# Patient Record
Sex: Male | Born: 1960 | Race: White | Hispanic: No | Marital: Married | Smoking: Never smoker
Health system: Southern US, Community
[De-identification: ages and names within clinical notes are randomized; demographics above are authoritative.]

## PROBLEM LIST (undated history)

## (undated) DIAGNOSIS — Z8616 Personal history of COVID-19: Secondary | ICD-10-CM

## (undated) DIAGNOSIS — I4891 Unspecified atrial fibrillation: Secondary | ICD-10-CM

## (undated) DIAGNOSIS — I10 Essential (primary) hypertension: Secondary | ICD-10-CM

## (undated) DIAGNOSIS — E119 Type 2 diabetes mellitus without complications: Secondary | ICD-10-CM

## (undated) DIAGNOSIS — F419 Anxiety disorder, unspecified: Secondary | ICD-10-CM

## (undated) HISTORY — DX: Personal history of COVID-19: Z86.16

## (undated) HISTORY — DX: Unspecified atrial fibrillation: I48.91

## (undated) HISTORY — DX: Essential (primary) hypertension: I10

## (undated) HISTORY — DX: Type 2 diabetes mellitus without complications: E11.9

---

## 2012-11-02 ENCOUNTER — Ambulatory Visit: Payer: Self-pay | Admitting: *Deleted

## 2012-11-30 ENCOUNTER — Ambulatory Visit: Payer: Self-pay | Admitting: *Deleted

## 2013-01-11 ENCOUNTER — Ambulatory Visit: Payer: Self-pay | Admitting: *Deleted

## 2016-04-18 ENCOUNTER — Other Ambulatory Visit: Payer: Self-pay | Admitting: Gastroenterology

## 2016-04-18 DIAGNOSIS — R945 Abnormal results of liver function studies: Principal | ICD-10-CM

## 2016-04-18 DIAGNOSIS — R7989 Other specified abnormal findings of blood chemistry: Secondary | ICD-10-CM

## 2016-04-22 ENCOUNTER — Ambulatory Visit
Admission: RE | Admit: 2016-04-22 | Discharge: 2016-04-22 | Disposition: A | Discharge status: Home / Self Care | Payer: BLUE CROSS/BLUE SHIELD | Source: Ambulatory Visit | Attending: Gastroenterology | Admitting: Gastroenterology

## 2016-04-22 DIAGNOSIS — R7989 Other specified abnormal findings of blood chemistry: Secondary | ICD-10-CM

## 2016-04-22 DIAGNOSIS — R945 Abnormal results of liver function studies: Principal | ICD-10-CM

## 2016-11-14 ENCOUNTER — Emergency Department (HOSPITAL_BASED_OUTPATIENT_CLINIC_OR_DEPARTMENT_OTHER)
Admission: EM | Admit: 2016-11-14 | Discharge: 2016-11-14 | Disposition: A | Discharge status: Home / Self Care | Payer: BLUE CROSS/BLUE SHIELD | Attending: Emergency Medicine | Admitting: Emergency Medicine

## 2016-11-14 ENCOUNTER — Encounter (HOSPITAL_BASED_OUTPATIENT_CLINIC_OR_DEPARTMENT_OTHER): Payer: Self-pay | Admitting: *Deleted

## 2016-11-14 ENCOUNTER — Emergency Department (HOSPITAL_BASED_OUTPATIENT_CLINIC_OR_DEPARTMENT_OTHER): Payer: BLUE CROSS/BLUE SHIELD

## 2016-11-14 DIAGNOSIS — R079 Chest pain, unspecified: Secondary | ICD-10-CM | POA: Diagnosis not present

## 2016-11-14 DIAGNOSIS — Z79899 Other long term (current) drug therapy: Secondary | ICD-10-CM | POA: Diagnosis not present

## 2016-11-14 HISTORY — DX: Anxiety disorder, unspecified: F41.9

## 2016-11-14 LAB — CBC
HCT: 42.3 % (ref 39.0–52.0)
Hemoglobin: 14.1 g/dL (ref 13.0–17.0)
MCH: 30.1 pg (ref 26.0–34.0)
MCHC: 33.3 g/dL (ref 30.0–36.0)
MCV: 90.4 fL (ref 78.0–100.0)
PLATELETS: 208 10*3/uL (ref 150–400)
RBC: 4.68 MIL/uL (ref 4.22–5.81)
RDW: 13.9 % (ref 11.5–15.5)
WBC: 7.5 10*3/uL (ref 4.0–10.5)

## 2016-11-14 LAB — BASIC METABOLIC PANEL
ANION GAP: 7 (ref 5–15)
BUN: 20 mg/dL (ref 6–20)
CALCIUM: 9.4 mg/dL (ref 8.9–10.3)
CO2: 27 mmol/L (ref 22–32)
CREATININE: 0.9 mg/dL (ref 0.61–1.24)
Chloride: 106 mmol/L (ref 101–111)
GFR calc Af Amer: >60 mL/min (ref >60)
GLUCOSE: 105 mg/dL — AB (ref 65–99)
Potassium: 4.4 mmol/L (ref 3.5–5.1)
Sodium: 140 mmol/L (ref 135–145)

## 2016-11-14 LAB — TROPONIN I

## 2016-11-14 NOTE — ED Notes (Signed)
ED Provider at bedside. 

## 2016-11-14 NOTE — Discharge Instructions (Signed)
You were seen in the emergency room today for evaluation of chest pain. Your workup including bloodwork, chest x-ray, and EKG were unremarkable. Please follow up with your primary care provider and cardiologist. Return to the emergency room for new or worsening symptoms.

## 2016-11-14 NOTE — ED Triage Notes (Signed)
Pt c/o central chest pain x 1 day, denies SOB nausea

## 2016-11-14 NOTE — ED Provider Notes (Signed)
[facility] Provider Note   CSN: [id number] Arrival date & time: 11/14/16  1911  By signing my name below, I, [name], attest that this documentation has been prepared under the direction and in the presence of KeyCorpSerena Aleksandra Raben PA-C.  Electronically Signed: [name], ED Scribe. 11/14/16. 8:43 PM.   History   Chief Complaint Chief Complaint  Patient presents with  . Chest Pain    HPI HPI Comments: Alejandro Bowen is a 55 y.o. male with Hx of anxiety, HLD, who presents to the Emergency Department complaining of central chest pain that started approximately four hours ago. Pt states that his chest pain started while he was leaving work. 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 Pt states that the chest pain has subsided. He has had these pain intermittently in the past. Pt has had a negative stress test done with Dr. Andrey CampanileWilson with [facility] earlier this year. Pt had concerns for his blood pressure when taken by EMS was 176/76. BP has now improved. No shortness of breath during chest pain episode. He does not currently take medication for his blood pressure. No nausea or vomiting. Denies diaphoresis. Denies fam hx of CAD. Denies cigarette use. Denies recent travel or sugery. Denies h/o blood clots or malignancy.  The history is provided by the patient. No language interpreter was used.    Past Medical History:  Diagnosis Date  . Anxiety     There are no active problems to display for this patient.   History reviewed. No pertinent surgical history.     Home Medications    Prior to Admission medications   Medication Sig Start Date End Date Taking? Authorizing Provider  citalopram (CELEXA) 20 MG tablet Take 20 mg by mouth daily.   Yes Historical Provider, MD    Family History History reviewed. No pertinent family history.  Social History Social History  Substance Use Topics  . Smoking status: Never Smoker  . Smokeless  tobacco: Never Used  . Alcohol use No     Allergies   Penicillins   Review of Systems Review of Systems 10 Systems reviewed and all are negative for acute change except as noted in the HPI.    Physical Exam Updated Vital Signs BP 154/92   Pulse 69   Temp 97.9 F (36.6 C)   Resp 18   Ht 6\' 6"  (1.981 m)   Wt 270 lb (122.5 kg)   SpO2 100%   BMI 31.20 kg/m   Physical Exam  Constitutional: He is oriented to person, place, and time. He appears well-developed and well-nourished. No distress.  HENT:  Head: Normocephalic and atraumatic.  Neck: Normal range of motion.  Pulmonary/Chest: Effort normal.  Neurological: He is alert and oriented to person, place, and time.  Skin: Skin is warm and dry. He is not diaphoretic.  Psychiatric: He has a normal mood and affect. Judgment normal.  Nursing note and vitals reviewed.    ED Treatments / Results  DIAGNOSTIC STUDIES: Oxygen Saturation is 100% on RA, normal by my interpretation.  COORDINATION OF CARE: 8:42 PM-Will wait for Troponin result. Discussed treatment plan with pt at bedside and pt agreed to plan.   Labs (all labs ordered are listed, but only abnormal results are displayed) Labs Reviewed  BASIC METABOLIC PANEL - Abnormal; Notable for the following:       Result Value   Glucose, Bld 105 (*)    All other components within normal limits  CBC  TROPONIN I  TROPONIN I    EKG  EKG Interpretation  Date/Time:  Monday November 14 2016 19:17:20 EST Ventricular Rate:  70 PR Interval:  180 QRS Duration: 78 QT Interval:  386 QTC Calculation: 416 R Axis:   55 Text Interpretation:  Normal sinus rhythm Nonspecific T wave abnormality Abnormal ECG agree. no old comparison. Confirmed by Donnald GarrePfeiffer, MD, Lebron ConnersMarcy [phone]([id number]) on 11/14/2016 9:59:00 PM       Radiology Dg Chest 2 View  Result Date: 11/14/2016 CLINICAL DATA:  Central chest pain EXAM: CHEST  2 VIEW COMPARISON:  None. FINDINGS: The heart size and mediastinal contours  are within normal limits. Both lungs are clear. The visualized skeletal structures are unremarkable. IMPRESSION: No active cardiopulmonary disease. Electronically Signed   By: Jasmine PangKim  Fujinaga M.D.   On: 11/14/2016 19:40    Procedures Procedures (including critical care time)  Medications Ordered in ED Medications - No data to display   Initial Impression / Assessment and Plan / ED Course  I have reviewed the triage vital signs and the nursing notes.  Pertinent labs & imaging results that were available during my care of the patient were reviewed by me and considered in my medical decision making (see chart for details).  Clinical Course     Workup unrevealing. Pt has remained pain-free. Delta troponin negative. EKG nonacute. CXR, CBC, and BMP unremarkable. HEART score 2. Doubt ACS. Doubt PE. No risk factors for PE. Pt has a history of this same pain and has had negative recent stress test. Discussed there are many etiologies to chest pain. Encouraged close f/u with his PCP.  Encouraged monitoring BP at home. ER return precautions given.  Final Clinical Impressions(s) / ED Diagnoses   Final diagnoses:  Chest pain, unspecified type    New Prescriptions Discharge Medication List as of 11/14/2016 11:23 PM     I personally performed the services described in this documentation, which was scribed in my presence. The recorded information has been reviewed and is accurate.    Carlene CoriaSerena Y Sheniah Supak, PA-C 11/15/16 [id number]    Arby BarretteMarcy Pfeiffer, MD 11/17/16 2351

## 2020-11-27 ENCOUNTER — Encounter (HOSPITAL_COMMUNITY): Payer: Self-pay

## 2020-11-27 ENCOUNTER — Other Ambulatory Visit: Payer: Self-pay

## 2020-11-27 ENCOUNTER — Inpatient Hospital Stay (HOSPITAL_COMMUNITY)
Admission: EM | Admit: 2020-11-27 | Discharge: 2020-12-09 | DRG: 177 | Disposition: A | Discharge status: Home / Self Care | Payer: BC Managed Care – PPO | Attending: Internal Medicine | Admitting: Internal Medicine

## 2020-11-27 ENCOUNTER — Emergency Department (HOSPITAL_COMMUNITY): Payer: BC Managed Care – PPO

## 2020-11-27 DIAGNOSIS — J9601 Acute respiratory failure with hypoxia: Secondary | ICD-10-CM | POA: Diagnosis present

## 2020-11-27 DIAGNOSIS — E119 Type 2 diabetes mellitus without complications: Secondary | ICD-10-CM

## 2020-11-27 DIAGNOSIS — E669 Obesity, unspecified: Secondary | ICD-10-CM | POA: Diagnosis present

## 2020-11-27 DIAGNOSIS — T380X5A Adverse effect of glucocorticoids and synthetic analogues, initial encounter: Secondary | ICD-10-CM | POA: Diagnosis not present

## 2020-11-27 DIAGNOSIS — E1165 Type 2 diabetes mellitus with hyperglycemia: Secondary | ICD-10-CM | POA: Diagnosis not present

## 2020-11-27 DIAGNOSIS — Z880 Allergy status to penicillin: Secondary | ICD-10-CM

## 2020-11-27 DIAGNOSIS — I4891 Unspecified atrial fibrillation: Secondary | ICD-10-CM | POA: Diagnosis not present

## 2020-11-27 DIAGNOSIS — E875 Hyperkalemia: Secondary | ICD-10-CM | POA: Diagnosis not present

## 2020-11-27 DIAGNOSIS — F419 Anxiety disorder, unspecified: Secondary | ICD-10-CM | POA: Diagnosis present

## 2020-11-27 DIAGNOSIS — J1282 Pneumonia due to coronavirus disease 2019: Secondary | ICD-10-CM | POA: Diagnosis present

## 2020-11-27 DIAGNOSIS — U071 COVID-19: Principal | ICD-10-CM | POA: Diagnosis present

## 2020-11-27 DIAGNOSIS — E11649 Type 2 diabetes mellitus with hypoglycemia without coma: Secondary | ICD-10-CM | POA: Diagnosis not present

## 2020-11-27 DIAGNOSIS — Z23 Encounter for immunization: Secondary | ICD-10-CM

## 2020-11-27 DIAGNOSIS — Z79899 Other long term (current) drug therapy: Secondary | ICD-10-CM

## 2020-11-27 DIAGNOSIS — I10 Essential (primary) hypertension: Secondary | ICD-10-CM | POA: Diagnosis present

## 2020-11-27 DIAGNOSIS — Z6831 Body mass index (BMI) 31.0-31.9, adult: Secondary | ICD-10-CM

## 2020-11-27 DIAGNOSIS — Z888 Allergy status to other drugs, medicaments and biological substances status: Secondary | ICD-10-CM

## 2020-11-27 LAB — BASIC METABOLIC PANEL
Anion gap: 13 (ref 5–15)
BUN: 18 mg/dL (ref 6–20)
CO2: 22 mmol/L (ref 22–32)
Calcium: 9.2 mg/dL (ref 8.9–10.3)
Chloride: 101 mmol/L (ref 98–111)
Creatinine, Ser: 0.93 mg/dL (ref 0.61–1.24)
GFR, Estimated: >60 mL/min (ref >60)
Glucose, Bld: 166 mg/dL — ABNORMAL HIGH (ref 70–99)
Potassium: 3.8 mmol/L (ref 3.5–5.1)
Sodium: 136 mmol/L (ref 135–145)

## 2020-11-27 LAB — CBC
HCT: 46.9 % (ref 39.0–52.0)
Hemoglobin: 15.2 g/dL (ref 13.0–17.0)
MCH: 30.3 pg (ref 26.0–34.0)
MCHC: 32.4 g/dL (ref 30.0–36.0)
MCV: 93.6 fL (ref 80.0–100.0)
Platelets: 145 10*3/uL — ABNORMAL LOW (ref 150–400)
RBC: 5.01 MIL/uL (ref 4.22–5.81)
RDW: 13.2 % (ref 11.5–15.5)
WBC: 5 10*3/uL (ref 4.0–10.5)
nRBC: 0 % (ref 0.0–0.2)

## 2020-11-27 LAB — TROPONIN I (HIGH SENSITIVITY)
Troponin I (High Sensitivity): 6 ng/L (ref <18)
Troponin I (High Sensitivity): 7 ng/L (ref <18)

## 2020-11-27 MED ORDER — ACETAMINOPHEN 500 MG PO TABS
1000.0000 mg | ORAL_TABLET | Freq: Once | ORAL | Status: AC
  Administered 2020-11-27: 1000 mg via ORAL
  Filled 2020-11-27: qty 2

## 2020-11-27 NOTE — ED Triage Notes (Signed)
Pt tested positive for COVID on Monday. Reports chest pain, no SOB, pt has not been vaccinated for COVID. Pt a.o, resp e.u

## 2020-11-28 ENCOUNTER — Inpatient Hospital Stay (HOSPITAL_COMMUNITY): Payer: BC Managed Care – PPO

## 2020-11-28 ENCOUNTER — Encounter (HOSPITAL_COMMUNITY): Payer: Self-pay | Admitting: Student

## 2020-11-28 DIAGNOSIS — U071 COVID-19: Secondary | ICD-10-CM | POA: Diagnosis present

## 2020-11-28 DIAGNOSIS — E119 Type 2 diabetes mellitus without complications: Secondary | ICD-10-CM | POA: Diagnosis not present

## 2020-11-28 DIAGNOSIS — Z79899 Other long term (current) drug therapy: Secondary | ICD-10-CM | POA: Diagnosis not present

## 2020-11-28 DIAGNOSIS — E875 Hyperkalemia: Secondary | ICD-10-CM | POA: Diagnosis not present

## 2020-11-28 DIAGNOSIS — I10 Essential (primary) hypertension: Secondary | ICD-10-CM | POA: Diagnosis present

## 2020-11-28 DIAGNOSIS — E1165 Type 2 diabetes mellitus with hyperglycemia: Secondary | ICD-10-CM | POA: Diagnosis not present

## 2020-11-28 DIAGNOSIS — Z6831 Body mass index (BMI) 31.0-31.9, adult: Secondary | ICD-10-CM | POA: Diagnosis not present

## 2020-11-28 DIAGNOSIS — F419 Anxiety disorder, unspecified: Secondary | ICD-10-CM | POA: Diagnosis present

## 2020-11-28 DIAGNOSIS — J1282 Pneumonia due to coronavirus disease 2019: Secondary | ICD-10-CM | POA: Diagnosis present

## 2020-11-28 DIAGNOSIS — Z23 Encounter for immunization: Secondary | ICD-10-CM | POA: Diagnosis not present

## 2020-11-28 DIAGNOSIS — J9601 Acute respiratory failure with hypoxia: Secondary | ICD-10-CM | POA: Diagnosis present

## 2020-11-28 DIAGNOSIS — T380X5A Adverse effect of glucocorticoids and synthetic analogues, initial encounter: Secondary | ICD-10-CM | POA: Diagnosis not present

## 2020-11-28 DIAGNOSIS — I4891 Unspecified atrial fibrillation: Secondary | ICD-10-CM | POA: Diagnosis not present

## 2020-11-28 DIAGNOSIS — E669 Obesity, unspecified: Secondary | ICD-10-CM | POA: Diagnosis present

## 2020-11-28 DIAGNOSIS — E11649 Type 2 diabetes mellitus with hypoglycemia without coma: Secondary | ICD-10-CM | POA: Diagnosis not present

## 2020-11-28 DIAGNOSIS — Z888 Allergy status to other drugs, medicaments and biological substances status: Secondary | ICD-10-CM | POA: Diagnosis not present

## 2020-11-28 DIAGNOSIS — Z880 Allergy status to penicillin: Secondary | ICD-10-CM | POA: Diagnosis not present

## 2020-11-28 LAB — CBG MONITORING, ED
Glucose-Capillary: 227 mg/dL — ABNORMAL HIGH (ref 70–99)
Glucose-Capillary: 248 mg/dL — ABNORMAL HIGH (ref 70–99)
Glucose-Capillary: 262 mg/dL — ABNORMAL HIGH (ref 70–99)
Glucose-Capillary: 254 mg/dL — ABNORMAL HIGH (ref 70–99)
Glucose-Capillary: 275 mg/dL — ABNORMAL HIGH (ref 70–99)

## 2020-11-28 LAB — HEPATIC FUNCTION PANEL
ALT: 80 U/L — ABNORMAL HIGH (ref 0–44)
AST: 78 U/L — ABNORMAL HIGH (ref 15–41)
Albumin: 3.2 g/dL — ABNORMAL LOW (ref 3.5–5.0)
Alkaline Phosphatase: 67 U/L (ref 38–126)
Bilirubin, Direct: 0.3 mg/dL — ABNORMAL HIGH (ref 0.0–0.2)
Indirect Bilirubin: 0.6 mg/dL (ref 0.3–0.9)
Total Bilirubin: 0.9 mg/dL (ref 0.3–1.2)
Total Protein: 6.6 g/dL (ref 6.5–8.1)

## 2020-11-28 LAB — LACTATE DEHYDROGENASE: LDH: 403 U/L — ABNORMAL HIGH (ref 98–192)

## 2020-11-28 LAB — D-DIMER, QUANTITATIVE: D-Dimer, Quant: 0.95 ug/mL-FEU — ABNORMAL HIGH (ref 0.00–0.50)

## 2020-11-28 LAB — FERRITIN: Ferritin: 1757 ng/mL — ABNORMAL HIGH (ref 24–336)

## 2020-11-28 LAB — PROCALCITONIN: Procalcitonin: 0.43 ng/mL

## 2020-11-28 LAB — LACTIC ACID, PLASMA: Lactic Acid, Venous: 1.9 mmol/L (ref 0.5–1.9)

## 2020-11-28 LAB — TRIGLYCERIDES: Triglycerides: 95 mg/dL (ref <150)

## 2020-11-28 LAB — FIBRINOGEN: Fibrinogen: 567 mg/dL — ABNORMAL HIGH (ref 210–475)

## 2020-11-28 LAB — C-REACTIVE PROTEIN: CRP: 6.6 mg/dL — ABNORMAL HIGH (ref <1.0)

## 2020-11-28 MED ORDER — DEXAMETHASONE SODIUM PHOSPHATE 10 MG/ML IJ SOLN
6.0000 mg | Freq: Once | INTRAMUSCULAR | Status: AC
  Administered 2020-11-28: 6 mg via INTRAVENOUS
  Filled 2020-11-28: qty 1

## 2020-11-28 MED ORDER — FENTANYL CITRATE (PF) 100 MCG/2ML IJ SOLN
50.0000 ug | Freq: Once | INTRAMUSCULAR | Status: DC
  Filled 2020-11-28: qty 2

## 2020-11-28 MED ORDER — METHYLPREDNISOLONE SODIUM SUCC 125 MG IJ SOLR
80.0000 mg | Freq: Three times a day (TID) | INTRAMUSCULAR | Status: DC
  Administered 2020-11-28 – 2020-12-03 (×16): 80 mg via INTRAVENOUS
  Filled 2020-11-28 (×15): qty 2

## 2020-11-28 MED ORDER — SODIUM CHLORIDE 0.9 % IV SOLN: 2.0000 g | Freq: Every day | INTRAVENOUS | Status: DC

## 2020-11-28 MED ORDER — CARVEDILOL 6.25 MG PO TABS
6.2500 mg | ORAL_TABLET | Freq: Two times a day (BID) | ORAL | Status: DC
  Administered 2020-11-28 – 2020-12-06 (×18): 6.25 mg via ORAL
  Filled 2020-11-28 (×4): qty 1
  Filled 2020-11-28: qty 2
  Filled 2020-11-28 (×13): qty 1

## 2020-11-28 MED ORDER — SODIUM CHLORIDE 0.9 % IV SOLN
500.0000 mg | Freq: Every day | INTRAVENOUS | Status: AC
  Administered 2020-11-28 – 2020-12-02 (×5): 500 mg via INTRAVENOUS
  Filled 2020-11-28 (×6): qty 500

## 2020-11-28 MED ORDER — ASCORBIC ACID 500 MG PO TABS
500.0000 mg | ORAL_TABLET | Freq: Every day | ORAL | Status: DC
  Administered 2020-11-28 – 2020-12-09 (×11): 500 mg via ORAL
  Filled 2020-11-28 (×11): qty 1

## 2020-11-28 MED ORDER — SODIUM CHLORIDE 0.9 % IV SOLN: INTRAVENOUS | Status: DC

## 2020-11-28 MED ORDER — IPRATROPIUM-ALBUTEROL 20-100 MCG/ACT IN AERS
1.0000 | INHALATION_SPRAY | Freq: Four times a day (QID) | RESPIRATORY_TRACT | Status: DC
  Administered 2020-11-28 – 2020-12-09 (×40): 1 via RESPIRATORY_TRACT
  Filled 2020-11-28: qty 4

## 2020-11-28 MED ORDER — METHYLPREDNISOLONE SODIUM SUCC 125 MG IJ SOLR
125.0000 mg | Freq: Two times a day (BID) | INTRAMUSCULAR | Status: DC
  Administered 2020-11-28: 125 mg via INTRAVENOUS
  Filled 2020-11-28: qty 2

## 2020-11-28 MED ORDER — CITALOPRAM HYDROBROMIDE 20 MG PO TABS
20.0000 mg | ORAL_TABLET | Freq: Every day | ORAL | Status: DC
  Administered 2020-11-28 – 2020-12-08 (×11): 20 mg via ORAL
  Filled 2020-11-28: qty 1
  Filled 2020-11-28: qty 2
  Filled 2020-11-28 (×2): qty 1
  Filled 2020-11-28: qty 2
  Filled 2020-11-28 (×2): qty 1
  Filled 2020-11-28: qty 2
  Filled 2020-11-28 (×3): qty 1

## 2020-11-28 MED ORDER — ZINC SULFATE 220 (50 ZN) MG PO CAPS
220.0000 mg | ORAL_CAPSULE | Freq: Every day | ORAL | Status: DC
  Administered 2020-11-28 – 2020-12-09 (×12): 220 mg via ORAL
  Filled 2020-11-28 (×12): qty 1

## 2020-11-28 MED ORDER — SODIUM CHLORIDE 0.9 % IV SOLN
100.0000 mg | Freq: Every day | INTRAVENOUS | Status: AC
  Administered 2020-11-29 – 2020-12-02 (×4): 100 mg via INTRAVENOUS
  Filled 2020-11-28 (×5): qty 20

## 2020-11-28 MED ORDER — PANTOPRAZOLE SODIUM 40 MG PO TBEC
40.0000 mg | DELAYED_RELEASE_TABLET | Freq: Every day | ORAL | Status: DC
  Administered 2020-11-28 – 2020-12-09 (×12): 40 mg via ORAL
  Filled 2020-11-28 (×12): qty 1

## 2020-11-28 MED ORDER — BARICITINIB 2 MG PO TABS
4.0000 mg | ORAL_TABLET | Freq: Every day | ORAL | Status: DC
  Administered 2020-11-28 – 2020-12-09 (×12): 4 mg via ORAL
  Filled 2020-11-28 (×12): qty 2

## 2020-11-28 MED ORDER — PREDNISONE 20 MG PO TABS: 50.0000 mg | ORAL_TABLET | Freq: Every day | ORAL | Status: DC

## 2020-11-28 MED ORDER — IOHEXOL 350 MG/ML SOLN
75.0000 mL | Freq: Once | INTRAVENOUS | Status: AC | PRN
  Administered 2020-11-28: 75 mL via INTRAVENOUS

## 2020-11-28 MED ORDER — SODIUM CHLORIDE 0.9 % IV SOLN
200.0000 mg | Freq: Once | INTRAVENOUS | Status: AC
  Administered 2020-11-28: 200 mg via INTRAVENOUS
  Filled 2020-11-28: qty 40

## 2020-11-28 MED ORDER — LISINOPRIL 5 MG PO TABS
5.0000 mg | ORAL_TABLET | Freq: Every day | ORAL | Status: DC
  Administered 2020-11-28 – 2020-12-03 (×6): 5 mg via ORAL
  Filled 2020-11-28 (×6): qty 1

## 2020-11-28 MED ORDER — GUAIFENESIN-DM 100-10 MG/5ML PO SYRP
10.0000 mL | ORAL_SOLUTION | ORAL | Status: DC | PRN
  Administered 2020-12-01: 10 mL via ORAL
  Filled 2020-11-28 (×2): qty 10

## 2020-11-28 MED ORDER — INSULIN ASPART 100 UNIT/ML ~~LOC~~ SOLN
0.0000 [IU] | Freq: Three times a day (TID) | SUBCUTANEOUS | Status: DC
  Administered 2020-11-28 (×2): 3 [IU] via SUBCUTANEOUS
  Administered 2020-11-28: 5 [IU] via SUBCUTANEOUS
  Administered 2020-11-29: 7 [IU] via SUBCUTANEOUS
  Administered 2020-11-29 (×2): 5 [IU] via SUBCUTANEOUS
  Administered 2020-11-30: 9 [IU] via SUBCUTANEOUS
  Administered 2020-11-30 (×2): 5 [IU] via SUBCUTANEOUS
  Administered 2020-12-01 (×2): 3 [IU] via SUBCUTANEOUS
  Administered 2020-12-01: 5 [IU] via SUBCUTANEOUS

## 2020-11-28 MED ORDER — ENOXAPARIN SODIUM 60 MG/0.6ML ~~LOC~~ SOLN
60.0000 mg | Freq: Every day | SUBCUTANEOUS | Status: DC
  Administered 2020-11-28 – 2020-11-30 (×3): 60 mg via SUBCUTANEOUS
  Filled 2020-11-28 (×3): qty 0.6

## 2020-11-28 MED ORDER — SODIUM CHLORIDE 0.9 % IV SOLN
2.0000 g | INTRAVENOUS | Status: AC
  Administered 2020-11-28 – 2020-12-02 (×5): 2 g via INTRAVENOUS
  Filled 2020-11-28 (×5): qty 20

## 2020-11-28 MED ORDER — ACETAMINOPHEN 500 MG PO TABS
1000.0000 mg | ORAL_TABLET | Freq: Once | ORAL | Status: AC
  Administered 2020-11-28: 1000 mg via ORAL
  Filled 2020-11-28: qty 2

## 2020-11-28 MED ORDER — VITAMIN D 25 MCG (1000 UNIT) PO TABS
1000.0000 [IU] | ORAL_TABLET | Freq: Every day | ORAL | Status: DC
  Administered 2020-11-28 – 2020-12-09 (×12): 1000 [IU] via ORAL
  Filled 2020-11-28 (×12): qty 1

## 2020-11-28 MED ORDER — ADULT MULTIVITAMIN W/MINERALS CH
1.0000 | ORAL_TABLET | Freq: Every day | ORAL | Status: DC
  Administered 2020-11-28 – 2020-12-09 (×12): 1 via ORAL
  Filled 2020-11-28 (×12): qty 1

## 2020-11-28 NOTE — ED Notes (Signed)
Breakfast Ordered 

## 2020-11-28 NOTE — H&P (Addendum)
History and Physical  Alejandro Bowen EHU:[id number] DOB: 08-25-1961 DOA: 11/27/2020  Referring physician: Micheal Likens, PA, EDP PCP: [name], MD  Outpatient Specialists: None Patient coming from: Home.  Chief Complaint: Shortness of breath and chest pain, positive COVID-19 test 11/23/20.  HPI: Alejandro Bowen is a 59 y.o. male with medical history significant for obesity, chronic anxiety, essential hypertension, COVID-19 positive test on 11/23/2020 who presented to [facility] ED with complaints of shortness of breath and chest pain worse with taking a breath of 3 day duration, gradually worsening.  Associated with mild non productive cough, intermittent subjective fevers, decreased appetite, and fatigue.  He denies, abdominal pain, nausea, vomiting, diarrhea, lower extremity edema or lower extremity pain.  No loss of taste or loss of smell.  Unvaccinated for Covid-19 virus.  Before thanksgivings, he was exposed to someone at work who tested positive.  Hypoxic in the ED with O2 saturation in the mid 80s on room air at rest.  Chest x-ray consistent with COVID-19 viral pneumonia.  Started on COVID-19 directed therapies in the ED, the patient gave consent to use Remdesivir and Baricitinib.  TRH asked to admit.  ED Course: T-max 102.7, respiratory rate 34.  Elevated inflammatory markers, CRP 6.6, D-dimer 0.9. Mildly elevated LFTs.  Review of Systems: Review of systems as noted in the HPI. All other systems reviewed and are negative.   Past Medical History:  Diagnosis Date  . Anxiety    History reviewed. No pertinent surgical history.  Social History:  reports that he has never smoked. He has never used smokeless tobacco. He reports that he does not drink alcohol and does not use drugs.   Allergies  Allergen Reactions  . Alprazolam Other (See Comments)    depression  . Penicillins Hives    Family history: Brother was also diagnosed with covid 19 infection.  Prior to Admission medications    Medication Sig Start Date End Date Taking? Authorizing Provider  carvedilol (COREG) 25 MG tablet Take 25 mg by mouth 2 (two) times daily. 11/07/20  Yes [provider]  citalopram (CELEXA) 20 MG tablet Take 20 mg by mouth daily.   Yes [provider]  ibuprofen (ADVIL) 200 MG tablet Take 600 mg by mouth every 6 (six) hours as needed for fever, headache or mild pain.   Yes [provider]  lisinopril (ZESTRIL) 10 MG tablet Take 10 mg by mouth daily. 11/07/20  Yes [provider]    Physical Exam: BP 121/68   Pulse 74   Temp 99.9 F (37.7 C) (Oral)   Resp (!) 29   Ht 6\' 6"  (1.981 m)   Wt 122.5 kg   SpO2 92%   BMI 31.20 kg/m   . General: 59 y.o. year-old male well developed well nourished in no acute distress.  Alert and oriented x3. . Cardiovascular: Regular rate and rhythm with no rubs or gallops.  No thyromegaly or JVD noted.  No lower extremity edema. 2/4 pulses in all 4 extremities. 59 Respiratory: Mild rales at bases with no wheezes. Good inspiratory effort. . Abdomen: Soft nontender nondistended with normal bowel sounds x4 quadrants. . Muskuloskeletal: No cyanosis, clubbing or edema noted bilaterally . Neuro: CN II-XII intact, strength, sensation, reflexes . Skin: No ulcerative lesions noted or rashes . Psychiatry: Judgement and insight appear normal. Mood is appropriate for condition and setting          Labs on Admission:  Basic Metabolic Panel: Recent Labs  Lab 11/27/20 1732  NA 136  K 3.8  CL 101  CO2 22  GLUCOSE 166*  BUN 18  CREATININE 0.93  CALCIUM 9.2   Liver Function Tests: Recent Labs  Lab 11/28/20 0240  AST 78*  ALT 80*  ALKPHOS 67  BILITOT 0.9  PROT 6.6  ALBUMIN 3.2*   No results for input(s): LIPASE, AMYLASE in the last 168 hours. No results for input(s): AMMONIA in the last 168 hours. CBC: Recent Labs  Lab 11/27/20 1732  WBC 5.0  HGB 15.2  HCT 46.9  MCV 93.6  PLT 145*   Cardiac Enzymes: No  results for input(s): CKTOTAL, CKMB, CKMBINDEX, TROPONINI in the last 168 hours.  BNP (last 3 results) No results for input(s): BNP in the last 8760 hours.  ProBNP (last 3 results) No results for input(s): PROBNP in the last 8760 hours.  CBG: No results for input(s): GLUCAP in the last 168 hours.  Radiological Exams on Admission: DG Chest Portable 1 View  Result Date: 11/27/2020 CLINICAL DATA:  COVID-19 positive, chest pain EXAM: PORTABLE CHEST 1 VIEW COMPARISON:  Radiograph 11/14/2016 FINDINGS: Heterogeneous mixed consolidative and interstitial opacities present the mid to lower lungs in a slight peripheral predominance compatible reported imaging features of COVID 19 pneumonia. No pneumothorax. No effusion. The cardiomediastinal contours are unremarkable for portable technique. No acute osseous or soft tissue abnormality. IMPRESSION: Appearance compatible with a multifocal pneumonia in the setting of COVID 19. Electronically Signed   By: Kreg Shropshire M.D.   On: 11/27/2020 18:26    EKG: I independently viewed the EKG done and my findings are as followed: Normal sinus rhythm rate of 77.  No specific ST-T changes.  Assessment/Plan Present on Admission: . Pneumonia due to COVID-19 virus  Active Problems:   Pneumonia due to COVID-19 virus  COVID-19 viral pneumonia Presented with dyspnea and pleuritic chest pain, worse with taking a breath Febrile with T-max 102.7, respiratory rate 34, positive COVID-19 test on 11/23/2020 Elevated inflammatory markers, trend Unvaccinated against Covid-19 viral infection Independently viewed chest x-ray 11/27/20 which showed multilobular bilateral pulmonary infiltrates consistent with COVID-19 viral pneumonia. Started COVID-19 directed therapies, continue Added p.o. baricitinib daily x14 days Continue IV remdesivir started in the ED x5 days Bronchodilators every 6 hours Vitamin C, D3 and zinc Incentive spirometer and flutter valve Pronate as tolerated  or side sleeping  Pleuritic chest pain in the setting of COVID-19 viral pneumonia Troponin is negative x2 D-dimer 0.95 Follow CTA chest ordered by EDP 12/4 to rule out pulmonary embolism Gentle IV fluid hydration, normal saline at 50 cc/h x 1 day, to avoid contrast-induced nephropathy Use incentive spirometer and flutter valve to avoid pulmonary edema, atelectasis, or pleural effusions  Acute hypoxic respiratory failure secondary to COVID-19 viral pneumonia Not on oxygen supplementation at baseline Currently on 2 L to maintain O2 saturation greater than 90% Presented with hypoxia, O2 saturation 86% on room air.  Essential hypertension BP is currently at goal Resume home regimen at lower doses to avoid hypotension Monitor vital signs  Obesity BMI 31 Recommend weight loss outpatient with regular physical activity and healthy dieting.  Chronic anxiety Resume home Celexa   DVT prophylaxis: Subcu Lovenox daily  Code Status: Full code, per the patient himself.  Family Communication: None at bedside.  Disposition Plan: Admit to telemetry medical  Consults called: None  Admission status: Inpatient status.  Patient will require at least 2 midnights for further evaluation and treatment of present condition.   Status is: Inpatient    Dispo:  Patient From:  Home  Planned Disposition: Home  Expected discharge date: 11/30/20  Medically stable for discharge: No, ongoing management of COVID-19 viral pneumonia and acute hypoxic respiratory failure.        Darlin Drop MD [facility] Pager [phone]  If 7PM-7AM, please contact night-coverage [email or web address] Password [email or web address]  11/28/2020, 4:52 AM

## 2020-11-28 NOTE — ED Provider Notes (Signed)
[facility] EMERGENCY DEPARTMENT Provider Note   CSN: [id number] Arrival date & time: 11/27/20  1709     History Chief Complaint  Patient presents with  . Covid Positive  . Chest Pain    Alejandro Bowen is a 59 y.o. male with a hx of anxiety & hypertension who presents to the ED with complaints of chest pain in the setting of testing positive for COVID 19. Patient states he has felt ill for 1 week now with sxs including intermittent fevers, decreased appetite, dyspnea & chest pain. Patient states chest pain is only with deep breathing, it is worse with activity as he has to breathe more frequently, no specific alleviating factors. Tested positive for COVID 19 11/23/20. He has not received the COVID vaccine. He denies cough, abdominal pain, leg pain/swelling, hemoptysis, recent surgery/trauma, recent long travel, hormone use, cancer requiring chemoradiation, or hx of DVT/PE.    HPI     Past Medical History:  Diagnosis Date  . Anxiety     There are no problems to display for this patient.   History reviewed. No pertinent surgical history.     No family history on file.  Social History   Tobacco Use  . Smoking status: Never Smoker  . Smokeless tobacco: Never Used  Substance Use Topics  . Alcohol use: No  . Drug use: No    Home Medications Prior to Admission medications   Medication Sig Start Date End Date Taking? Authorizing Provider  citalopram (CELEXA) 20 MG tablet Take 20 mg by mouth daily.    [provider]    Allergies    Penicillins  Review of Systems   Review of Systems  Constitutional: Positive for appetite change and fever.  HENT: Negative for congestion, ear pain and sore throat.   Respiratory: Positive for shortness of breath. Negative for cough.   Cardiovascular: Positive for chest pain. Negative for leg swelling.  Gastrointestinal: Negative for abdominal pain.  Neurological: Negative for syncope.  All other systems  reviewed and are negative.   Physical Exam Updated Vital Signs BP (!) 147/77 (BP Location: Right Arm)   Pulse 82   Temp (!) 101.2 F (38.4 C) (Oral)   Resp 18   Ht 6\' 6"  (1.981 m)   Wt 122.5 kg   SpO2 (!) 88%   BMI 31.20 kg/m   Physical Exam Vitals and nursing note reviewed.  Constitutional:      General: He is not in acute distress.    Appearance: He is well-developed. He is not toxic-appearing.  HENT:     Head: Normocephalic and atraumatic.  Eyes:     General:        Right eye: No discharge.        Left eye: No discharge.     Conjunctiva/sclera: Conjunctivae normal.  Cardiovascular:     Rate and Rhythm: Normal rate and regular rhythm.     Pulses:          Radial pulses are 2+ on the right side and 2+ on the left side.  Pulmonary:     Effort: No respiratory distress.     Breath sounds: Normal breath sounds. No wheezing, rhonchi or rales.     Comments: SpO2 86% on RA, improved to low 90s on 2L via  Chest:     Chest wall: No tenderness.  Abdominal:     General: There is no distension.     Palpations: Abdomen is soft.     Tenderness: There  is no abdominal tenderness.  Musculoskeletal:     Cervical back: Neck supple.     Right lower leg: No edema.     Left lower leg: No edema.  Skin:    General: Skin is warm and dry.     Findings: No rash.  Neurological:     Mental Status: He is alert.     Comments: Clear speech.   Psychiatric:        Behavior: Behavior normal.     ED Results / Procedures / Treatments   Labs (all labs ordered are listed, but only abnormal results are displayed) Labs Reviewed  BASIC METABOLIC PANEL - Abnormal; Notable for the following components:      Result Value   Glucose, Bld 166 (*)    All other components within normal limits  CBC - Abnormal; Notable for the following components:   Platelets 145 (*)    All other components within normal limits  CULTURE, BLOOD (ROUTINE X 2)  CULTURE, BLOOD (ROUTINE X 2)  LACTIC ACID, PLASMA   LACTIC ACID, PLASMA  D-DIMER, QUANTITATIVE (NOT AT [facility])  PROCALCITONIN  LACTATE DEHYDROGENASE  FERRITIN  TRIGLYCERIDES  FIBRINOGEN  C-REACTIVE PROTEIN  HEPATIC FUNCTION PANEL  TROPONIN I (HIGH SENSITIVITY)  TROPONIN I (HIGH SENSITIVITY)    EKG None   EKG Interpretation  Date/Time: 11/27/20 @ 17:08   Ventricular Rate:  77bpm   PR Interval: 160 ms   QRS Duration: 74   QT Interval: 354   QTC Calculation: 400       Text Interpretation:NSR, nonspecific t wave changes     Radiology DG Chest Portable 1 View  Result Date: 11/27/2020 CLINICAL DATA:  COVID-19 positive, chest pain EXAM: PORTABLE CHEST 1 VIEW COMPARISON:  Radiograph 11/14/2016 FINDINGS: Heterogeneous mixed consolidative and interstitial opacities present the mid to lower lungs in a slight peripheral predominance compatible reported imaging features of COVID 19 pneumonia. No pneumothorax. No effusion. The cardiomediastinal contours are unremarkable for portable technique. No acute osseous or soft tissue abnormality. IMPRESSION: Appearance compatible with a multifocal pneumonia in the setting of COVID 19. Electronically Signed   By: Kreg Shropshire M.D.   On: 11/27/2020 18:26    Procedures .Critical Care Performed by: Cherly Anderson, PA-C Authorized by: Cherly Anderson, PA-C     CRITICAL CARE Performed by: Harvie Heck   Total critical care time: 30 minutes  Critical care time was exclusive of separately billable procedures and treating other patients.  Critical care was necessary to treat or prevent imminent or life-threatening deterioration.  Critical care was time spent personally by me on the following activities: development of treatment plan with patient and/or surrogate as well as nursing, discussions with consultants, evaluation of patient's response to treatment, examination of patient, obtaining history from patient or surrogate, ordering and performing treatments and interventions,  ordering and review of laboratory studies, ordering and review of radiographic studies, pulse oximetry and re-evaluation of patient's condition. (including critical care time)  Medications Ordered in ED Medications  fentaNYL (SUBLIMAZE) injection 50 mcg (has no administration in time range)  dexamethasone (DECADRON) injection 6 mg (has no administration in time range)  acetaminophen (TYLENOL) tablet 1,000 mg (has no administration in time range)  acetaminophen (TYLENOL) tablet 1,000 mg (1,000 mg Oral Given 11/27/20 1724)    ED Course  I have reviewed the triage vital signs and the nursing notes.  Pertinent labs & imaging results that were available during my care of the patient were reviewed by me and  considered in my medical decision making (see chart for details).    MDM Rules/Calculators/A&P                          Patient presents to the ED with complaints of chest pain in setting of COVID 19- on day 8 of sxs.  Nontoxic, febrile, BP elevated at times- low suspicion for HTN emergency. On my exam patient is hypoxic, persistently saturating 86-89% on RA- applied 2L via [place] with improvement.   Additional history obtained:  Additional history obtained from chart review & nursing note review.   EKG: No STEMI Lab Tests:  I reviewed & interpreted labs ordered by triage, which included:  CBC: Mild thrombocytopenia.  BMP: Hyperglycemia Troponins: flat  Additional inflammatory markers & blood cultures ordered.  Inflammatory markers starting to come back elevated some.   Imaging Studies ordered:  CXR ordered by triage, I independently visualized and interpreted imaging which showed  Appearance compatible with a multifocal pneumonia in the setting of COVID 19.   Given acute hypoxic respiratory failure in the setting of COVID 19 patient will require admission. Remdesevir & decadron ordered. Will also obtain CTA to further evaluate for PE given his pleuritic chest pain. Plan to discuss w/  hospitalist service for admission.   04:40: CONSULT: Discussed with hospitalist Dr. Margo Aye- accepts admission.   Alejandro Bowen was evaluated in Emergency Department on 11/28/2020 for the symptoms described in the history of present illness. He/she was evaluated in the context of the global COVID-19 pandemic, which necessitated consideration that the patient might be at risk for infection with the SARS-CoV-2 virus that causes COVID-19. Institutional protocols and algorithms that pertain to the evaluation of patients at risk for COVID-19 are in a state of rapid change based on information released by regulatory bodies including the CDC and federal and state organizations. These policies and algorithms were followed during the patient's care in the ED.  Portions of this note were generated with Scientist, clinical (histocompatibility and immunogenetics). Dictation errors may occur despite best attempts at proofreading.  Final Clinical Impression(s) / ED Diagnoses Final diagnoses:  Acute hypoxemic respiratory failure [facility])  COVID-19    Rx / DC Orders ED Discharge Orders    None       Cherly Anderson, PA-C 11/28/20 0448    Nira Conn, MD 11/28/20 2112

## 2020-11-28 NOTE — Progress Notes (Signed)
PROGRESS NOTE                                                                             PROGRESS NOTE                                                                                                                                                                                                             Patient Demographics:    Alejandro Bowen, is a 59 y.o. male, DOB - [birth year], VUD:[id number]  Outpatient Primary MD for the patient is [name], MD    LOS - 0  Admit date - 11/27/2020    Chief Complaint  Patient presents with  . Covid Positive  . Chest Pain       Brief Narrative    This is similar charge note as patient was seen and admitted earlier today, chart, imaging and labs were reviewed, patient was seen and examined.  HPI: Alejandro Bowen is a 59 y.o. male with medical history significant for obesity, chronic anxiety, essential hypertension, COVID-19 positive test on 11/23/2020 who presented to [facility] ED with complaints of shortness of breath and chest pain worse with taking a breath of 3 day duration, gradually worsening.  Associated with mild non productive cough, intermittent subjective fevers, decreased appetite, and fatigue.  He denies, abdominal pain, nausea, vomiting, diarrhea, lower extremity edema or lower extremity pain.  No loss of taste or loss of smell.  Unvaccinated for Covid-19 virus.  Before thanksgivings, he was exposed to someone at work who tested positive.  Hypoxic in the ED with O2 saturation in the mid 80s on room air at rest.  Chest x-ray consistent with COVID-19 viral pneumonia.  Started on COVID-19 directed therapies in the ED, the patient gave consent to use Remdesivir and Baricitinib.  TRH asked to admit.  ED Course: T-max 102.7, respiratory rate 34.  Elevated inflammatory markers, CRP 6.6, D-dimer 0.9. Mildly elevated LFTs.    Subjective:    Dameer Speiser today has any nausea or vomiting, reports dyspnea on cough  .  Assessment  & Plan :    Active Problems:   Pneumonia due to COVID-19 virus  Acute Hypoxic Resp. Failure due to Acute Covid 19 Viral Pneumonitis during the ongoing 2020 Covid 19 Pandemic -  -He is unvaccinated Encouraged the patient to sit up in chair in the daytime use I-S and flutter valve for pulmonary toiletry and then prone in bed when at night.  Will advance activity and titrate down oxygen as possible. Presented with dyspnea and pleuritic chest pain, worse with taking a breath Febrile with T-max 102.7, respiratory rate 34, positive COVID-19 test on 11/23/2020 Elevated inflammatory markers, trend Unvaccinated against Covid-19 viral infection Independently viewed chest x-ray 11/27/20 which showed multilobular bilateral pulmonary infiltrates consistent with COVID-19 viral pneumonia. Started COVID-19 directed therapies, continue Added p.o. baricitinib daily x14 days Continue IV remdesivir started in the ED x5 days Bronchodilators every 6 hours Vitamin C, D3 and zinc Incentive spirometer and flutter valve Pronate as tolerated or side sleeping Actemra/Baricitinib  off label use - patient was told that if COVID-19 pneumonitis gets worse we might potentially use Actemra off label, patient denies any known history of active diverticulitis, tuberculosis or hepatitis, understands the risks and benefits and wants to proceed with Actemra treatment if required.     SpO2: 90 % O2 Flow Rate (L/min): 2 L/min  Recent Labs  Lab 11/27/20 1732 11/28/20 0240  WBC 5.0  --   PLT 145*  --   CRP  --  6.6*  DDIMER  --  0.95*  PROCALCITON  --  0.43  AST  --  78*  ALT  --  80*  ALKPHOS  --  67  BILITOT  --  0.9  ALBUMIN  --  3.2*  LATICACIDVEN  --  1.9       ABG  No results found for: PHART, PCO2ART, PO2ART, HCO3, TCO2, ACIDBASEDEF, O2SAT   2.  Electrolytes -   3.  ARDS - As in #1  4.   Pleuritic chest pain in the setting of COVID-19 viral pneumonia Troponin is negative  x2 D-dimer 0.95 Follow CTA chest ordered by EDP 12/4 to rule out pulmonary embolism Gentle IV fluid hydration, normal saline at 50 cc/h x 1 day, to avoid contrast-induced nephropathy Use incentive spirometer and flutter valve to avoid pulmonary edema, atelectasis, or pleural effusions  Acute hypoxic respiratory failure secondary to COVID-19 viral pneumonia Not on oxygen supplementation at baseline Currently on 2 L to maintain O2 saturation greater than 90% Presented with hypoxia, O2 saturation 86% on room air.  Essential hypertension BP is currently at goal Resume home regimen at lower doses to avoid hypotension Monitor vital signs  Obesity BMI 31 Recommend weight loss outpatient with regular physical activity and healthy dieting.  Chronic anxiety Resume home Celexa       Condition - Extremely Guarded  Code Status :  Full  Consults  :  none  Procedures  :  none  Disposition Plan  :    Status is: Inpatient  Remains inpatient appropriate because:Hemodynamically unstable and IV treatments appropriate due to intensity of illness or inability to take PO   Dispo:  Patient From: Home  Planned Disposition: Home  Expected discharge date: 12/02/2020  Medically stable for discharge: No       DVT Prophylaxis  :  Lovenox -  Lab Results  Component Value Date   PLT 145 (L) 11/27/2020    Diet :  Diet Order  Diet Heart Room service appropriate? Yes; Fluid consistency: Thin  Diet effective now                  Inpatient Medications  Scheduled Meds: . vitamin C  500 mg Oral Daily  . baricitinib  4 mg Oral Daily  . carvedilol  6.25 mg Oral BID WC  . cholecalciferol  1,000 Units Oral Daily  . citalopram  20 mg Oral Daily  . enoxaparin (LOVENOX) injection  60 mg Subcutaneous Daily  . fentaNYL (SUBLIMAZE) injection  50 mcg Intravenous Once  . insulin aspart  0-9 Units Subcutaneous TID WC  . Ipratropium-Albuterol  1 puff Inhalation Q6H  .  lisinopril  5 mg Oral Daily  . methylPREDNISolone (SOLU-MEDROL) injection  80 mg Intravenous Q8H  . multivitamin with minerals  1 tablet Oral Daily  . pantoprazole  40 mg Oral Daily  . zinc sulfate  220 mg Oral Daily   Continuous Infusions: . sodium chloride 50 mL/hr at 11/28/20 0844  . azithromycin Stopped (11/28/20 1146)  . cefTRIAXone (ROCEPHIN)  IV Stopped (11/28/20 1225)  . [START ON 11/29/2020] remdesivir 100 mg in NS 100 mL     PRN Meds:.guaiFENesin-dextromethorphan  Antibiotics  :    Anti-infectives (From admission, onward)   Start     Dose/Rate Route Frequency Ordered Stop   11/29/20 1000  remdesivir 100 mg in sodium chloride 0.9 % 100 mL IVPB       "Followed by" Linked Group Details   100 mg 200 mL/hr over 30 Minutes Intravenous Daily 11/28/20 0155 12/03/20 0959   11/28/20 1100  cefTRIAXone (ROCEPHIN) 2 g in sodium chloride 0.9 % 100 mL IVPB        2 g 200 mL/hr over 30 Minutes Intravenous Every 24 hours 11/28/20 1059     11/28/20 0830  azithromycin (ZITHROMAX) 500 mg in sodium chloride 0.9 % 250 mL IVPB        500 mg 250 mL/hr over 60 Minutes Intravenous Daily 11/28/20 0808     11/28/20 0830  cefTRIAXone (ROCEPHIN) 2 g in sodium chloride 0.9 % 100 mL IVPB  Status:  Discontinued        2 g 200 mL/hr over 30 Minutes Intravenous Daily 11/28/20 0808 11/28/20 0811   11/28/20 0300  remdesivir 200 mg in sodium chloride 0.9% 250 mL IVPB       "Followed by" Linked Group Details   200 mg 580 mL/hr over 30 Minutes Intravenous Once 11/28/20 0155 11/28/20 0415        Mliss Fritz Damien Batty M.D on 11/28/2020 at 2:30 PM  To page go to [email or web address]  [facility] -  Office  [phone]    Objective:   Vitals:   11/28/20 1245 11/28/20 1300 11/28/20 1315 11/28/20 1330  BP: 123/77 130/87 128/77 129/74  Pulse: 72 72 75 73  Resp: (!) 23 (!) 21 (!) 21 (!) 22  Temp:      TempSrc:      SpO2: 93% 90% (!) 88% 90%  Weight:      Height:        Wt Readings from Last 3  Encounters:  11/27/20 122.5 kg  11/14/16 122.5 kg     Intake/Output Summary (Last 24 hours) at 11/28/2020 1430 Last data filed at 11/28/2020 0415 Gross per 24 hour  Intake 250 ml  Output --  Net 250 ml     Physical Exam  Awake Alert, No new F.N deficits, Normal affect Symmetrical Chest wall movement, Good air movement  bilaterally, CTAB RRR,No Gallops,Rubs or new Murmurs, No Parasternal Heave +ve B.Sounds, Abd Soft, No tenderness,No rebound - guarding or rigidity. No Cyanosis, Clubbing or edema, No new Rash or bruise      Data Review:    CBC Recent Labs  Lab 11/27/20 1732  WBC 5.0  HGB 15.2  HCT 46.9  PLT 145*  MCV 93.6  MCH 30.3  MCHC 32.4  RDW 13.2    Recent Labs  Lab 11/27/20 1732 11/28/20 0240  NA 136  --   K 3.8  --   CL 101  --   CO2 22  --   GLUCOSE 166*  --   BUN 18  --   CREATININE 0.93  --   CALCIUM 9.2  --   AST  --  78*  ALT  --  80*  ALKPHOS  --  67  BILITOT  --  0.9  ALBUMIN  --  3.2*  CRP  --  6.6*  DDIMER  --  0.95*  PROCALCITON  --  0.43  LATICACIDVEN  --  1.9    ------------------------------------------------------------------------------------------------------------------ Recent Labs    11/28/20 0240  TRIG 95    No results found for: HGBA1C ------------------------------------------------------------------------------------------------------------------ No results for input(s): TSH, T4TOTAL, T3FREE, THYROIDAB in the last 72 hours.  Invalid input(s): FREET3  Cardiac Enzymes No results for input(s): CKMB, TROPONINI, MYOGLOBIN in the last 168 hours.  Invalid input(s): CK ------------------------------------------------------------------------------------------------------------------ No results found for: BNP  Micro Results No results found for this or any previous visit (from the past 240 hour(s)).  Radiology Reports CT Angio Chest PE W/Cm &/Or Wo Cm  Result Date: 11/28/2020 CLINICAL DATA:  Chest pain. EXAM: CT  ANGIOGRAPHY CHEST WITH CONTRAST TECHNIQUE: Multidetector CT imaging of the chest was performed using the standard protocol during bolus administration of intravenous contrast. Multiplanar CT image reconstructions and MIPs were obtained to evaluate the vascular anatomy. CONTRAST:  64mL OMNIPAQUE IOHEXOL 350 MG/ML SOLN COMPARISON:  None. FINDINGS: Cardiovascular: Some of the most peripheral segmental and subsegmental pulmonary artery branches cannot be definitively characterize due to mild patient breathing motion artifact, however, there is no pulmonary embolism identified within the main, lobar or central segmental pulmonary arteries bilaterally. No pericardial effusion. No thoracic aortic aneurysm or evidence of aortic dissection. Mediastinum/Nodes: No mass or enlarged lymph nodes are seen within the mediastinum. Esophagus is unremarkable. Trachea is unremarkable. Lungs/Pleura: Patchy ground-glass airspace opacities are seen throughout both lungs, RIGHT slightly greater than LEFT, consistent with multifocal pneumonia. No pleural effusion or pneumothorax. Upper Abdomen: Liver is low in density suggesting fatty infiltration, incompletely imaged. Limited images of the upper abdomen are otherwise unremarkable. Musculoskeletal: No acute or suspicious osseous finding. Mild degenerative spurring within the thoracic spine. Review of the MIP images confirms the above findings. IMPRESSION: 1. Bilateral multifocal pneumonia. 2. No pulmonary embolism seen. 3. Fatty infiltration of the liver. Electronically Signed   By: Bary Richard M.D.   On: 11/28/2020 07:56   DG Chest Portable 1 View  Result Date: 11/27/2020 CLINICAL DATA:  COVID-19 positive, chest pain EXAM: PORTABLE CHEST 1 VIEW COMPARISON:  Radiograph 11/14/2016 FINDINGS: Heterogeneous mixed consolidative and interstitial opacities present the mid to lower lungs in a slight peripheral predominance compatible reported imaging features of COVID 19 pneumonia. No  pneumothorax. No effusion. The cardiomediastinal contours are unremarkable for portable technique. No acute osseous or soft tissue abnormality. IMPRESSION: Appearance compatible with a multifocal pneumonia in the setting of COVID 19. Electronically Signed   By: Kreg Shropshire  M.D.   On: 11/27/2020 18:26

## 2020-11-28 NOTE — ED Notes (Signed)
Pt resting comfortably in bed, O2 sats drop to 87 for prolonged period of time O2 increased to 4L [place] with improvement in sats.   Pt continues to deny pain at this time Denies any further needs Call light within reach   Pt does not appear in distress, respirations are even and non-labored  Skin is warm, dry and intact.

## 2020-11-28 NOTE — ED Notes (Signed)
Checked patient cbg it was [id number] notified RN of blood sugar patient id resting with call bell in reach

## 2020-11-29 DIAGNOSIS — I10 Essential (primary) hypertension: Secondary | ICD-10-CM

## 2020-11-29 LAB — COMPREHENSIVE METABOLIC PANEL
ALT: 70 U/L — ABNORMAL HIGH (ref 0–44)
AST: 58 U/L — ABNORMAL HIGH (ref 15–41)
Albumin: 3 g/dL — ABNORMAL LOW (ref 3.5–5.0)
Alkaline Phosphatase: 60 U/L (ref 38–126)
Anion gap: 11 (ref 5–15)
BUN: 20 mg/dL (ref 6–20)
CO2: 23 mmol/L (ref 22–32)
Calcium: 8.8 mg/dL — ABNORMAL LOW (ref 8.9–10.3)
Chloride: 104 mmol/L (ref 98–111)
Creatinine, Ser: 1 mg/dL (ref 0.61–1.24)
GFR, Estimated: >60 mL/min (ref >60)
Glucose, Bld: 268 mg/dL — ABNORMAL HIGH (ref 70–99)
Potassium: 3.9 mmol/L (ref 3.5–5.1)
Sodium: 138 mmol/L (ref 135–145)
Total Bilirubin: 0.6 mg/dL (ref 0.3–1.2)
Total Protein: 6.3 g/dL — ABNORMAL LOW (ref 6.5–8.1)

## 2020-11-29 LAB — CBC WITH DIFFERENTIAL/PLATELET
Abs Immature Granulocytes: 0.05 10*3/uL (ref 0.00–0.07)
Basophils Absolute: 0 10*3/uL (ref 0.0–0.1)
Basophils Relative: 0 %
Eosinophils Absolute: 0 10*3/uL (ref 0.0–0.5)
Eosinophils Relative: 0 %
HCT: 42.2 % (ref 39.0–52.0)
Hemoglobin: 13.9 g/dL (ref 13.0–17.0)
Immature Granulocytes: 1 %
Lymphocytes Relative: 9 %
Lymphs Abs: 0.7 10*3/uL (ref 0.7–4.0)
MCH: 30.4 pg (ref 26.0–34.0)
MCHC: 32.9 g/dL (ref 30.0–36.0)
MCV: 92.3 fL (ref 80.0–100.0)
Monocytes Absolute: 0.5 10*3/uL (ref 0.1–1.0)
Monocytes Relative: 7 %
Neutro Abs: 6.3 10*3/uL (ref 1.7–7.7)
Neutrophils Relative %: 83 %
Platelets: 173 10*3/uL (ref 150–400)
RBC: 4.57 MIL/uL (ref 4.22–5.81)
RDW: 13.2 % (ref 11.5–15.5)
WBC: 7.6 10*3/uL (ref 4.0–10.5)
nRBC: 0 % (ref 0.0–0.2)

## 2020-11-29 LAB — FERRITIN: Ferritin: 1838 ng/mL — ABNORMAL HIGH (ref 24–336)

## 2020-11-29 LAB — CBG MONITORING, ED
Glucose-Capillary: 288 mg/dL — ABNORMAL HIGH (ref 70–99)
Glucose-Capillary: 285 mg/dL — ABNORMAL HIGH (ref 70–99)
Glucose-Capillary: 317 mg/dL — ABNORMAL HIGH (ref 70–99)

## 2020-11-29 LAB — PHOSPHORUS: Phosphorus: 2.6 mg/dL (ref 2.5–4.6)

## 2020-11-29 LAB — PROCALCITONIN: Procalcitonin: 0.34 ng/mL

## 2020-11-29 LAB — MAGNESIUM: Magnesium: 2.1 mg/dL (ref 1.7–2.4)

## 2020-11-29 LAB — D-DIMER, QUANTITATIVE: D-Dimer, Quant: 0.74 ug/mL-FEU — ABNORMAL HIGH (ref 0.00–0.50)

## 2020-11-29 LAB — C-REACTIVE PROTEIN: CRP: 7.7 mg/dL — ABNORMAL HIGH (ref <1.0)

## 2020-11-29 MED ORDER — INSULIN DETEMIR 100 UNIT/ML ~~LOC~~ SOLN
12.0000 [IU] | Freq: Every day | SUBCUTANEOUS | Status: DC
  Administered 2020-11-29: 12 [IU] via SUBCUTANEOUS
  Filled 2020-11-29 (×2): qty 0.12

## 2020-11-29 NOTE — Progress Notes (Signed)
PROGRESS NOTE                                                                             PROGRESS NOTE                                                                                                                                                                                                             Patient Demographics:    Alejandro Bowen, is a 59 y.o. male, DOB - 1961/12/01, ZES:[id number]  Outpatient Primary MD for the patient is [name], MD    LOS - 1  Admit date - 11/27/2020    Chief Complaint  Patient presents with  . Covid Positive  . Chest Pain       Brief Narrative     HPI: Alejandro Bowen is a 58 y.o. male with medical history significant for obesity, chronic anxiety, essential hypertension, COVID-19 positive test on 11/23/2020 who presented to [facility] ED with complaints of shortness of breath and chest pain worse with taking a breath of 3 day duration, gradually worsening.  Associated with mild non productive cough, intermittent subjective fevers, decreased appetite, and fatigue.  He denies, abdominal pain, nausea, vomiting, diarrhea, lower extremity edema or lower extremity pain.  No loss of taste or loss of smell.  Unvaccinated for Covid-19 virus.  Before thanksgivings, he was exposed to someone at work who tested positive.  Hypoxic in the ED with O2 saturation in the mid 80s on room air at rest.  Chest x-ray consistent with COVID-19 viral pneumonia.  Started on COVID-19 directed therapies in the ED, the patient gave consent to use Remdesivir and Baricitinib.  TRH asked to admit.  ED Course: T-max 102.7, respiratory rate 34.  Elevated inflammatory markers, CRP 6.6, D-dimer 0.9. Mildly elevated LFTs.    Subjective:    Alejandro Bowen today denies any chest pain, fever or chills, report dyspnea and cough.      Assessment  & Plan :    Active Problems:   Pneumonia due to COVID-19 virus  Essential hypertension  Acute Hypoxic  Resp. Failure due to Acute Covid 19 Viral Pneumonitis during the ongoing 2020 Covid 19 Pandemic -  -He is unvaccinated -Patient with significant oxygen requirement, this morning he is up 5 to 6 L on high flow nasal cannula. -Was encouraged to use incentive spirometry and flutter valve, off the bed to chair. -Continue with IV Solu-Medrol. -Continue with IV remdesivir. -Discussed with patient about baricitinib, patient denies any known history of active diverticulitis, tuberculosis or hepatitis, understands the risks and benefits and wants to proceed with baricitinib, he will be started on baricitinib today. -Continue to trend inflammatory markers. -Procalcitonin 0.43 on admission, continue with IV Rocephin and azithromycin,    SpO2: 99 % O2 Flow Rate (L/min): 5 L/min  Recent Labs  Lab 11/27/20 1732 11/28/20 0240 11/29/20 0349  WBC 5.0  --  7.6  PLT 145*  --  173  CRP  --  6.6* 7.7*  DDIMER  --  0.95* 0.74*  PROCALCITON  --  0.43 0.34  AST  --  78* 58*  ALT  --  80* 70*  ALKPHOS  --  67 60  BILITOT  --  0.9 0.6  ALBUMIN  --  3.2* 3.0*  LATICACIDVEN  --  1.9  --        ABG  No results found for: PHART, PCO2ART, PO2ART, HCO3, TCO2, ACIDBASEDEF, O2SAT  Transaminitis -Due to Covid, trending down, continue to monitor  Hyperglycemia -Remains uncontrolled on sliding scale, have started on 12 units of Levemir daily.    Pleuritic chest pain in the setting of COVID-19 viral pneumonia -Related to cough, troponins negative x2, CTA chest negative for PE.  Essential hypertension -Acceptable, continue with Coreg and lisinopril  Obesity BMI 31 Recommend weight loss outpatient with regular physical activity and healthy dieting.  Chronic anxiety Resume home Celexa       Condition -Guarded  Family communication:   Code Status :  Full  Consults  :  none  Procedures  :  none  Disposition Plan  :    Status is: Inpatient  Remains inpatient appropriate  because:Hemodynamically unstable and IV treatments appropriate due to intensity of illness or inability to take PO   Dispo:  Patient From: Home  Planned Disposition: Home  Expected discharge date: 12/02/2020  Medically stable for discharge: No       DVT Prophylaxis  :  Lovenox -  Lab Results  Component Value Date   PLT 173 11/29/2020    Diet :  Diet Order            Diet Heart Room service appropriate? Yes; Fluid consistency: Thin  Diet effective now                  Inpatient Medications  Scheduled Meds: . vitamin C  500 mg Oral Daily  . baricitinib  4 mg Oral Daily  . carvedilol  6.25 mg Oral BID WC  . cholecalciferol  1,000 Units Oral Daily  . citalopram  20 mg Oral Daily  . enoxaparin (LOVENOX) injection  60 mg Subcutaneous Daily  . fentaNYL (SUBLIMAZE) injection  50 mcg Intravenous Once  . insulin aspart  0-9 Units Subcutaneous TID WC  . insulin detemir  12 Units Subcutaneous Daily  . Ipratropium-Albuterol  1 puff Inhalation Q6H  . lisinopril  5 mg Oral Daily  . methylPREDNISolone (SOLU-MEDROL) injection  80 mg Intravenous Q8H  . multivitamin with minerals  1 tablet Oral Daily  . pantoprazole  40 mg Oral Daily  . zinc sulfate  220 mg Oral Daily   Continuous Infusions: . azithromycin Stopped (11/29/20 0922)  . cefTRIAXone (ROCEPHIN)  IV Stopped (11/29/20 1143)  . remdesivir 100 mg in NS 100 mL Stopped (11/29/20 1001)   PRN Meds:.guaiFENesin-dextromethorphan  Antibiotics  :    Anti-infectives (From admission, onward)   Start     Dose/Rate Route Frequency Ordered Stop   11/29/20 1000  remdesivir 100 mg in sodium chloride 0.9 % 100 mL IVPB       "Followed by" Linked Group Details   100 mg 200 mL/hr over 30 Minutes Intravenous Daily 11/28/20 0155 12/03/20 0959   11/28/20 1100  cefTRIAXone (ROCEPHIN) 2 g in sodium chloride 0.9 % 100 mL IVPB        2 g 200 mL/hr over 30 Minutes Intravenous Every 24 hours 11/28/20 1059     11/28/20 0830  azithromycin  (ZITHROMAX) 500 mg in sodium chloride 0.9 % 250 mL IVPB        500 mg 250 mL/hr over 60 Minutes Intravenous Daily 11/28/20 0808     11/28/20 0830  cefTRIAXone (ROCEPHIN) 2 g in sodium chloride 0.9 % 100 mL IVPB  Status:  Discontinued        2 g 200 mL/hr over 30 Minutes Intravenous Daily 11/28/20 0808 11/28/20 0811   11/28/20 0300  remdesivir 200 mg in sodium chloride 0.9% 250 mL IVPB       "Followed by" Linked Group Details   200 mg 580 mL/hr over 30 Minutes Intravenous Once 11/28/20 0155 11/28/20 0415        Mliss Fritz Aaro Meyers M.D on 11/29/2020 at 1:33 PM  To page go to [email or web address]  [facility] -  Office  [phone]    Objective:   Vitals:   11/29/20 0600 11/29/20 0824 11/29/20 0830 11/29/20 1100  BP: 118/78 (!) 141/77 134/77 135/78  Pulse: 66  72 60  Resp: (!) 24  (!) 23 12  Temp:      TempSrc:      SpO2: 91%  (!) 85% 99%  Weight:      Height:        Wt Readings from Last 3 Encounters:  11/27/20 122.5 kg  11/14/16 122.5 kg     Intake/Output Summary (Last 24 hours) at 11/29/2020 1333 Last data filed at 11/29/2020 8295 Gross per 24 hour  Intake --  Output 900 ml  Net -900 ml     Physical Exam  Awake Alert, Oriented X 3, No new F.N deficits, Normal affect Symmetrical Chest wall movement, Good air movement bilaterally, CTAB RRR,No Gallops,Rubs or new Murmurs, No Parasternal Heave +ve B.Sounds, Abd Soft, No tenderness, No rebound - guarding or rigidity. No Cyanosis, Clubbing or edema, No new Rash or bruise       Data Review:    CBC Recent Labs  Lab 11/27/20 1732 11/29/20 0349  WBC 5.0 7.6  HGB 15.2 13.9  HCT 46.9 42.2  PLT 145* 173  MCV 93.6 92.3  MCH 30.3 30.4  MCHC 32.4 32.9  RDW 13.2 13.2  LYMPHSABS  --  0.7  MONOABS  --  0.5  EOSABS  --  0.0  BASOSABS  --  0.0    Recent Labs  Lab 11/27/20 1732 11/28/20 0240 11/29/20 0349  NA 136  --  138  K 3.8  --  3.9  CL 101  --  104  CO2 22  --  23  GLUCOSE 166*  --  268*  BUN 18   --  20  CREATININE 0.93  --  1.00  CALCIUM 9.2  --  8.8*  AST  --  78* 58*  ALT  --  80* 70*  ALKPHOS  --  67 60  BILITOT  --  0.9 0.6  ALBUMIN  --  3.2* 3.0*  MG  --   --  2.1  CRP  --  6.6* 7.7*  DDIMER  --  0.95* 0.74*  PROCALCITON  --  0.43 0.34  LATICACIDVEN  --  1.9  --     ------------------------------------------------------------------------------------------------------------------ Recent Labs    11/28/20 0240  TRIG 95    No results found for: HGBA1C ------------------------------------------------------------------------------------------------------------------ No results for input(s): TSH, T4TOTAL, T3FREE, THYROIDAB in the last 72 hours.  Invalid input(s): FREET3  Cardiac Enzymes No results for input(s): CKMB, TROPONINI, MYOGLOBIN in the last 168 hours.  Invalid input(s): CK ------------------------------------------------------------------------------------------------------------------ No results found for: BNP  Micro Results Recent Results (from the past 240 hour(s))  Blood Culture (routine x 2)     Status: None (Preliminary result)   Collection Time: 11/28/20  2:40 AM   Specimen: BLOOD  Result Value Ref Range Status   Specimen Description BLOOD LEFT ANTECUBITAL  Final   Special Requests   Final    BOTTLES DRAWN AEROBIC AND ANAEROBIC Blood Culture adequate volume   Culture   Final    NO GROWTH 1 DAY Performed at [facility], [address] [place], [place] [address]    Report Status PENDING  Incomplete  Blood Culture (routine x 2)     Status: None (Preliminary result)   Collection Time: 11/28/20  2:40 AM   Specimen: BLOOD LEFT HAND  Result Value Ref Range Status   Specimen Description BLOOD LEFT HAND  Final   Special Requests   Final    BOTTLES DRAWN AEROBIC AND ANAEROBIC Blood Culture results may not be optimal due to an inadequate volume of blood received in culture bottles   Culture   Final    NO GROWTH 1 DAY Performed at [facility], [address], [place], [place] [address]    Report Status PENDING  Incomplete    Radiology Reports CT Angio Chest PE W/Cm &/Or Wo Cm  Result Date: 11/28/2020 CLINICAL DATA:  Chest pain. EXAM: CT ANGIOGRAPHY CHEST WITH CONTRAST TECHNIQUE: Multidetector CT imaging of the chest was performed using the standard protocol during bolus administration of intravenous contrast. Multiplanar CT image reconstructions and MIPs were obtained to evaluate the vascular anatomy. CONTRAST:  48mL OMNIPAQUE IOHEXOL 350 MG/ML SOLN COMPARISON:  None. FINDINGS: Cardiovascular: Some of the most peripheral segmental and subsegmental pulmonary artery branches cannot be definitively characterize due to mild patient breathing motion artifact, however, there is no pulmonary embolism identified within the main, lobar or central segmental pulmonary arteries bilaterally. No pericardial effusion. No thoracic aortic aneurysm or evidence of aortic dissection. Mediastinum/Nodes: No mass or enlarged lymph nodes are seen within the mediastinum. Esophagus is unremarkable. Trachea is unremarkable. Lungs/Pleura: Patchy ground-glass airspace opacities are seen throughout both lungs, RIGHT slightly greater than LEFT, consistent with multifocal pneumonia. No pleural effusion or pneumothorax. Upper Abdomen: Liver is low in density suggesting fatty infiltration, incompletely imaged. Limited images of the upper abdomen are otherwise unremarkable. Musculoskeletal: No acute or suspicious osseous finding. Mild degenerative spurring within the thoracic spine. Review of the MIP images confirms the above findings. IMPRESSION: 1. Bilateral multifocal pneumonia. 2. No pulmonary embolism seen. 3. Fatty infiltration of the  liver. Electronically Signed   By: Bary Richard M.D.   On: 11/28/2020 07:56   DG Chest Portable 1 View  Result Date: 11/27/2020 CLINICAL DATA:  COVID-19 positive, chest pain EXAM: PORTABLE CHEST 1 VIEW COMPARISON:  Radiograph  11/14/2016 FINDINGS: Heterogeneous mixed consolidative and interstitial opacities present the mid to lower lungs in a slight peripheral predominance compatible reported imaging features of COVID 19 pneumonia. No pneumothorax. No effusion. The cardiomediastinal contours are unremarkable for portable technique. No acute osseous or soft tissue abnormality. IMPRESSION: Appearance compatible with a multifocal pneumonia in the setting of COVID 19. Electronically Signed   By: Kreg Shropshire M.D.   On: 11/27/2020 18:26

## 2020-11-29 NOTE — ED Notes (Signed)
Pt's O2 staying around 83%. Turned Pt's Nasal O2 up to 6L

## 2020-11-29 NOTE — ED Notes (Signed)
Charge Nurse, Jessica, RN, says she will watch patient while this nurse transports another patient to the floor. 

## 2020-11-29 NOTE — ED Notes (Signed)
Pt assisted to bedside recliner. Pt resting quietly in recliner at this time

## 2020-11-29 NOTE — ED Notes (Signed)
Tele  Breakfast Ordered 

## 2020-11-29 NOTE — ED Notes (Signed)
Pt given incentive spirometer and educated on use, pt voiced understanding and demonstrated correct use and technique.

## 2020-11-30 LAB — COMPREHENSIVE METABOLIC PANEL
ALT: 68 U/L — ABNORMAL HIGH (ref 0–44)
AST: 53 U/L — ABNORMAL HIGH (ref 15–41)
Albumin: 2.8 g/dL — ABNORMAL LOW (ref 3.5–5.0)
Alkaline Phosphatase: 56 U/L (ref 38–126)
Anion gap: 11 (ref 5–15)
BUN: 30 mg/dL — ABNORMAL HIGH (ref 6–20)
CO2: 24 mmol/L (ref 22–32)
Calcium: 9.1 mg/dL (ref 8.9–10.3)
Chloride: 105 mmol/L (ref 98–111)
Creatinine, Ser: 1.07 mg/dL (ref 0.61–1.24)
GFR, Estimated: >60 mL/min (ref >60)
Glucose, Bld: 274 mg/dL — ABNORMAL HIGH (ref 70–99)
Potassium: 4.3 mmol/L (ref 3.5–5.1)
Sodium: 140 mmol/L (ref 135–145)
Total Bilirubin: 0.3 mg/dL (ref 0.3–1.2)
Total Protein: 6.1 g/dL — ABNORMAL LOW (ref 6.5–8.1)

## 2020-11-30 LAB — CBC WITH DIFFERENTIAL/PLATELET
Abs Immature Granulocytes: 0.1 10*3/uL — ABNORMAL HIGH (ref 0.00–0.07)
Basophils Absolute: 0 10*3/uL (ref 0.0–0.1)
Basophils Relative: 0 %
Eosinophils Absolute: 0 10*3/uL (ref 0.0–0.5)
Eosinophils Relative: 0 %
HCT: 40.3 % (ref 39.0–52.0)
Hemoglobin: 13.8 g/dL (ref 13.0–17.0)
Immature Granulocytes: 1 %
Lymphocytes Relative: 6 %
Lymphs Abs: 0.6 10*3/uL — ABNORMAL LOW (ref 0.7–4.0)
MCH: 31.2 pg (ref 26.0–34.0)
MCHC: 34.2 g/dL (ref 30.0–36.0)
MCV: 91 fL (ref 80.0–100.0)
Monocytes Absolute: 0.8 10*3/uL (ref 0.1–1.0)
Monocytes Relative: 7 %
Neutro Abs: 10.2 10*3/uL — ABNORMAL HIGH (ref 1.7–7.7)
Neutrophils Relative %: 86 %
Platelets: 232 10*3/uL (ref 150–400)
RBC: 4.43 MIL/uL (ref 4.22–5.81)
RDW: 13.1 % (ref 11.5–15.5)
WBC: 11.7 10*3/uL — ABNORMAL HIGH (ref 4.0–10.5)
nRBC: 0 % (ref 0.0–0.2)

## 2020-11-30 LAB — MAGNESIUM: Magnesium: 2.5 mg/dL — ABNORMAL HIGH (ref 1.7–2.4)

## 2020-11-30 LAB — CBG MONITORING, ED
Glucose-Capillary: 267 mg/dL — ABNORMAL HIGH (ref 70–99)
Glucose-Capillary: 358 mg/dL — ABNORMAL HIGH (ref 70–99)
Glucose-Capillary: 284 mg/dL — ABNORMAL HIGH (ref 70–99)

## 2020-11-30 LAB — HEMOGLOBIN A1C
Hgb A1c MFr Bld: 8.3 % — ABNORMAL HIGH (ref 4.8–5.6)
Mean Plasma Glucose: 191.51 mg/dL

## 2020-11-30 LAB — HIV ANTIBODY (ROUTINE TESTING W REFLEX): HIV Screen 4th Generation wRfx: NONREACTIVE

## 2020-11-30 LAB — PHOSPHORUS: Phosphorus: 3.1 mg/dL (ref 2.5–4.6)

## 2020-11-30 LAB — D-DIMER, QUANTITATIVE: D-Dimer, Quant: 0.53 ug/mL-FEU — ABNORMAL HIGH (ref 0.00–0.50)

## 2020-11-30 LAB — GLUCOSE, CAPILLARY: Glucose-Capillary: 230 mg/dL — ABNORMAL HIGH (ref 70–99)

## 2020-11-30 MED ORDER — ASPIRIN EC 81 MG PO TBEC
81.0000 mg | DELAYED_RELEASE_TABLET | Freq: Every day | ORAL | Status: DC
  Administered 2020-11-30: 81 mg via ORAL
  Filled 2020-11-30: qty 1

## 2020-11-30 MED ORDER — INSULIN DETEMIR 100 UNIT/ML ~~LOC~~ SOLN
25.0000 [IU] | Freq: Every day | SUBCUTANEOUS | Status: DC
  Administered 2020-11-30: 25 [IU] via SUBCUTANEOUS
  Filled 2020-11-30 (×2): qty 0.25

## 2020-11-30 MED ORDER — INSULIN ASPART 100 UNIT/ML ~~LOC~~ SOLN
4.0000 [IU] | Freq: Three times a day (TID) | SUBCUTANEOUS | Status: DC
  Administered 2020-11-30 – 2020-12-01 (×6): 4 [IU] via SUBCUTANEOUS

## 2020-11-30 NOTE — ED Notes (Signed)
Attempted report x1. 

## 2020-11-30 NOTE — Evaluation (Signed)
Physical Therapy Evaluation Patient Details Name: Alejandro Bowen MRN: [id number] DOB: 1961/11/10 Today's Date: 11/30/2020   History of Present Illness  59yo male c/o SOB, chest pain, and cough all progressive in nature, hypoxic in the ED. PE negative, found to be Covid positive. PMH anxiety  Clinical Impression   Patient received in recliner, very pleasant and willing to participate in therapy. Session limited by line length/having to stay in room in ED, but able to perform transfers and side steps in room well with min guard/no device. Mildly unsteady and weak but suspect this will likely improve as he continues to feel better medically. SpO2 drop to as low as 84% on 5LPM/HR up to 121BPM with activity, patient asymptomatic but did have RR as high as 30. Left up in recliner with all needs met, RN aware of patient status. At this point do not anticipate need for skilled PT f/u at DC.     Follow Up Recommendations No PT follow up    Equipment Recommendations  None recommended by PT    Recommendations for Other Services       Precautions / Restrictions Precautions Precautions: Other (comment) Precaution Comments: watch sats/HR, Covid + Restrictions Weight Bearing Restrictions: No      Mobility  Bed Mobility               General bed mobility comments: OOB in recliner upon entry    Transfers Overall transfer level: Needs assistance   Transfers: Sit to/from Stand Sit to Stand: Min guard         General transfer comment: no physical assist given, did help to manage lines  Ambulation/Gait             General Gait Details: limited by line length/limited to in room in the ED- able to take  side steps back and forth with SpO2 drop to 84%/HR elevation to 121BPM with min guard for safety  Stairs            Wheelchair Mobility    Modified Rankin (Stroke Patients Only)       Balance Overall balance assessment: Mild deficits observed, not formally tested                                            Pertinent Vitals/Pain Pain Assessment: No/denies pain    Home Living Family/patient expects to be discharged to:: Private residence Living Arrangements: Spouse/significant other Available Help at Discharge: Family;Available 24 hours/day Type of Home: House Home Access: Stairs to enter Entrance Stairs-Rails: Right Entrance Stairs-Number of Steps: 2 Home Layout: Laundry or work area in basement;Able to live on main level with bedroom/bathroom Home Equipment: None Additional Comments: works in [employer] data [employer], very active at baseline    Prior Function Level of Independence: Independent               Journalist, newspaper        Extremity/Trunk Assessment   Upper Extremity Assessment Upper Extremity Assessment: Overall WFL for tasks assessed    Lower Extremity Assessment Lower Extremity Assessment: Overall WFL for tasks assessed    Cervical / Trunk Assessment Cervical / Trunk Assessment: Normal  Communication   Communication: No difficulties  Cognition Arousal/Alertness: Awake/alert Behavior During Therapy: WFL for tasks assessed/performed Overall Cognitive Status: Within Functional Limits for tasks assessed  General Comments: extremely pleasant and cooperative      General Comments      Exercises     Assessment/Plan    PT Assessment Patient needs continued PT services  PT Problem List Decreased knowledge of use of DME;Obesity;Decreased activity tolerance;Cardiopulmonary status limiting activity;Decreased mobility       PT Treatment Interventions DME instruction;Balance training;Gait training;Stair training;Functional mobility training;Patient/family education;Therapeutic activities;Therapeutic exercise    PT Goals (Current goals can be found in the Care Plan section)  Acute Rehab PT Goals Patient Stated Goal: feel better, get up to formal covid unit PT  Goal Formulation: With patient Time For Goal Achievement: 12/14/20 Potential to Achieve Goals: Good    Frequency Min 3X/week   Barriers to discharge        Co-evaluation               AM-PAC PT "6 Clicks" Mobility  Outcome Measure Help needed turning from your back to your side while in a flat bed without using bedrails?: None Help needed moving from lying on your back to sitting on the side of a flat bed without using bedrails?: A Little Help needed moving to and from a bed to a chair (including a wheelchair)?: A Little Help needed standing up from a chair using your arms (e.g., wheelchair or bedside chair)?: A Little Help needed to walk in hospital room?: A Little Help needed climbing 3-5 steps with a railing? : A Little 6 Click Score: 19    End of Session Equipment Utilized During Treatment: Oxygen Activity Tolerance: Patient tolerated treatment well Patient left: in chair;with call bell/phone within reach Nurse Communication: Mobility status PT Visit Diagnosis: Difficulty in walking, not elsewhere classified (R26.2)    Time: 3704-8889 PT Time Calculation (min) (ACUTE ONLY): 18 min   Charges:   PT Evaluation $PT Eval Moderate Complexity: 1 Mod          Windell Norfolk, DPT, PN1   Supplemental Physical Therapist [place]    Pager [phone] Acute Rehab Office [phone]

## 2020-11-30 NOTE — ED Notes (Signed)
Dinner Trays Ordered @ 1705. 

## 2020-11-30 NOTE — Progress Notes (Addendum)
Called for report, No answer. Will call back again  2224: called back for report. No answer.

## 2020-11-30 NOTE — ED Notes (Signed)
Having trouble with 5 lead, have gone through two sets. Looking for third now to change out. Monitor showed afib but there is obvious trouble with leads. Will five to confirm deny

## 2020-11-30 NOTE — Progress Notes (Signed)
PROGRESS NOTE                                                                             PROGRESS NOTE                                                                                                                                                                                                             Patient Demographics:    Alejandro Bowen, is a 59 y.o. male, DOB - 1961-12-05, WUJ:[id number]  Outpatient Primary MD for the patient is [name], MD    LOS - 2  Admit date - 11/27/2020    Chief Complaint  Patient presents with  . Covid Positive  . Chest Pain       Brief Narrative     HPI: Alejandro Bowen is a 59 y.o. male with medical history significant for obesity, chronic anxiety, essential hypertension, COVID-19 positive test on 11/23/2020 who presented to [facility] ED with complaints of shortness of breath and chest pain worse with taking a breath of 3 day duration, gradually worsening.  Associated with mild non productive cough, intermittent subjective fevers, decreased appetite, and fatigue.  He denies, abdominal pain, nausea, vomiting, diarrhea, lower extremity edema or lower extremity pain.  No loss of taste or loss of smell.  Unvaccinated for Covid-19 virus.  Before thanksgivings, he was exposed to someone at work who tested positive.  Hypoxic in the ED with O2 saturation in the mid 80s on room air at rest.  Chest x-ray consistent with COVID-19 viral pneumonia.  Started on COVID-19 directed therapies in the ED, the patient gave consent to use Remdesivir and Baricitinib.  [facility] asked to admit.  ED Course: T-max 102.7, respiratory rate 34.  Elevated inflammatory markers, CRP 6.6, D-dimer 0.9. Mildly elevated LFTs.    Subjective:    Alejandro Bowen today [place] any chest pain, reports dyspnea and cough has been stable      Assessment  & Plan :    Active Problems:   Pneumonia due to COVID-19 virus  Essential hypertension  Acute Hypoxic Resp.  Failure due to Acute Covid 19 Viral Pneumonitis during the ongoing 2020 Covid 19 Pandemic -  -He is unvaccinated -Patient with significant oxygen requirement, this morning he is up 5 to 6 L on high flow nasal cannula. -Was encouraged to use incentive spirometry and flutter valve, off the bed to chair. -Continue with IV Solu-Medrol. -Continue with IV remdesivir. -Continue with baricitinib . -Continue to trend inflammatory markers. -Procalcitonin 0.43 on admission, continue with IV Rocephin and azithromycin,    SpO2: 91 % O2 Flow Rate (L/min): 5 L/min  Recent Labs  Lab 11/27/20 1732 11/28/20 0240 11/29/20 0349 11/30/20 0353  WBC 5.0  --  7.6 11.7*  PLT 145*  --  173 232  CRP  --  6.6* 7.7*  --   DDIMER  --  0.95* 0.74* 0.53*  PROCALCITON  --  0.43 0.34  --   AST  --  78* 58* 53*  ALT  --  80* 70* 68*  ALKPHOS  --  67 60 56  BILITOT  --  0.9 0.6 0.3  ALBUMIN  --  3.2* 3.0* 2.8*  LATICACIDVEN  --  1.9  --   --        ABG  No results found for: PHART, PCO2ART, PO2ART, HCO3, TCO2, ACIDBASEDEF, O2SAT  Transaminitis -Due to Covid, trending down, continue to monitor  Hyperglycemia -remains uncontrolled, will increase Levemir to 25 units, and add NovoLog before meals, will change diet carb modified .  Pleuritic chest pain in the setting of COVID-19 viral pneumonia -Related to cough, troponins negative x2, CTA chest negative for PE.  Essential hypertension -Acceptable, continue with Coreg and lisinopril  Obesity BMI 31 Recommend weight loss outpatient with regular physical activity and healthy dieting.  Chronic anxiety Resume home Celexa       Condition -Guarded  Family communication: Wife has been updated 12/4, 12/5.  Code Status :  Full  Consults  :  none  Procedures  :  none  Disposition Plan  :    Status is: Inpatient  Remains inpatient appropriate because:Hemodynamically unstable and IV treatments appropriate due to intensity of illness or  inability to take PO   Dispo:  Patient From: Home  Planned Disposition: Home  Expected discharge date: 12/02/2020  Medically stable for discharge: No       DVT Prophylaxis  :  Lovenox -  Lab Results  Component Value Date   PLT 232 11/30/2020    Diet :  Diet Order            Diet heart healthy/carb modified Room service appropriate? Yes; Fluid consistency: Thin  Diet effective now                  Inpatient Medications  Scheduled Meds: . vitamin C  500 mg Oral Daily  . baricitinib  4 mg Oral Daily  . carvedilol  6.25 mg Oral BID WC  . cholecalciferol  1,000 Units Oral Daily  . citalopram  20 mg Oral Daily  . enoxaparin (LOVENOX) injection  60 mg Subcutaneous Daily  . fentaNYL (SUBLIMAZE) injection  50 mcg Intravenous Once  . insulin aspart  0-9 Units Subcutaneous TID WC  . insulin aspart  4 Units Subcutaneous TID WC  . insulin detemir  25 Units Subcutaneous Daily  . Ipratropium-Albuterol  1 puff Inhalation Q6H  . lisinopril  5 mg Oral Daily  . methylPREDNISolone (SOLU-MEDROL) injection  80 mg Intravenous Q8H  . multivitamin with  minerals  1 tablet Oral Daily  . pantoprazole  40 mg Oral Daily  . zinc sulfate  220 mg Oral Daily   Continuous Infusions: . azithromycin 500 mg (11/30/20 1233)  . cefTRIAXone (ROCEPHIN)  IV Stopped (11/29/20 1143)  . remdesivir 100 mg in NS 100 mL Stopped (11/30/20 1218)   PRN Meds:.guaiFENesin-dextromethorphan  Antibiotics  :    Anti-infectives (From admission, onward)   Start     Dose/Rate Route Frequency Ordered Stop   11/29/20 1000  remdesivir 100 mg in sodium chloride 0.9 % 100 mL IVPB       "Followed by" Linked Group Details   100 mg 200 mL/hr over 30 Minutes Intravenous Daily 11/28/20 0155 12/03/20 0959   11/28/20 1100  cefTRIAXone (ROCEPHIN) 2 g in sodium chloride 0.9 % 100 mL IVPB        2 g 200 mL/hr over 30 Minutes Intravenous Every 24 hours 11/28/20 1059     11/28/20 0830  azithromycin (ZITHROMAX) 500 mg in sodium  chloride 0.9 % 250 mL IVPB        500 mg 250 mL/hr over 60 Minutes Intravenous Daily 11/28/20 0808     11/28/20 0830  cefTRIAXone (ROCEPHIN) 2 g in sodium chloride 0.9 % 100 mL IVPB  Status:  Discontinued        2 g 200 mL/hr over 30 Minutes Intravenous Daily 11/28/20 0808 11/28/20 0811   11/28/20 0300  remdesivir 200 mg in sodium chloride 0.9% 250 mL IVPB       "Followed by" Linked Group Details   200 mg 580 mL/hr over 30 Minutes Intravenous Once 11/28/20 0155 11/28/20 0415        Mliss Fritzawood Lamarius Dirr M.D on 11/30/2020 at 1:41 PM  To page go to [email or web address]  [facility] -  Office  [phone]    Objective:   Vitals:   11/30/20 1130 11/30/20 1152 11/30/20 1200 11/30/20 1300  BP: (!) 144/95 (!) 147/90 (!) 147/75 140/89  Pulse: 86  87 80  Resp: (!) 25  (!) 22 19  Temp:      TempSrc:      SpO2: 92%  91% 91%  Weight:      Height:        Wt Readings from Last 3 Encounters:  11/27/20 122.5 kg  11/14/16 122.5 kg     Intake/Output Summary (Last 24 hours) at 11/30/2020 1341 Last data filed at 11/29/2020 1448 Gross per 24 hour  Intake --  Output 800 ml  Net -800 ml     Physical Exam  Awake Alert, Oriented X 3, No new F.N deficits, Normal affect Symmetrical Chest wall movement, Good air movement bilaterally, CTAB RRR,No Gallops,Rubs or new Murmurs, No Parasternal Heave +ve B.Sounds, Abd Soft, No tenderness, No rebound - guarding or rigidity. No Cyanosis, Clubbing or edema, No new Rash or bruise        Data Review:    CBC Recent Labs  Lab 11/27/20 1732 11/29/20 0349 11/30/20 0353  WBC 5.0 7.6 11.7*  HGB 15.2 13.9 13.8  HCT 46.9 42.2 40.3  PLT 145* 173 232  MCV 93.6 92.3 91.0  MCH 30.3 30.4 31.2  MCHC 32.4 32.9 34.2  RDW 13.2 13.2 13.1  LYMPHSABS  --  0.7 0.6*  MONOABS  --  0.5 0.8  EOSABS  --  0.0 0.0  BASOSABS  --  0.0 0.0    Recent Labs  Lab 11/27/20 1732 11/28/20 0240 11/29/20 0349 11/30/20 0353  NA 136  --  138 140  K 3.8  --  3.9  4.3  CL 101  --  104 105  CO2 22  --  23 24  GLUCOSE 166*  --  268* 274*  BUN 18  --  20 30*  CREATININE 0.93  --  1.00 1.07  CALCIUM 9.2  --  8.8* 9.1  AST  --  78* 58* 53*  ALT  --  80* 70* 68*  ALKPHOS  --  67 60 56  BILITOT  --  0.9 0.6 0.3  ALBUMIN  --  3.2* 3.0* 2.8*  MG  --   --  2.1 2.5*  CRP  --  6.6* 7.7*  --   DDIMER  --  0.95* 0.74* 0.53*  PROCALCITON  --  0.43 0.34  --   LATICACIDVEN  --  1.9  --   --     ------------------------------------------------------------------------------------------------------------------ Recent Labs    11/28/20 0240  TRIG 95    No results found for: HGBA1C ------------------------------------------------------------------------------------------------------------------ No results for input(s): TSH, T4TOTAL, T3FREE, THYROIDAB in the last 72 hours.  Invalid input(s): FREET3  Cardiac Enzymes No results for input(s): CKMB, TROPONINI, MYOGLOBIN in the last 168 hours.  Invalid input(s): CK ------------------------------------------------------------------------------------------------------------------ No results found for: BNP  Micro Results Recent Results (from the past 240 hour(s))  Blood Culture (routine x 2)     Status: None (Preliminary result)   Collection Time: 11/28/20  2:40 AM   Specimen: BLOOD  Result Value Ref Range Status   Specimen Description BLOOD LEFT ANTECUBITAL  Final   Special Requests   Final    BOTTLES DRAWN AEROBIC AND ANAEROBIC Blood Culture adequate volume   Culture   Final    NO GROWTH 2 DAYS Performed at [facility], [address] [place], [place] [address]    Report Status PENDING  Incomplete  Blood Culture (routine x 2)     Status: None (Preliminary result)   Collection Time: 11/28/20  2:40 AM   Specimen: BLOOD LEFT HAND  Result Value Ref Range Status   Specimen Description BLOOD LEFT HAND  Final   Special Requests   Final    BOTTLES DRAWN AEROBIC AND ANAEROBIC Blood Culture results  may not be optimal due to an inadequate volume of blood received in culture bottles   Culture   Final    NO GROWTH 2 DAYS Performed at [facility], [address], [place], [place] [address]    Report Status PENDING  Incomplete    Radiology Reports CT Angio Chest PE W/Cm &/Or Wo Cm  Result Date: 11/28/2020 CLINICAL DATA:  Chest pain. EXAM: CT ANGIOGRAPHY CHEST WITH CONTRAST TECHNIQUE: Multidetector CT imaging of the chest was performed using the standard protocol during bolus administration of intravenous contrast. Multiplanar CT image reconstructions and MIPs were obtained to evaluate the vascular anatomy. CONTRAST:  28mL OMNIPAQUE IOHEXOL 350 MG/ML SOLN COMPARISON:  None. FINDINGS: Cardiovascular: Some of the most peripheral segmental and subsegmental pulmonary artery branches cannot be definitively characterize due to mild patient breathing motion artifact, however, there is no pulmonary embolism identified within the main, lobar or central segmental pulmonary arteries bilaterally. No pericardial effusion. No thoracic aortic aneurysm or evidence of aortic dissection. Mediastinum/Nodes: No mass or enlarged lymph nodes are seen within the mediastinum. Esophagus is unremarkable. Trachea is unremarkable. Lungs/Pleura: Patchy ground-glass airspace opacities are seen throughout both lungs, RIGHT slightly greater than LEFT, consistent with multifocal pneumonia. No pleural effusion or pneumothorax. Upper Abdomen: Liver is low  in density suggesting fatty infiltration, incompletely imaged. Limited images of the upper abdomen are otherwise unremarkable. Musculoskeletal: No acute or suspicious osseous finding. Mild degenerative spurring within the thoracic spine. Review of the MIP images confirms the above findings. IMPRESSION: 1. Bilateral multifocal pneumonia. 2. No pulmonary embolism seen. 3. Fatty infiltration of the liver. Electronically Signed   By: Bary Richard M.D.   On: 11/28/2020 07:56   DG  Chest Portable 1 View  Result Date: 11/27/2020 CLINICAL DATA:  COVID-19 positive, chest pain EXAM: PORTABLE CHEST 1 VIEW COMPARISON:  Radiograph 11/14/2016 FINDINGS: Heterogeneous mixed consolidative and interstitial opacities present the mid to lower lungs in a slight peripheral predominance compatible reported imaging features of COVID 19 pneumonia. No pneumothorax. No effusion. The cardiomediastinal contours are unremarkable for portable technique. No acute osseous or soft tissue abnormality. IMPRESSION: Appearance compatible with a multifocal pneumonia in the setting of COVID 19. Electronically Signed   By: Kreg Shropshire M.D.   On: 11/27/2020 18:26

## 2020-11-30 NOTE — ED Notes (Signed)
Lunch Tray Ordered @ 1039. 

## 2020-11-30 NOTE — ED Notes (Signed)
Electronic Mssg sent to attending ref New EKG and afib, will now page same

## 2020-12-01 ENCOUNTER — Inpatient Hospital Stay (HOSPITAL_COMMUNITY): Payer: BC Managed Care – PPO

## 2020-12-01 DIAGNOSIS — I4891 Unspecified atrial fibrillation: Secondary | ICD-10-CM

## 2020-12-01 LAB — COMPREHENSIVE METABOLIC PANEL
ALT: 98 U/L — ABNORMAL HIGH (ref 0–44)
AST: 76 U/L — ABNORMAL HIGH (ref 15–41)
Albumin: 2.8 g/dL — ABNORMAL LOW (ref 3.5–5.0)
Alkaline Phosphatase: 61 U/L (ref 38–126)
Anion gap: 13 (ref 5–15)
BUN: 32 mg/dL — ABNORMAL HIGH (ref 6–20)
CO2: 22 mmol/L (ref 22–32)
Calcium: 9 mg/dL (ref 8.9–10.3)
Chloride: 107 mmol/L (ref 98–111)
Creatinine, Ser: 0.92 mg/dL (ref 0.61–1.24)
GFR, Estimated: >60 mL/min (ref >60)
Glucose, Bld: 227 mg/dL — ABNORMAL HIGH (ref 70–99)
Potassium: 4.1 mmol/L (ref 3.5–5.1)
Sodium: 142 mmol/L (ref 135–145)
Total Bilirubin: 0.7 mg/dL (ref 0.3–1.2)
Total Protein: 6.1 g/dL — ABNORMAL LOW (ref 6.5–8.1)

## 2020-12-01 LAB — CBC WITH DIFFERENTIAL/PLATELET
Abs Immature Granulocytes: 0.39 10*3/uL — ABNORMAL HIGH (ref 0.00–0.07)
Basophils Absolute: 0.1 10*3/uL (ref 0.0–0.1)
Basophils Relative: 1 %
Eosinophils Absolute: 0 10*3/uL (ref 0.0–0.5)
Eosinophils Relative: 0 %
HCT: 40.4 % (ref 39.0–52.0)
Hemoglobin: 14.2 g/dL (ref 13.0–17.0)
Immature Granulocytes: 3 %
Lymphocytes Relative: 5 %
Lymphs Abs: 0.5 10*3/uL — ABNORMAL LOW (ref 0.7–4.0)
MCH: 31.1 pg (ref 26.0–34.0)
MCHC: 35.1 g/dL (ref 30.0–36.0)
MCV: 88.6 fL (ref 80.0–100.0)
Monocytes Absolute: 1 10*3/uL (ref 0.1–1.0)
Monocytes Relative: 9 %
Neutro Abs: 9.8 10*3/uL — ABNORMAL HIGH (ref 1.7–7.7)
Neutrophils Relative %: 82 %
Platelets: 279 10*3/uL (ref 150–400)
RBC: 4.56 MIL/uL (ref 4.22–5.81)
RDW: 13.1 % (ref 11.5–15.5)
WBC: 11.9 10*3/uL — ABNORMAL HIGH (ref 4.0–10.5)
nRBC: 0 % (ref 0.0–0.2)

## 2020-12-01 LAB — TSH: TSH: 0.965 u[IU]/mL (ref 0.350–4.500)

## 2020-12-01 LAB — C-REACTIVE PROTEIN: CRP: 1.1 mg/dL — ABNORMAL HIGH (ref <1.0)

## 2020-12-01 LAB — ECHOCARDIOGRAM COMPLETE
Height: 78 in
S' Lateral: 3.4 cm
Weight: 4091.74 oz

## 2020-12-01 LAB — GLUCOSE, CAPILLARY
Glucose-Capillary: 211 mg/dL — ABNORMAL HIGH (ref 70–99)
Glucose-Capillary: 263 mg/dL — ABNORMAL HIGH (ref 70–99)
Glucose-Capillary: 239 mg/dL — ABNORMAL HIGH (ref 70–99)
Glucose-Capillary: 267 mg/dL — ABNORMAL HIGH (ref 70–99)

## 2020-12-01 LAB — TROPONIN I (HIGH SENSITIVITY): Troponin I (High Sensitivity): 7 ng/L (ref <18)

## 2020-12-01 LAB — BRAIN NATRIURETIC PEPTIDE: B Natriuretic Peptide: 195.9 pg/mL — ABNORMAL HIGH (ref 0.0–100.0)

## 2020-12-01 LAB — D-DIMER, QUANTITATIVE: D-Dimer, Quant: 0.63 ug/mL-FEU — ABNORMAL HIGH (ref 0.00–0.50)

## 2020-12-01 MED ORDER — INSULIN ASPART 100 UNIT/ML ~~LOC~~ SOLN
5.0000 [IU] | Freq: Once | SUBCUTANEOUS | Status: AC
  Administered 2020-12-01: 5 [IU] via SUBCUTANEOUS

## 2020-12-01 MED ORDER — INSULIN DETEMIR 100 UNIT/ML ~~LOC~~ SOLN
30.0000 [IU] | Freq: Every day | SUBCUTANEOUS | Status: DC
  Administered 2020-12-01: 30 [IU] via SUBCUTANEOUS
  Filled 2020-12-01 (×2): qty 0.3

## 2020-12-01 MED ORDER — INSULIN ASPART 100 UNIT/ML ~~LOC~~ SOLN: 0.0000 [IU] | Freq: Three times a day (TID) | SUBCUTANEOUS | Status: DC

## 2020-12-01 MED ORDER — APIXABAN 5 MG PO TABS
5.0000 mg | ORAL_TABLET | Freq: Two times a day (BID) | ORAL | Status: DC
  Administered 2020-12-01 – 2020-12-09 (×17): 5 mg via ORAL
  Filled 2020-12-01 (×18): qty 1

## 2020-12-01 MED ORDER — LINAGLIPTIN 5 MG PO TABS
5.0000 mg | ORAL_TABLET | Freq: Every day | ORAL | Status: DC
  Administered 2020-12-01 – 2020-12-09 (×9): 5 mg via ORAL
  Filled 2020-12-01 (×9): qty 1

## 2020-12-01 MED ORDER — BISACODYL 5 MG PO TBEC
10.0000 mg | DELAYED_RELEASE_TABLET | Freq: Once | ORAL | Status: AC
  Administered 2020-12-01: 10 mg via ORAL
  Filled 2020-12-01: qty 2

## 2020-12-01 MED ORDER — POLYETHYLENE GLYCOL 3350 17 G PO PACK
34.0000 g | PACK | Freq: Once | ORAL | Status: AC
  Administered 2020-12-01: 34 g via ORAL
  Filled 2020-12-01: qty 2

## 2020-12-01 NOTE — Progress Notes (Addendum)
   12/01/20 0447  Provider Notification  Provider Name/Title Dr Leafy Half  Date Provider Notified 12/01/20  Time Provider Notified [phone]  Notification Type Page (message on computer)  Notification Reason Other (Comment) (pt on afib, )  Response Other (Comment) (waiting)   New Onset of AFIB-started while in ED.  Please READ DR. Shalhoub's notes and new orders/Plan.

## 2020-12-01 NOTE — Progress Notes (Signed)
Inpatient Diabetes Program Recommendations  AACE/ADA: New Consensus Statement on Inpatient Glycemic Control (2015)  Target Ranges:  Prepandial:   less than 140 mg/dL      Peak postprandial:   less than 180 mg/dL (1-2 hours)      Critically ill patients:  140 - 180 mg/dL   Lab Results  Component Value Date   GLUCAP 263 (H) 12/01/2020   HGBA1C 8.3 (H) 11/30/2020    Review of Glycemic Control Results for JABER, DUNLOW (MRN [id number]) as of 12/01/2020 12:43  Ref. Range 11/30/2020 07:51 11/30/2020 11:16 11/30/2020 17:31 11/30/2020 23:37 12/01/2020 07:53 12/01/2020 12:05  Glucose-Capillary Latest Ref Range: 70 - 99 mg/dL 017 (H) 494 (H) 496 (H) 230 (H) 211 (H) 263 (H)    Inpatient Diabetes Program Recommendations: Insulin - Meal Coverage: Novolog 6 units TID with meals if consumes at least 50%  Will continue to follow while inpatient.  Thank you, Dulce Sellar, RN, BSN Diabetes Coordinator Inpatient Diabetes Program [phone] (team pager from 8a-5p)

## 2020-12-01 NOTE — Progress Notes (Signed)
  Echocardiogram 2D Echocardiogram has been performed.  Alejandro Bowen 12/01/2020, 2:54 PM

## 2020-12-01 NOTE — Progress Notes (Signed)
Requested benefit check for Eliquis BID.  Alejandro Dvorsky LCSW

## 2020-12-01 NOTE — Progress Notes (Signed)
ANTICOAGULATION CONSULT NOTE - Initial Consult  Pharmacy Consult for Eliquis Indication: atrial fibrillation  Allergies  Allergen Reactions  . Alprazolam Other (See Comments)    depression  . Penicillins Hives    Patient Measurements: Height: 6\' 6"  (198.1 cm) Weight: 116 kg (255 lb 12.8 oz) IBW/kg (Calculated) : 91.4  Vital Signs: Temp: 98.4 F (36.9 C) (12/07 0400) Temp Source: Oral (12/07 0400) BP: 123/88 (12/07 0400) Pulse Rate: 66 (12/07 0400)  Labs: Recent Labs    11/29/20 0349 11/29/20 0349 11/30/20 0353 12/01/20 0057  HGB 13.9   < > 13.8 14.2  HCT 42.2  --  40.3 40.4  PLT 173  --  232 279  CREATININE 1.00  --  1.07 0.92   < > = values in this interval not displayed.    Estimated Creatinine Clearance: 128.3 mL/min (by C-G formula based on SCr of 0.92 mg/dL).   Medical History: Past Medical History:  Diagnosis Date  . Anxiety     Medications:  Scheduled:  . vitamin C  500 mg Oral Daily  . aspirin EC  81 mg Oral Daily  . baricitinib  4 mg Oral Daily  . carvedilol  6.25 mg Oral BID WC  . cholecalciferol  1,000 Units Oral Daily  . citalopram  20 mg Oral Daily  . enoxaparin (LOVENOX) injection  60 mg Subcutaneous Daily  . fentaNYL (SUBLIMAZE) injection  50 mcg Intravenous Once  . insulin aspart  0-9 Units Subcutaneous TID WC  . insulin aspart  4 Units Subcutaneous TID WC  . insulin detemir  25 Units Subcutaneous Daily  . Ipratropium-Albuterol  1 puff Inhalation Q6H  . lisinopril  5 mg Oral Daily  . methylPREDNISolone (SOLU-MEDROL) injection  80 mg Intravenous Q8H  . multivitamin with minerals  1 tablet Oral Daily  . pantoprazole  40 mg Oral Daily  . zinc sulfate  220 mg Oral Daily    Assessment: 59 y.o. male with new onset Afib for Eliquis  Plan:  D/C Lovenox Eliquis 5 mg BID  Alejandro Bowen, 59 12/01/2020,5:19 AM

## 2020-12-01 NOTE — Progress Notes (Signed)
PROGRESS NOTE                                                                             PROGRESS NOTE                                                                                                                                                                                                             Patient Demographics:    Alejandro Bowen, is a 59 y.o. male, DOB - [birth year], JGO:[id number]  Outpatient Primary MD for the patient is [name], MD    LOS - 3  Admit date - 11/27/2020    Chief Complaint  Patient presents with  . Covid Positive  . Chest Pain       Brief Narrative     59 y.o. male with medical history significant for obesity, chronic anxiety, essential hypertension, COVID-19 positive test on 11/23/2020 who is admitted due to COVID-19 of pneumonia, with hypoxia, as well patient developed new onset A. fib 11/30/2020.    Subjective:    Alejandro Bowen today cough, some dyspnea with exertion, he denies any palpitation or chest pain.   Assessment  & Plan :    Active Problems:   Pneumonia due to COVID-19 virus   Essential hypertension  Acute Hypoxic Resp. Failure due to Acute Covid 19 Viral Pneumonitis during the ongoing 2020 Covid 19 Pandemic -  -He is unvaccinated -Patient with significant hypoxia, he is on 6 L high flow nasal cannula at rest . -Was encouraged to use incentive spirometry and flutter valve, off the bed to chair. -Continue with IV Solu-Medrol. -Continue with IV remdesivir. -Continue with baricitinib . -Continue to trend inflammatory markers. -Procalcitonin 0.43 on admission, started on IV Rocephin and azithromycin,    SpO2: 90 % O2 Flow Rate (L/min): 6 L/min  Recent Labs  Lab 11/27/20 1732 11/28/20 0240 11/29/20 0349 11/30/20 0353 12/01/20 0057  WBC 5.0  --  7.6 11.7* 11.9*  PLT 145*  --  173 232 279  CRP  --  6.6*  7.7*  --  1.1*  BNP  --   --   --   --  195.9*  DDIMER  --  0.95* 0.74* 0.53*  0.63*  PROCALCITON  --  0.43 0.34  --   --   AST  --  78* 58* 53* 76*  ALT  --  80* 70* 68* 98*  ALKPHOS  --  67 60 56 61  BILITOT  --  0.9 0.6 0.3 0.7  ALBUMIN  --  3.2* 3.0* 2.8* 2.8*  LATICACIDVEN  --  1.9  --   --   --        ABG  No results found for: PHART, PCO2ART, PO2ART, HCO3, TCO2, ACIDBASEDEF, O2SAT  Transaminitis -Due to Covid, trending down, continue to monitor  Hyperglycemia/diabetes mellitus -A1c is elevated at 8.3, which is diagnostic of diabetes mellitus, remains uncontrolled, so we will increase Levemir to 30 units daily, continue with sliding scale and before meals NovoLog. -We will start on Tradjenta.  New onset A. Fib -Patient started have A. fib 12/6, which CHA2DS2-VASc score is 2, started on Eliquis for anticoagulation, follow on TSH and 2D echo  Pleuritic chest pain in the setting of COVID-19 viral pneumonia -Related to cough, troponins negative x2, CTA chest negative for PE.  Essential hypertension -Acceptable, continue with Coreg and lisinopril  Obesity BMI 31 Recommend weight loss outpatient with regular physical activity and healthy dieting.  Chronic anxiety Resume home Celexa     Condition -Guarded  Family communication: Wife has been updated 12/4, 12/5,12/7  Code Status :  Full  Consults  :  none  Procedures  :  none  Disposition Plan  :    Status is: Inpatient  Remains inpatient appropriate because:Hemodynamically unstable and IV treatments appropriate due to intensity of illness or inability to take PO   Dispo:  Patient From: Home  Planned Disposition: Home  Expected discharge date: 12/02/2020  Medically stable for discharge: No       DVT Prophylaxis  :  Lovenox -  Lab Results  Component Value Date   PLT 279 12/01/2020    Diet :  Diet Order            Diet heart healthy/carb modified Room service appropriate? Yes; Fluid consistency: Thin  Diet effective now                  Inpatient  Medications  Scheduled Meds: . apixaban  5 mg Oral BID  . vitamin C  500 mg Oral Daily  . baricitinib  4 mg Oral Daily  . carvedilol  6.25 mg Oral BID WC  . cholecalciferol  1,000 Units Oral Daily  . citalopram  20 mg Oral Daily  . fentaNYL (SUBLIMAZE) injection  50 mcg Intravenous Once  . insulin aspart  0-9 Units Subcutaneous TID WC  . insulin aspart  4 Units Subcutaneous TID WC  . insulin detemir  30 Units Subcutaneous Daily  . Ipratropium-Albuterol  1 puff Inhalation Q6H  . lisinopril  5 mg Oral Daily  . methylPREDNISolone (SOLU-MEDROL) injection  80 mg Intravenous Q8H  . multivitamin with minerals  1 tablet Oral Daily  . pantoprazole  40 mg Oral Daily  . zinc sulfate  220 mg Oral Daily   Continuous Infusions: . azithromycin 500 mg (12/01/20 0959)  . cefTRIAXone (ROCEPHIN)  IV 2 g (12/01/20 1132)  . remdesivir 100 mg in NS 100 mL 100 mg (12/01/20 0908)   PRN Meds:.guaiFENesin-dextromethorphan  Antibiotics  :  Anti-infectives (From admission, onward)   Start     Dose/Rate Route Frequency Ordered Stop   11/29/20 1000  remdesivir 100 mg in sodium chloride 0.9 % 100 mL IVPB       "Followed by" Linked Group Details   100 mg 200 mL/hr over 30 Minutes Intravenous Daily 11/28/20 0155 12/03/20 0959   11/28/20 1100  cefTRIAXone (ROCEPHIN) 2 g in sodium chloride 0.9 % 100 mL IVPB        2 g 200 mL/hr over 30 Minutes Intravenous Every 24 hours 11/28/20 1059 12/02/20 2359   11/28/20 0830  azithromycin (ZITHROMAX) 500 mg in sodium chloride 0.9 % 250 mL IVPB        500 mg 250 mL/hr over 60 Minutes Intravenous Daily 11/28/20 0808 12/02/20 2359   11/28/20 0830  cefTRIAXone (ROCEPHIN) 2 g in sodium chloride 0.9 % 100 mL IVPB  Status:  Discontinued        2 g 200 mL/hr over 30 Minutes Intravenous Daily 11/28/20 0808 11/28/20 0811   11/28/20 0300  remdesivir 200 mg in sodium chloride 0.9% 250 mL IVPB       "Followed by" Linked Group Details   200 mg 580 mL/hr over 30 Minutes  Intravenous Once 11/28/20 0155 11/28/20 0415        Mliss Fritzawood Ivadell Gaul M.D on 12/01/2020 at 11:44 AM  To page go to [email or web address]  [facility] -  Office  [phone]    Objective:   Vitals:   12/01/20 0015 12/01/20 0400 12/01/20 0500 12/01/20 0700  BP: 139/88 123/88  136/73  Pulse: 88 66  85  Resp: (!) 22 20  20   Temp: 97.8 F (36.6 C) 98.4 F (36.9 C)  98.2 F (36.8 C)  TempSrc: Oral Oral  Oral  SpO2: 91% 97%  90%  Weight: 116 kg  116 kg   Height: 6\' 6"  (1.981 m)       Wt Readings from Last 3 Encounters:  12/01/20 116 kg  11/14/16 122.5 kg     Intake/Output Summary (Last 24 hours) at 12/01/2020 1144 Last data filed at 12/01/2020 0700 Gross per 24 hour  Intake 972.34 ml  Output 1100 ml  Net -127.66 ml     Physical Exam  Awake Alert, Oriented X 3, No new F.N deficits, Normal affect Symmetrical Chest wall movement, Good air movement bilaterally, scattered rales Irregular irregular,No Gallops,Rubs or new Murmurs, No Parasternal Heave +ve B.Sounds, Abd Soft, No tenderness, No rebound - guarding or rigidity. No Cyanosis, Clubbing or edema, No new Rash or bruise         Data Review:    CBC Recent Labs  Lab 11/27/20 1732 11/29/20 0349 11/30/20 0353 12/01/20 0057  WBC 5.0 7.6 11.7* 11.9*  HGB 15.2 13.9 13.8 14.2  HCT 46.9 42.2 40.3 40.4  PLT 145* 173 232 279  MCV 93.6 92.3 91.0 88.6  MCH 30.3 30.4 31.2 31.1  MCHC 32.4 32.9 34.2 35.1  RDW 13.2 13.2 13.1 13.1  LYMPHSABS  --  0.7 0.6* 0.5*  MONOABS  --  0.5 0.8 1.0  EOSABS  --  0.0 0.0 0.0  BASOSABS  --  0.0 0.0 0.1    Recent Labs  Lab 11/27/20 1732 11/28/20 0240 11/29/20 0349 11/30/20 0353 12/01/20 0057  NA 136  --  138 140 142  K 3.8  --  3.9 4.3 4.1  CL 101  --  104 105 107  CO2 22  --  23 24 22   GLUCOSE 166*  --  268* 274* 227*  BUN 18  --  20 30* 32*  CREATININE 0.93  --  1.00 1.07 0.92  CALCIUM 9.2  --  8.8* 9.1 9.0  AST  --  78* 58* 53* 76*  ALT  --  80* 70* 68* 98*   ALKPHOS  --  67 60 56 61  BILITOT  --  0.9 0.6 0.3 0.7  ALBUMIN  --  3.2* 3.0* 2.8* 2.8*  MG  --   --  2.1 2.5*  --   CRP  --  6.6* 7.7*  --  1.1*  DDIMER  --  0.95* 0.74* 0.53* 0.63*  PROCALCITON  --  0.43 0.34  --   --   LATICACIDVEN  --  1.9  --   --   --   TSH  --   --   --   --  0.965  HGBA1C  --   --   --  8.3*  --   BNP  --   --   --   --  195.9*    ------------------------------------------------------------------------------------------------------------------ No results for input(s): CHOL, HDL, LDLCALC, TRIG, CHOLHDL, LDLDIRECT in the last 72 hours.  Lab Results  Component Value Date   HGBA1C 8.3 (H) 11/30/2020   ------------------------------------------------------------------------------------------------------------------ Recent Labs    12/01/20 0057  TSH 0.965    Cardiac Enzymes No results for input(s): CKMB, TROPONINI, MYOGLOBIN in the last 168 hours.  Invalid input(s): CK ------------------------------------------------------------------------------------------------------------------    Component Value Date/Time   BNP 195.9 (H) 12/01/2020 0057    Micro Results Recent Results (from the past 240 hour(s))  Blood Culture (routine x 2)     Status: None (Preliminary result)   Collection Time: 11/28/20  2:40 AM   Specimen: BLOOD  Result Value Ref Range Status   Specimen Description BLOOD LEFT ANTECUBITAL  Final   Special Requests   Final    BOTTLES DRAWN AEROBIC AND ANAEROBIC Blood Culture adequate volume   Culture   Final    NO GROWTH 3 DAYS Performed at [facility], [address] [place], [place] [address]    Report Status PENDING  Incomplete  Blood Culture (routine x 2)     Status: None (Preliminary result)   Collection Time: 11/28/20  2:40 AM   Specimen: BLOOD LEFT HAND  Result Value Ref Range Status   Specimen Description BLOOD LEFT HAND  Final   Special Requests   Final    BOTTLES DRAWN AEROBIC AND ANAEROBIC Blood Culture results  may not be optimal due to an inadequate volume of blood received in culture bottles   Culture   Final    NO GROWTH 3 DAYS Performed at [facility], [address] [place], [place] [address]    Report Status PENDING  Incomplete    Radiology Reports CT Angio Chest PE W/Cm &/Or Wo Cm  Result Date: 11/28/2020 CLINICAL DATA:  Chest pain. EXAM: CT ANGIOGRAPHY CHEST WITH CONTRAST TECHNIQUE: Multidetector CT imaging of the chest was performed using the standard protocol during bolus administration of intravenous contrast. Multiplanar CT image reconstructions and MIPs were obtained to evaluate the vascular anatomy. CONTRAST:  67mL OMNIPAQUE IOHEXOL 350 MG/ML SOLN COMPARISON:  None. FINDINGS: Cardiovascular: Some of the most peripheral segmental and subsegmental pulmonary artery branches cannot be definitively characterize due to mild patient breathing motion artifact, however, there is no pulmonary embolism identified within the main, lobar or central segmental pulmonary arteries bilaterally. No pericardial effusion. No thoracic aortic aneurysm or evidence  of aortic dissection. Mediastinum/Nodes: No mass or enlarged lymph nodes are seen within the mediastinum. Esophagus is unremarkable. Trachea is unremarkable. Lungs/Pleura: Patchy ground-glass airspace opacities are seen throughout both lungs, RIGHT slightly greater than LEFT, consistent with multifocal pneumonia. No pleural effusion or pneumothorax. Upper Abdomen: Liver is low in density suggesting fatty infiltration, incompletely imaged. Limited images of the upper abdomen are otherwise unremarkable. Musculoskeletal: No acute or suspicious osseous finding. Mild degenerative spurring within the thoracic spine. Review of the MIP images confirms the above findings. IMPRESSION: 1. Bilateral multifocal pneumonia. 2. No pulmonary embolism seen. 3. Fatty infiltration of the liver. Electronically Signed   By: Bary Richard M.D.   On: 11/28/2020 07:56   DG  Chest Portable 1 View  Result Date: 11/27/2020 CLINICAL DATA:  COVID-19 positive, chest pain EXAM: PORTABLE CHEST 1 VIEW COMPARISON:  Radiograph 11/14/2016 FINDINGS: Heterogeneous mixed consolidative and interstitial opacities present the mid to lower lungs in a slight peripheral predominance compatible reported imaging features of COVID 19 pneumonia. No pneumothorax. No effusion. The cardiomediastinal contours are unremarkable for portable technique. No acute osseous or soft tissue abnormality. IMPRESSION: Appearance compatible with a multifocal pneumonia in the setting of COVID 19. Electronically Signed   By: Kreg Shropshire M.D.   On: 11/27/2020 18:26

## 2020-12-01 NOTE — Progress Notes (Signed)
HOSPITAL MEDICINE OVERNIGHT EVENT NOTE    Nursing has notified me that patient has been in atrial fibrillation since at least yesterday afternoon remains in atrial fibrillation currently.  Additionally, EKG performed on 12/6 at 10 AM seems to identify atrial fibrillation as well.  Patient happens to be rate controlled on telemetry currently.  Patient is denying any chest pain or palpitations.  Patient is hemodynamically stable.  Obtaining TSH, obtaining troponin.  Last magnesium obtained during this hospitalization within normal limits.  Ordering echocardiogram for this morning.  We will abstain from initiating AV nodal blocking agents considering patient's heart rate is currently in the 60s, currently in atrial fibrillation on the monitor per nursing.  CHA2DS2-VASc score is 2 (hypertension and diabetes with hgb a1c 8.3%) indicating that patient should be on anticoagulation.  Patient has no history of gastrointestinal or intracranial hemorrhage.  Will initiate Eliquis 5 mg twice daily.  Marinda Elk  MD [facility]

## 2020-12-02 DIAGNOSIS — J9601 Acute respiratory failure with hypoxia: Secondary | ICD-10-CM

## 2020-12-02 DIAGNOSIS — I10 Essential (primary) hypertension: Secondary | ICD-10-CM

## 2020-12-02 LAB — D-DIMER, QUANTITATIVE: D-Dimer, Quant: 0.66 ug/mL-FEU — ABNORMAL HIGH (ref 0.00–0.50)

## 2020-12-02 LAB — COMPREHENSIVE METABOLIC PANEL
ALT: 118 U/L — ABNORMAL HIGH (ref 0–44)
AST: 66 U/L — ABNORMAL HIGH (ref 15–41)
Albumin: 2.9 g/dL — ABNORMAL LOW (ref 3.5–5.0)
Alkaline Phosphatase: 69 U/L (ref 38–126)
Anion gap: 10 (ref 5–15)
BUN: 38 mg/dL — ABNORMAL HIGH (ref 6–20)
CO2: 23 mmol/L (ref 22–32)
Calcium: 9.1 mg/dL (ref 8.9–10.3)
Chloride: 107 mmol/L (ref 98–111)
Creatinine, Ser: 1.05 mg/dL (ref 0.61–1.24)
GFR, Estimated: >60 mL/min (ref >60)
Glucose, Bld: 218 mg/dL — ABNORMAL HIGH (ref 70–99)
Potassium: 4.3 mmol/L (ref 3.5–5.1)
Sodium: 140 mmol/L (ref 135–145)
Total Bilirubin: 0.7 mg/dL (ref 0.3–1.2)
Total Protein: 6.1 g/dL — ABNORMAL LOW (ref 6.5–8.1)

## 2020-12-02 LAB — GLUCOSE, CAPILLARY
Glucose-Capillary: 207 mg/dL — ABNORMAL HIGH (ref 70–99)
Glucose-Capillary: 248 mg/dL — ABNORMAL HIGH (ref 70–99)
Glucose-Capillary: 259 mg/dL — ABNORMAL HIGH (ref 70–99)
Glucose-Capillary: 268 mg/dL — ABNORMAL HIGH (ref 70–99)

## 2020-12-02 LAB — CBC WITH DIFFERENTIAL/PLATELET
Abs Immature Granulocytes: 0.65 10*3/uL — ABNORMAL HIGH (ref 0.00–0.07)
Basophils Absolute: 0.1 10*3/uL (ref 0.0–0.1)
Basophils Relative: 1 %
Eosinophils Absolute: 0 10*3/uL (ref 0.0–0.5)
Eosinophils Relative: 0 %
HCT: 42.7 % (ref 39.0–52.0)
Hemoglobin: 14.9 g/dL (ref 13.0–17.0)
Immature Granulocytes: 5 %
Lymphocytes Relative: 4 %
Lymphs Abs: 0.6 10*3/uL — ABNORMAL LOW (ref 0.7–4.0)
MCH: 30.8 pg (ref 26.0–34.0)
MCHC: 34.9 g/dL (ref 30.0–36.0)
MCV: 88.2 fL (ref 80.0–100.0)
Monocytes Absolute: 0.9 10*3/uL (ref 0.1–1.0)
Monocytes Relative: 7 %
Neutro Abs: 11.3 10*3/uL — ABNORMAL HIGH (ref 1.7–7.7)
Neutrophils Relative %: 83 %
Platelets: 296 10*3/uL (ref 150–400)
RBC: 4.84 MIL/uL (ref 4.22–5.81)
RDW: 13.2 % (ref 11.5–15.5)
WBC: 13.5 10*3/uL — ABNORMAL HIGH (ref 4.0–10.5)
nRBC: 0.3 % — ABNORMAL HIGH (ref 0.0–0.2)

## 2020-12-02 LAB — C-REACTIVE PROTEIN: CRP: 0.6 mg/dL (ref <1.0)

## 2020-12-02 MED ORDER — INSULIN ASPART 100 UNIT/ML ~~LOC~~ SOLN
6.0000 [IU] | Freq: Three times a day (TID) | SUBCUTANEOUS | Status: DC
  Administered 2020-12-02 – 2020-12-08 (×21): 6 [IU] via SUBCUTANEOUS

## 2020-12-02 MED ORDER — INSULIN ASPART 100 UNIT/ML ~~LOC~~ SOLN
0.0000 [IU] | Freq: Three times a day (TID) | SUBCUTANEOUS | Status: DC
  Administered 2020-12-02 (×2): 7 [IU] via SUBCUTANEOUS
  Administered 2020-12-02 – 2020-12-03 (×2): 11 [IU] via SUBCUTANEOUS
  Administered 2020-12-03 (×2): 7 [IU] via SUBCUTANEOUS
  Administered 2020-12-04 (×2): 11 [IU] via SUBCUTANEOUS
  Administered 2020-12-04: 4 [IU] via SUBCUTANEOUS
  Administered 2020-12-05 (×2): 7 [IU] via SUBCUTANEOUS
  Administered 2020-12-05: 09:00:00 4 [IU] via SUBCUTANEOUS
  Administered 2020-12-06: 18:00:00 11 [IU] via SUBCUTANEOUS
  Administered 2020-12-06: 08:00:00 4 [IU] via SUBCUTANEOUS
  Administered 2020-12-06: 13:00:00 3 [IU] via SUBCUTANEOUS
  Administered 2020-12-07: 13:00:00 11 [IU] via SUBCUTANEOUS
  Administered 2020-12-07 (×2): 4 [IU] via SUBCUTANEOUS
  Administered 2020-12-08: 08:00:00 3 [IU] via SUBCUTANEOUS
  Administered 2020-12-08: 13:00:00 7 [IU] via SUBCUTANEOUS

## 2020-12-02 MED ORDER — INSULIN DETEMIR 100 UNIT/ML ~~LOC~~ SOLN
35.0000 [IU] | Freq: Every day | SUBCUTANEOUS | Status: DC
  Administered 2020-12-02 – 2020-12-08 (×7): 35 [IU] via SUBCUTANEOUS
  Filled 2020-12-02 (×8): qty 0.35

## 2020-12-02 MED ORDER — INSULIN ASPART 100 UNIT/ML ~~LOC~~ SOLN
0.0000 [IU] | Freq: Every day | SUBCUTANEOUS | Status: DC
  Administered 2020-12-02 – 2020-12-04 (×3): 3 [IU] via SUBCUTANEOUS
  Administered 2020-12-05: 22:00:00 2 [IU] via SUBCUTANEOUS
  Administered 2020-12-06: 22:00:00 3 [IU] via SUBCUTANEOUS

## 2020-12-02 NOTE — Progress Notes (Signed)
PROGRESS NOTE                                                                                                                                                                                                             Patient Demographics:    Alejandro Bowen, is a 59 y.o. male, DOB - May 02, 1961, ONG:[id number]  Outpatient Primary MD for the patient is [name], MD   Admit date - 11/27/2020   LOS - 4  Chief Complaint  Patient presents with  . Covid Positive  . Chest Pain       Brief Narrative: Patient is a 59 y.o. male with PMHx of HTN, chronic anxiety, obesity-who tested positive for COVID-19 on 11/29-admitted for acute hypoxic respiratory failure due to COVID-19 pneumonia-further hospital course complicated by atrial fibrillation with RVR.  See below for further details.  COVID-19 vaccinated status: Unvaccinated  Significant Events: 11/29>> COVID-19 positive (results available in care everywhere) 12/4>> Admit to [facility] for hypoxia due to COVID-19  Significant studies: 12/3>> chest x-ray: Multifocal pneumonia 12/4>> CTA chest: Bilateral multifocal pneumonia, no PE. 12/7>> Echo: EF 60-65%  COVID-19 medications: Steroids: 12/3>> Remdesivir: 12/3>> 12/8 Baricitinib: 12/4>>  Antibiotics: Rocephin: 12/4>> 12/8 Zithromax: 12/4>> 12/8  Microbiology data: 12/4 >>blood culture: No growth  Procedures: None  Consults: None  DVT prophylaxis: apixaban (ELIQUIS) tablet 5 mg      Subjective:    Alejandro Bowen today feels better-he was titrated down to 6 L of oxygen.   Assessment  & Plan :   Acute Hypoxic Resp Failure due to Covid 19 Viral pneumonia: Continues to have severe hypoxemia-stable on 6 L of HFNC today.  No signs of volume overload-continue steroids/baricitinib-has completed a course of Remdesivir.  Doubt bacterial pneumonia-but was empirically placed on a 5-day course of  Rocephin/Zithromax.  Fever: afebrile O2 requirements:  SpO2: 90 % O2 Flow Rate (L/min): 6 L/min   COVID-19 Labs: Recent Labs    11/30/20 0353 12/01/20 0057 12/02/20 0147  DDIMER 0.53* 0.63* 0.66*  CRP  --  1.1* 0.6       Component Value Date/Time   BNP 195.9 (H) 12/01/2020 0057    Recent Labs  Lab 11/28/20 0240 11/29/20 0349  PROCALCITON 0.43 0.34    No results found for: SARSCOV2NAA    Prone/Incentive Spirometry: encouraged patient to lie prone for 3-4  hours at a time for a total of 16 hours a day, and to encourage incentive spirometry use 3-4/hour  Transaminitis: Mild-stable for close follow-up for now.  Etiology likely due to COVID-19 related inflammation.  A. fib with RVR: Rate controlled with Coreg-anticoagulated with Eliquis.  Needs outpatient follow-up with cardiology-for consideration for cardioversion if he remains in atrial fibrillation.  HTN: BP controlled-continue lisinopril and Coreg.  Follow and adjust  DM-2 (A1c 8.3 on 12/6) with uncontrolled hyperglycemia due to steroid use: CBGs remain uncontrolled-increase Lantus to 35 units, increase Premeal NovoLog to 6 units, change to resistant SSI scale.  Follow and adjust.   Recent Labs    12/01/20 2100 12/02/20 0743 12/02/20 1212  GLUCAP 267* 207* 248*    Depression/anxiety: Appears stable-continue Celexa.  GI prophylaxis: PPI  ABG: No results found for: PHART, PCO2ART, PO2ART, HCO3, TCO2, ACIDBASEDEF, O2SAT  Vent Settings: N/A  Condition - Extremely Guarded  Family Communication  :  Spouse [name] [phone]) updated over the phone on 12/8  Code Status :  Full Code  Diet :  Diet Order            Diet heart healthy/carb modified Room service appropriate? Yes; Fluid consistency: Thin  Diet effective now                  Disposition Plan  :   Status is: Inpatient  Remains inpatient appropriate because:Inpatient level of care appropriate due to severity of  illness   Dispo:  Patient From: Home  Planned Disposition: Home  Expected discharge date: 12/05/20  Medically stable for discharge: No  Barriers to discharge: Hypoxia requiring O2 supplementation/complete 5 days of IV Remdesivir  Antimicorbials  :    Anti-infectives (From admission, onward)   Start     Dose/Rate Route Frequency Ordered Stop   11/29/20 1000  remdesivir 100 mg in sodium chloride 0.9 % 100 mL IVPB       "Followed by" Linked Group Details   100 mg 200 mL/hr over 30 Minutes Intravenous Daily 11/28/20 0155 12/02/20 0915   11/28/20 1100  cefTRIAXone (ROCEPHIN) 2 g in sodium chloride 0.9 % 100 mL IVPB        2 g 200 mL/hr over 30 Minutes Intravenous Every 24 hours 11/28/20 1059 12/02/20 1220   11/28/20 0830  azithromycin (ZITHROMAX) 500 mg in sodium chloride 0.9 % 250 mL IVPB        500 mg 250 mL/hr over 60 Minutes Intravenous Daily 11/28/20 0808 12/02/20 1115   11/28/20 0830  cefTRIAXone (ROCEPHIN) 2 g in sodium chloride 0.9 % 100 mL IVPB  Status:  Discontinued        2 g 200 mL/hr over 30 Minutes Intravenous Daily 11/28/20 0808 11/28/20 0811   11/28/20 0300  remdesivir 200 mg in sodium chloride 0.9% 250 mL IVPB       "Followed by" Linked Group Details   200 mg 580 mL/hr over 30 Minutes Intravenous Once 11/28/20 0155 11/28/20 0415      Inpatient Medications  Scheduled Meds: . apixaban  5 mg Oral BID  . vitamin C  500 mg Oral Daily  . baricitinib  4 mg Oral Daily  . carvedilol  6.25 mg Oral BID WC  . cholecalciferol  1,000 Units Oral Daily  . citalopram  20 mg Oral Daily  . fentaNYL (SUBLIMAZE) injection  50 mcg Intravenous Once  . insulin aspart  0-20 Units Subcutaneous TID WC  . insulin aspart  0-5 Units Subcutaneous QHS  .  insulin aspart  6 Units Subcutaneous TID WC  . insulin detemir  35 Units Subcutaneous Daily  . Ipratropium-Albuterol  1 puff Inhalation Q6H  . linagliptin  5 mg Oral Daily  . lisinopril  5 mg Oral Daily  . methylPREDNISolone  (SOLU-MEDROL) injection  80 mg Intravenous Q8H  . multivitamin with minerals  1 tablet Oral Daily  . pantoprazole  40 mg Oral Daily  . zinc sulfate  220 mg Oral Daily   Continuous Infusions: PRN Meds:.guaiFENesin-dextromethorphan   Time Spent in minutes  25  See all Orders from today for further details   Jeoffrey MassedShanker Nyriah Coote M.D on 12/02/2020 at 1:29 PM  To page go to [email or web address] - use universal password  [facility] -  Office  [phone]    Objective:   Vitals:   12/01/20 1708 12/01/20 2000 12/02/20 0439 12/02/20 0743  BP: 139/87 135/88 131/76 (!) 144/95  Pulse: 88 96 85 87  Resp: 20 20 18 19   Temp: 98.4 F (36.9 C) 98.2 F (36.8 C) 98.1 F (36.7 C) 98.1 F (36.7 C)  TempSrc: Oral Oral Oral   SpO2: 92% 98% 91% 90%  Weight:   106.3 kg   Height:        Wt Readings from Last 3 Encounters:  12/02/20 106.3 kg  11/14/16 122.5 kg     Intake/Output Summary (Last 24 hours) at 12/02/2020 1329 Last data filed at 12/02/2020 1000 Gross per 24 hour  Intake 720 ml  Output 500 ml  Net 220 ml     Physical Exam Gen Exam:Alert awake-not in any distress HEENT:atraumatic, normocephalic Chest: B/L clear to auscultation anteriorly CVS:S1S2 regular Abdomen:soft non tender, non distended Extremities:no edema Neurology: Non focal Skin: no rash   Data Review:    CBC Recent Labs  Lab 11/27/20 1732 11/29/20 0349 11/30/20 0353 12/01/20 0057 12/02/20 0147  WBC 5.0 7.6 11.7* 11.9* 13.5*  HGB 15.2 13.9 13.8 14.2 14.9  HCT 46.9 42.2 40.3 40.4 42.7  PLT 145* 173 232 279 296  MCV 93.6 92.3 91.0 88.6 88.2  MCH 30.3 30.4 31.2 31.1 30.8  MCHC 32.4 32.9 34.2 35.1 34.9  RDW 13.2 13.2 13.1 13.1 13.2  LYMPHSABS  --  0.7 0.6* 0.5* 0.6*  MONOABS  --  0.5 0.8 1.0 0.9  EOSABS  --  0.0 0.0 0.0 0.0  BASOSABS  --  0.0 0.0 0.1 0.1    Chemistries  Recent Labs  Lab 11/27/20 1732 11/28/20 0240 11/29/20 0349 11/30/20 0353 12/01/20 0057 12/02/20 0147  NA 136  --  138 140  142 140  K 3.8  --  3.9 4.3 4.1 4.3  CL 101  --  104 105 107 107  CO2 22  --  23 24 22 23   GLUCOSE 166*  --  268* 274* 227* 218*  BUN 18  --  20 30* 32* 38*  CREATININE 0.93  --  1.00 1.07 0.92 1.05  CALCIUM 9.2  --  8.8* 9.1 9.0 9.1  MG  --   --  2.1 2.5*  --   --   AST  --  78* 58* 53* 76* 66*  ALT  --  80* 70* 68* 98* 118*  ALKPHOS  --  67 60 56 61 69  BILITOT  --  0.9 0.6 0.3 0.7 0.7   ------------------------------------------------------------------------------------------------------------------ No results for input(s): CHOL, HDL, LDLCALC, TRIG, CHOLHDL, LDLDIRECT in the last 72 hours.  Lab Results  Component Value Date   HGBA1C 8.3 (H) 11/30/2020   ------------------------------------------------------------------------------------------------------------------  Recent Labs    12/01/20 0057  TSH 0.965   ------------------------------------------------------------------------------------------------------------------ No results for input(s): VITAMINB12, FOLATE, FERRITIN, TIBC, IRON, RETICCTPCT in the last 72 hours.  Coagulation profile No results for input(s): INR, PROTIME in the last 168 hours.  Recent Labs    12/01/20 0057 12/02/20 0147  DDIMER 0.63* 0.66*    Cardiac Enzymes No results for input(s): CKMB, TROPONINI, MYOGLOBIN in the last 168 hours.  Invalid input(s): CK ------------------------------------------------------------------------------------------------------------------    Component Value Date/Time   BNP 195.9 (H) 12/01/2020 0057    Micro Results Recent Results (from the past 240 hour(s))  Blood Culture (routine x 2)     Status: None (Preliminary result)   Collection Time: 11/28/20  2:40 AM   Specimen: BLOOD  Result Value Ref Range Status   Specimen Description BLOOD LEFT ANTECUBITAL  Final   Special Requests   Final    BOTTLES DRAWN AEROBIC AND ANAEROBIC Blood Culture adequate volume   Culture   Final    NO GROWTH 4 DAYS Performed at  [facility], [address] [place], [place] [address]    Report Status PENDING  Incomplete  Blood Culture (routine x 2)     Status: None (Preliminary result)   Collection Time: 11/28/20  2:40 AM   Specimen: BLOOD LEFT HAND  Result Value Ref Range Status   Specimen Description BLOOD LEFT HAND  Final   Special Requests   Final    BOTTLES DRAWN AEROBIC AND ANAEROBIC Blood Culture results may not be optimal due to an inadequate volume of blood received in culture bottles   Culture   Final    NO GROWTH 4 DAYS Performed at [facility], [address], [place], [place] [address]    Report Status PENDING  Incomplete    Radiology Reports CT Angio Chest PE W/Cm &/Or Wo Cm  Result Date: 11/28/2020 CLINICAL DATA:  Chest pain. EXAM: CT ANGIOGRAPHY CHEST WITH CONTRAST TECHNIQUE: Multidetector CT imaging of the chest was performed using the standard protocol during bolus administration of intravenous contrast. Multiplanar CT image reconstructions and MIPs were obtained to evaluate the vascular anatomy. CONTRAST:  102mL OMNIPAQUE IOHEXOL 350 MG/ML SOLN COMPARISON:  None. FINDINGS: Cardiovascular: Some of the most peripheral segmental and subsegmental pulmonary artery branches cannot be definitively characterize due to mild patient breathing motion artifact, however, there is no pulmonary embolism identified within the main, lobar or central segmental pulmonary arteries bilaterally. No pericardial effusion. No thoracic aortic aneurysm or evidence of aortic dissection. Mediastinum/Nodes: No mass or enlarged lymph nodes are seen within the mediastinum. Esophagus is unremarkable. Trachea is unremarkable. Lungs/Pleura: Patchy ground-glass airspace opacities are seen throughout both lungs, RIGHT slightly greater than LEFT, consistent with multifocal pneumonia. No pleural effusion or pneumothorax. Upper Abdomen: Liver is low in density suggesting fatty infiltration, incompletely imaged. Limited images of  the upper abdomen are otherwise unremarkable. Musculoskeletal: No acute or suspicious osseous finding. Mild degenerative spurring within the thoracic spine. Review of the MIP images confirms the above findings. IMPRESSION: 1. Bilateral multifocal pneumonia. 2. No pulmonary embolism seen. 3. Fatty infiltration of the liver. Electronically Signed   By: Bary Richard M.D.   On: 11/28/2020 07:56   DG Chest Portable 1 View  Result Date: 11/27/2020 CLINICAL DATA:  COVID-19 positive, chest pain EXAM: PORTABLE CHEST 1 VIEW COMPARISON:  Radiograph 11/14/2016 FINDINGS: Heterogeneous mixed consolidative and interstitial opacities present the mid to lower lungs in a slight peripheral predominance compatible reported imaging features of COVID  19 pneumonia. No pneumothorax. No effusion. The cardiomediastinal contours are unremarkable for portable technique. No acute osseous or soft tissue abnormality. IMPRESSION: Appearance compatible with a multifocal pneumonia in the setting of COVID 19. Electronically Signed   By: Kreg Shropshire M.D.   On: 11/27/2020 18:26   ECHOCARDIOGRAM COMPLETE  Result Date: 12/01/2020    ECHOCARDIOGRAM REPORT   Patient Name:   Alejandro Bowen Date of Exam: 12/01/2020 Medical Rec #:  [id number]    Height:       78.0 in Accession #:    [id number]   Weight:       255.7 lb Date of Birth:  [birth year]   BSA:          2.506 m Patient Age:    58 years     BP:           123/88 mmHg Patient Gender: M            HR:           80 bpm. Exam Location:  Inpatient Procedure: 2D Echo, Cardiac Doppler and Color Doppler Indications:    Atrial Fibrillation 427.31 / I48.91  History:        Patient has no prior history of Echocardiogram examinations.                 Risk Factors:Hypertension.  Sonographer:    Eulah Pont RDCS Referring Phys: [id number] Deno Lunger SHALHOUB IMPRESSIONS  1. Left ventricular ejection fraction, by estimation, is 60 to 65%. The left ventricle has normal function. The left ventricle has no regional  wall motion abnormalities. Left ventricular diastolic function could not be evaluated.  2. Right ventricular systolic function is normal. The right ventricular size is normal.  3. The mitral valve is normal in structure. No evidence of mitral valve regurgitation. No evidence of mitral stenosis.  4. The aortic valve is normal in structure. Aortic valve regurgitation is not visualized. No aortic stenosis is present.  5. The inferior vena cava is normal in size with greater than 50% respiratory variability, suggesting right atrial pressure of 3 mmHg. FINDINGS  Left Ventricle: Left ventricular ejection fraction, by estimation, is 60 to 65%. The left ventricle has normal function. The left ventricle has no regional wall motion abnormalities. The left ventricular internal cavity size was normal in size. There is  no left ventricular hypertrophy. Left ventricular diastolic function could not be evaluated due to atrial fibrillation. Left ventricular diastolic function could not be evaluated. Right Ventricle: The right ventricular size is normal. No increase in right ventricular wall thickness. Right ventricular systolic function is normal. Left Atrium: Left atrial size was normal in size. Right Atrium: Right atrial size was normal in size. Pericardium: There is no evidence of pericardial effusion. Mitral Valve: The mitral valve is normal in structure. No evidence of mitral valve regurgitation. No evidence of mitral valve stenosis. Tricuspid Valve: The tricuspid valve is normal in structure. Tricuspid valve regurgitation is not demonstrated. No evidence of tricuspid stenosis. Aortic Valve: The aortic valve is normal in structure. Aortic valve regurgitation is not visualized. No aortic stenosis is present. Pulmonic Valve: The pulmonic valve was normal in structure. Pulmonic valve regurgitation is not visualized. No evidence of pulmonic stenosis. Aorta: The aortic root is normal in size and structure. Venous: The inferior vena  cava is normal in size with greater than 50% respiratory variability, suggesting right atrial pressure of 3 mmHg. IAS/Shunts: No atrial level shunt detected by color flow Doppler.  LEFT  VENTRICLE PLAX 2D LVIDd:         4.90 cm LVIDs:         3.40 cm LV PW:         1.00 cm LV IVS:        1.20 cm LVOT diam:     2.00 cm LV SV:         48 LV SV Index:   19 LVOT Area:     3.14 cm  RIGHT VENTRICLE TAPSE (M-mode): 1.7 cm LEFT ATRIUM             Index       RIGHT ATRIUM           Index LA diam:        3.50 cm 1.40 cm/m  RA Area:     14.30 cm LA Vol (A2C):   43.4 ml 17.32 ml/m RA Volume:   32.20 ml  12.85 ml/m LA Vol (A4C):   58.8 ml 23.46 ml/m LA Biplane Vol: 52.2 ml 20.83 ml/m  AORTIC VALVE LVOT Vmax:   83.13 cm/s LVOT Vmean:  59.433 cm/s LVOT VTI:    0.154 m  AORTA Ao Root diam: 3.30 cm Ao Asc diam:  3.30 cm  SHUNTS Systemic VTI:  0.15 m Systemic Diam: 2.00 cm Rachelle Hora Croitoru MD Electronically signed by Thurmon Fair MD Signature Date/Time: 12/01/2020/4:02:10 PM    Final

## 2020-12-02 NOTE — Plan of Care (Signed)
  Problem: Education: Goal: Knowledge of risk factors and measures for prevention of condition will improve Outcome: Progressing   Problem: Coping: Goal: Psychosocial and spiritual needs will be supported Outcome: Progressing   Problem: Respiratory: Goal: Will maintain a patent airway Outcome: Progressing Goal: Complications related to the disease process, condition or treatment will be avoided or minimized Outcome: Progressing   Problem: Education: Goal: Knowledge of General Education information will improve Description: Including pain rating scale, medication(s)/side effects and non-pharmacologic comfort measures Outcome: Progressing   Problem: Clinical Measurements: Goal: Ability to maintain clinical measurements within normal limits will improve Outcome: Progressing Goal: Will remain free from infection Outcome: Progressing Goal: Diagnostic test results will improve Outcome: Progressing Goal: Respiratory complications will improve Outcome: Progressing Goal: Cardiovascular complication will be avoided Outcome: Progressing   Problem: Coping: Goal: Level of anxiety will decrease Outcome: Progressing   Problem: Elimination: Goal: Will not experience complications related to bowel motility Outcome: Progressing Goal: Will not experience complications related to urinary retention Outcome: Progressing   Problem: Pain Managment: Goal: General experience of comfort will improve Outcome: Progressing   Problem: Safety: Goal: Ability to remain free from injury will improve Outcome: Progressing   Problem: Skin Integrity: Goal: Risk for impaired skin integrity will decrease Outcome: Progressing   

## 2020-12-02 NOTE — TOC Benefit Eligibility Note (Signed)
Transition of Care [facility]) Benefit Eligibility Note    Patient Details  Name: Alejandro Bowen MRN: [id number] Date of Birth: Jul 08, 1961   Medication/Dose: Xarelto 15mg . bid for 21 days,Xaralto 20mg . daily, Eliquis 5mg .bid 30 days  Covered?: Yes  Tier: 3 Drug  Prescription Coverage [facility]erred Pharmacy: [facility]  Spoke with Person/Company/Phone Number:: . [employer] PH# [phone]  Co-Pay: Xaralto 15mg . bid $485.90 an20mg  daily $375.26, Eliqui5mg .$399..70  Prior Approval: No  Deductible: Unmet       [phone] Phone Number: 12/02/2020, 9:52 AM

## 2020-12-03 LAB — C-REACTIVE PROTEIN: CRP: 0.8 mg/dL (ref <1.0)

## 2020-12-03 LAB — COMPREHENSIVE METABOLIC PANEL
ALT: 110 U/L — ABNORMAL HIGH (ref 0–44)
AST: 56 U/L — ABNORMAL HIGH (ref 15–41)
Albumin: 2.9 g/dL — ABNORMAL LOW (ref 3.5–5.0)
Alkaline Phosphatase: 67 U/L (ref 38–126)
Anion gap: 11 (ref 5–15)
BUN: 32 mg/dL — ABNORMAL HIGH (ref 6–20)
CO2: 22 mmol/L (ref 22–32)
Calcium: 9.2 mg/dL (ref 8.9–10.3)
Chloride: 104 mmol/L (ref 98–111)
Creatinine, Ser: 1.14 mg/dL (ref 0.61–1.24)
GFR, Estimated: >60 mL/min (ref >60)
Glucose, Bld: 250 mg/dL — ABNORMAL HIGH (ref 70–99)
Potassium: 5.4 mmol/L — ABNORMAL HIGH (ref 3.5–5.1)
Sodium: 137 mmol/L (ref 135–145)
Total Bilirubin: 1.2 mg/dL (ref 0.3–1.2)
Total Protein: 5.8 g/dL — ABNORMAL LOW (ref 6.5–8.1)

## 2020-12-03 LAB — CBC WITH DIFFERENTIAL/PLATELET
Abs Immature Granulocytes: 0.89 10*3/uL — ABNORMAL HIGH (ref 0.00–0.07)
Basophils Absolute: 0.1 10*3/uL (ref 0.0–0.1)
Basophils Relative: 1 %
Eosinophils Absolute: 0 10*3/uL (ref 0.0–0.5)
Eosinophils Relative: 0 %
HCT: 45.7 % (ref 39.0–52.0)
Hemoglobin: 15.5 g/dL (ref 13.0–17.0)
Immature Granulocytes: 5 %
Lymphocytes Relative: 4 %
Lymphs Abs: 0.7 10*3/uL (ref 0.7–4.0)
MCH: 30.3 pg (ref 26.0–34.0)
MCHC: 33.9 g/dL (ref 30.0–36.0)
MCV: 89.3 fL (ref 80.0–100.0)
Monocytes Absolute: 1 10*3/uL (ref 0.1–1.0)
Monocytes Relative: 6 %
Neutro Abs: 14.7 10*3/uL — ABNORMAL HIGH (ref 1.7–7.7)
Neutrophils Relative %: 84 %
Platelets: 345 10*3/uL (ref 150–400)
RBC: 5.12 MIL/uL (ref 4.22–5.81)
RDW: 12.9 % (ref 11.5–15.5)
WBC: 17.5 10*3/uL — ABNORMAL HIGH (ref 4.0–10.5)
nRBC: 0.3 % — ABNORMAL HIGH (ref 0.0–0.2)

## 2020-12-03 LAB — CULTURE, BLOOD (ROUTINE X 2)
Culture: NO GROWTH
Culture: NO GROWTH
Special Requests: ADEQUATE

## 2020-12-03 LAB — D-DIMER, QUANTITATIVE: D-Dimer, Quant: 0.51 ug/mL-FEU — ABNORMAL HIGH (ref 0.00–0.50)

## 2020-12-03 LAB — GLUCOSE, CAPILLARY
Glucose-Capillary: 221 mg/dL — ABNORMAL HIGH (ref 70–99)
Glucose-Capillary: 250 mg/dL — ABNORMAL HIGH (ref 70–99)
Glucose-Capillary: 283 mg/dL — ABNORMAL HIGH (ref 70–99)
Glucose-Capillary: 297 mg/dL — ABNORMAL HIGH (ref 70–99)

## 2020-12-03 MED ORDER — POLYETHYLENE GLYCOL 3350 17 G PO PACK
17.0000 g | PACK | Freq: Every day | ORAL | Status: DC
  Administered 2020-12-03 – 2020-12-06 (×4): 17 g via ORAL
  Filled 2020-12-03 (×6): qty 1

## 2020-12-03 MED ORDER — METHYLPREDNISOLONE SODIUM SUCC 125 MG IJ SOLR
80.0000 mg | Freq: Two times a day (BID) | INTRAMUSCULAR | Status: DC
  Administered 2020-12-04: 80 mg via INTRAVENOUS
  Filled 2020-12-03: qty 2

## 2020-12-03 MED ORDER — BISACODYL 10 MG RE SUPP: 10.0000 mg | Freq: Every day | RECTAL | Status: DC | PRN

## 2020-12-03 MED ORDER — SODIUM ZIRCONIUM CYCLOSILICATE 10 G PO PACK
10.0000 g | PACK | Freq: Two times a day (BID) | ORAL | Status: DC
  Administered 2020-12-03 – 2020-12-04 (×3): 10 g via ORAL
  Filled 2020-12-03 (×3): qty 1

## 2020-12-03 NOTE — Progress Notes (Signed)
PROGRESS NOTE                                                                                                                                                                                                             Patient Demographics:    Alejandro Bowen, is a 59 y.o. male, DOB - 1961/02/15, GYI:[id number]  Outpatient Primary MD for the patient is [name], MD   Admit date - 11/27/2020   LOS - 5  Chief Complaint  Patient presents with  . Covid Positive  . Chest Pain       Brief Narrative: Patient is a 59 y.o. male with PMHx of HTN, chronic anxiety, obesity-who tested positive for COVID-19 on 11/29-admitted for acute hypoxic respiratory failure due to COVID-19 pneumonia-further hospital course complicated by atrial fibrillation with RVR.  See below for further details.  COVID-19 vaccinated status: Unvaccinated  Significant Events: 11/29>> COVID-19 positive (results available in care everywhere) 12/4>> Admit to [facility] for hypoxia due to COVID-19  Significant studies: 12/3>> chest x-ray: Multifocal pneumonia 12/4>> CTA chest: Bilateral multifocal pneumonia, no PE. 12/7>> Echo: EF 60-65%  COVID-19 medications: Steroids: 12/3>> Remdesivir: 12/3>> 12/8 Baricitinib: 12/4>>  Antibiotics: Rocephin: 12/4>> 12/8 Zithromax: 12/4>> 12/8  Microbiology data: 12/4 >>blood culture: No growth  Procedures: None  Consults: None  DVT prophylaxis: apixaban (ELIQUIS) tablet 5 mg     Subjective:   Remains essentially unchanged on 6-8 L of oxygen this morning.  Comfortably sitting at bedside chair-without any major issues.   Assessment  & Plan :   Acute Hypoxic Resp Failure due to Covid 19 Viral pneumonia: Remains unchanged-requiring around 6-8 L of oxygen-seems to have plateaued for the past several days.  No signs of volume overload-continue steroids and baricitinib.  Follow closely-continue attempts to titrate  down FiO2.    Fever: afebrile O2 requirements:  SpO2: 96 % O2 Flow Rate (L/min): 8 L/min   COVID-19 Labs: Recent Labs    12/01/20 0057 12/02/20 0147 12/03/20 0125  DDIMER 0.63* 0.66* 0.51*  CRP 1.1* 0.6 0.8       Component Value Date/Time   BNP 195.9 (H) 12/01/2020 0057    Recent Labs  Lab 11/28/20 0240 11/29/20 0349  PROCALCITON 0.43 0.34    No results found for: SARSCOV2NAA    Prone/Incentive Spirometry: encouraged patient to lie prone for 3-4  hours at a time for a total of 16 hours a day, and to encourage incentive spirometry use 3-4/hour  Transaminitis: Mild-stable for close follow-up for now.  Etiology likely due to COVID-19 related inflammation.  A. fib with RVR: Rate controlled with Coreg-anticoagulated with Eliquis.  Needs outpatient follow-up with cardiology-for consideration for cardioversion if he remains in atrial fibrillation.  Hyperkalemia: Mild-stop lisinopril-start Lokelma-repeat electrolytes tomorrow.  HTN: BP controlled-on the soft side this morning-continue Coreg-hold lisinopril for now.  Follow and adjust.    DM-2 (A1c 8.3 on 12/6) with uncontrolled hyperglycemia due to steroid use: CBGs remain on the higher side-steroids being tapered down-suspect it will improve over the next day or so.  Continue Lantus 25 units daily, NovoLog 6 units with meals, and resistant SSI-follow and adjust.   Recent Labs    12/02/20 2051 12/03/20 0738 12/03/20 1200  GLUCAP 268* 221* 250*    Depression/anxiety: Appears stable-continue Celexa.  GI prophylaxis: PPI  ABG: No results found for: PHART, PCO2ART, PO2ART, HCO3, TCO2, ACIDBASEDEF, O2SAT  Vent Settings: N/A  Condition - Extremely Guarded  Family Communication  :  Spouse [name] [phone]) updated over the phone on 12/9  Code Status :  Full Code  Diet :  Diet Order            Diet heart healthy/carb modified Room service appropriate? Yes; Fluid consistency: Thin  Diet effective now                   Disposition Plan  :   Status is: Inpatient  Remains inpatient appropriate because:Inpatient level of care appropriate due to severity of illness   Dispo:  Patient From: Home  Planned Disposition: Home  Expected discharge date: 12/05/2020  Medically stable for discharge: No  Barriers to discharge: Hypoxia requiring O2 supplementation/complete 5 days of IV Remdesivir  Antimicorbials  :    Anti-infectives (From admission, onward)   Start     Dose/Rate Route Frequency Ordered Stop   11/29/20 1000  remdesivir 100 mg in sodium chloride 0.9 % 100 mL IVPB       "Followed by" Linked Group Details   100 mg 200 mL/hr over 30 Minutes Intravenous Daily 11/28/20 0155 12/02/20 0915   11/28/20 1100  cefTRIAXone (ROCEPHIN) 2 g in sodium chloride 0.9 % 100 mL IVPB        2 g 200 mL/hr over 30 Minutes Intravenous Every 24 hours 11/28/20 1059 12/02/20 1220   11/28/20 0830  azithromycin (ZITHROMAX) 500 mg in sodium chloride 0.9 % 250 mL IVPB        500 mg 250 mL/hr over 60 Minutes Intravenous Daily 11/28/20 0808 12/02/20 1115   11/28/20 0830  cefTRIAXone (ROCEPHIN) 2 g in sodium chloride 0.9 % 100 mL IVPB  Status:  Discontinued        2 g 200 mL/hr over 30 Minutes Intravenous Daily 11/28/20 0808 11/28/20 0811   11/28/20 0300  remdesivir 200 mg in sodium chloride 0.9% 250 mL IVPB       "Followed by" Linked Group Details   200 mg 580 mL/hr over 30 Minutes Intravenous Once 11/28/20 0155 11/28/20 0415      Inpatient Medications  Scheduled Meds: . apixaban  5 mg Oral BID  . vitamin C  500 mg Oral Daily  . baricitinib  4 mg Oral Daily  . carvedilol  6.25 mg Oral BID WC  . cholecalciferol  1,000 Units Oral Daily  . citalopram  20 mg Oral Daily  . fentaNYL (  SUBLIMAZE) injection  50 mcg Intravenous Once  . insulin aspart  0-20 Units Subcutaneous TID WC  . insulin aspart  0-5 Units Subcutaneous QHS  . insulin aspart  6 Units Subcutaneous TID WC  . insulin detemir  35 Units  Subcutaneous Daily  . Ipratropium-Albuterol  1 puff Inhalation Q6H  . linagliptin  5 mg Oral Daily  . lisinopril  5 mg Oral Daily  . methylPREDNISolone (SOLU-MEDROL) injection  80 mg Intravenous Q8H  . multivitamin with minerals  1 tablet Oral Daily  . pantoprazole  40 mg Oral Daily  . polyethylene glycol  17 g Oral Daily  . sodium zirconium cyclosilicate  10 g Oral BID  . zinc sulfate  220 mg Oral Daily   Continuous Infusions: PRN Meds:.bisacodyl, guaiFENesin-dextromethorphan   Time Spent in minutes  25  See all Orders from today for further details   Jeoffrey Massed M.D on 12/03/2020 at 2:45 PM  To page go to [email or web address] - use universal password  [facility] -  Office  [phone]    Objective:   Vitals:   12/03/20 0420 12/03/20 0739 12/03/20 0948 12/03/20 1030  BP: (!) 101/54 95/81 124/68 130/85  Pulse: 83 87 100 90  Resp: 18 18 (!) 21 17  Temp: 97.7 F (36.5 C) 97.7 F (36.5 C)  98.1 F (36.7 C)  TempSrc: Oral Oral  Oral  SpO2: 96% 91% 90% 96%  Weight: 103.4 kg     Height:        Wt Readings from Last 3 Encounters:  12/03/20 103.4 kg  11/14/16 122.5 kg     Intake/Output Summary (Last 24 hours) at 12/03/2020 1445 Last data filed at 12/03/2020 1300 Gross per 24 hour  Intake 480 ml  Output --  Net 480 ml     Physical Exam Gen Exam:Alert awake-not in any distress HEENT:atraumatic, normocephalic Chest: B/L clear to auscultation anteriorly CVS:S1S2 regular Abdomen:soft non tender, non distended Extremities:no edema Neurology: Non focal Skin: no rash   Data Review:    CBC Recent Labs  Lab 11/29/20 0349 11/30/20 0353 12/01/20 0057 12/02/20 0147 12/03/20 0125  WBC 7.6 11.7* 11.9* 13.5* 17.5*  HGB 13.9 13.8 14.2 14.9 15.5  HCT 42.2 40.3 40.4 42.7 45.7  PLT 173 232 279 296 345  MCV 92.3 91.0 88.6 88.2 89.3  MCH 30.4 31.2 31.1 30.8 30.3  MCHC 32.9 34.2 35.1 34.9 33.9  RDW 13.2 13.1 13.1 13.2 12.9  LYMPHSABS 0.7 0.6* 0.5* 0.6* 0.7   MONOABS 0.5 0.8 1.0 0.9 1.0  EOSABS 0.0 0.0 0.0 0.0 0.0  BASOSABS 0.0 0.0 0.1 0.1 0.1    Chemistries  Recent Labs  Lab 11/29/20 0349 11/30/20 0353 12/01/20 0057 12/02/20 0147 12/03/20 0125  NA 138 140 142 140 137  K 3.9 4.3 4.1 4.3 5.4*  CL 104 105 107 107 104  CO2 GLUCOSE 268* 274* 227* 218* 250*  BUN 20 30* 32* 38* 32*  CREATININE 1.00 1.07 0.92 1.05 1.14  CALCIUM 8.8* 9.1 9.0 9.1 9.2  MG 2.1 2.5*  --   --   --   AST 58* 53* 76* 66* 56*  ALT 70* 68* 98* 118* 110*  ALKPHOS 60 56 61 69 67  BILITOT 0.6 0.3 0.7 0.7 1.2   ------------------------------------------------------------------------------------------------------------------ No results for input(s): CHOL, HDL, LDLCALC, TRIG, CHOLHDL, LDLDIRECT in the last 72 hours.  Lab Results  Component Value Date   HGBA1C 8.3 (H) 11/30/2020   ------------------------------------------------------------------------------------------------------------------ Recent Labs  12/01/20 0057  TSH 0.965   ------------------------------------------------------------------------------------------------------------------ No results for input(s): VITAMINB12, FOLATE, FERRITIN, TIBC, IRON, RETICCTPCT in the last 72 hours.  Coagulation profile No results for input(s): INR, PROTIME in the last 168 hours.  Recent Labs    12/02/20 0147 12/03/20 0125  DDIMER 0.66* 0.51*    Cardiac Enzymes No results for input(s): CKMB, TROPONINI, MYOGLOBIN in the last 168 hours.  Invalid input(s): CK ------------------------------------------------------------------------------------------------------------------    Component Value Date/Time   BNP 195.9 (H) 12/01/2020 0057    Micro Results Recent Results (from the past 240 hour(s))  Blood Culture (routine x 2)     Status: None   Collection Time: 11/28/20  2:40 AM   Specimen: BLOOD  Result Value Ref Range Status   Specimen Description BLOOD LEFT ANTECUBITAL  Final   Special  Requests   Final    BOTTLES DRAWN AEROBIC AND ANAEROBIC Blood Culture adequate volume   Culture   Final    NO GROWTH 5 DAYS Performed at [facility], [address] [place], [place] [address]    Report Status 12/03/2020 FINAL  Final  Blood Culture (routine x 2)     Status: None   Collection Time: 11/28/20  2:40 AM   Specimen: BLOOD LEFT HAND  Result Value Ref Range Status   Specimen Description BLOOD LEFT HAND  Final   Special Requests   Final    BOTTLES DRAWN AEROBIC AND ANAEROBIC Blood Culture results may not be optimal due to an inadequate volume of blood received in culture bottles   Culture   Final    NO GROWTH 5 DAYS Performed at [facility], [address], [place], [place] [address]    Report Status 12/03/2020 FINAL  Final    Radiology Reports CT Angio Chest PE W/Cm &/Or Wo Cm  Result Date: 11/28/2020 CLINICAL DATA:  Chest pain. EXAM: CT ANGIOGRAPHY CHEST WITH CONTRAST TECHNIQUE: Multidetector CT imaging of the chest was performed using the standard protocol during bolus administration of intravenous contrast. Multiplanar CT image reconstructions and MIPs were obtained to evaluate the vascular anatomy. CONTRAST:  75mL OMNIPAQUE IOHEXOL 350 MG/ML SOLN COMPARISON:  None. FINDINGS: Cardiovascular: Some of the most peripheral segmental and subsegmental pulmonary artery branches cannot be definitively characterize due to mild patient breathing motion artifact, however, there is no pulmonary embolism identified within the main, lobar or central segmental pulmonary arteries bilaterally. No pericardial effusion. No thoracic aortic aneurysm or evidence of aortic dissection. Mediastinum/Nodes: No mass or enlarged lymph nodes are seen within the mediastinum. Esophagus is unremarkable. Trachea is unremarkable. Lungs/Pleura: Patchy ground-glass airspace opacities are seen throughout both lungs, RIGHT slightly greater than LEFT, consistent with multifocal pneumonia. No pleural  effusion or pneumothorax. Upper Abdomen: Liver is low in density suggesting fatty infiltration, incompletely imaged. Limited images of the upper abdomen are otherwise unremarkable. Musculoskeletal: No acute or suspicious osseous finding. Mild degenerative spurring within the thoracic spine. Review of the MIP images confirms the above findings. IMPRESSION: 1. Bilateral multifocal pneumonia. 2. No pulmonary embolism seen. 3. Fatty infiltration of the liver. Electronically Signed   By: Bary Richard M.D.   On: 11/28/2020 07:56   DG Chest Portable 1 View  Result Date: 11/27/2020 CLINICAL DATA:  COVID-19 positive, chest pain EXAM: PORTABLE CHEST 1 VIEW COMPARISON:  Radiograph 11/14/2016 FINDINGS: Heterogeneous mixed consolidative and interstitial opacities present the mid to lower lungs in a slight peripheral predominance compatible reported imaging features of COVID 19 pneumonia. No pneumothorax. No effusion. The  cardiomediastinal contours are unremarkable for portable technique. No acute osseous or soft tissue abnormality. IMPRESSION: Appearance compatible with a multifocal pneumonia in the setting of COVID 19. Electronically Signed   By: Kreg ShropshirePrice  DeHay M.D.   On: 11/27/2020 18:26   ECHOCARDIOGRAM COMPLETE  Result Date: 12/01/2020    ECHOCARDIOGRAM REPORT   Patient Name:   Donah DriverRANDY Chojnowski Date of Exam: 12/01/2020 Medical Rec #:  [id number]    Height:       78.0 in Accession #:    [id number]   Weight:       255.7 lb Date of Birth:  Nov 04, 1961   BSA:          2.506 m Patient Age:    58 years     BP:           123/88 mmHg Patient Gender: M            HR:           80 bpm. Exam Location:  Inpatient Procedure: 2D Echo, Cardiac Doppler and Color Doppler Indications:    Atrial Fibrillation 427.31 / I48.91  History:        Patient has no prior history of Echocardiogram examinations.                 Risk Factors:Hypertension.  Sonographer:    Eulah PontSarah Pirrotta RDCS Referring Phys: [id number] Deno LungerGEORGE J SHALHOUB IMPRESSIONS  1. Left  ventricular ejection fraction, by estimation, is 60 to 65%. The left ventricle has normal function. The left ventricle has no regional wall motion abnormalities. Left ventricular diastolic function could not be evaluated.  2. Right ventricular systolic function is normal. The right ventricular size is normal.  3. The mitral valve is normal in structure. No evidence of mitral valve regurgitation. No evidence of mitral stenosis.  4. The aortic valve is normal in structure. Aortic valve regurgitation is not visualized. No aortic stenosis is present.  5. The inferior vena cava is normal in size with greater than 50% respiratory variability, suggesting right atrial pressure of 3 mmHg. FINDINGS  Left Ventricle: Left ventricular ejection fraction, by estimation, is 60 to 65%. The left ventricle has normal function. The left ventricle has no regional wall motion abnormalities. The left ventricular internal cavity size was normal in size. There is  no left ventricular hypertrophy. Left ventricular diastolic function could not be evaluated due to atrial fibrillation. Left ventricular diastolic function could not be evaluated. Right Ventricle: The right ventricular size is normal. No increase in right ventricular wall thickness. Right ventricular systolic function is normal. Left Atrium: Left atrial size was normal in size. Right Atrium: Right atrial size was normal in size. Pericardium: There is no evidence of pericardial effusion. Mitral Valve: The mitral valve is normal in structure. No evidence of mitral valve regurgitation. No evidence of mitral valve stenosis. Tricuspid Valve: The tricuspid valve is normal in structure. Tricuspid valve regurgitation is not demonstrated. No evidence of tricuspid stenosis. Aortic Valve: The aortic valve is normal in structure. Aortic valve regurgitation is not visualized. No aortic stenosis is present. Pulmonic Valve: The pulmonic valve was normal in structure. Pulmonic valve regurgitation  is not visualized. No evidence of pulmonic stenosis. Aorta: The aortic root is normal in size and structure. Venous: The inferior vena cava is normal in size with greater than 50% respiratory variability, suggesting right atrial pressure of 3 mmHg. IAS/Shunts: No atrial level shunt detected by color flow Doppler.  LEFT VENTRICLE PLAX 2D LVIDd:  4.90 cm LVIDs:         3.40 cm LV PW:         1.00 cm LV IVS:        1.20 cm LVOT diam:     2.00 cm LV SV:         48 LV SV Index:   19 LVOT Area:     3.14 cm  RIGHT VENTRICLE TAPSE (M-mode): 1.7 cm LEFT ATRIUM             Index       RIGHT ATRIUM           Index LA diam:        3.50 cm 1.40 cm/m  RA Area:     14.30 cm LA Vol (A2C):   43.4 ml 17.32 ml/m RA Volume:   32.20 ml  12.85 ml/m LA Vol (A4C):   58.8 ml 23.46 ml/m LA Biplane Vol: 52.2 ml 20.83 ml/m  AORTIC VALVE LVOT Vmax:   83.13 cm/s LVOT Vmean:  59.433 cm/s LVOT VTI:    0.154 m  AORTA Ao Root diam: 3.30 cm Ao Asc diam:  3.30 cm  SHUNTS Systemic VTI:  0.15 m Systemic Diam: 2.00 cm Rachelle Hora Croitoru MD Electronically signed by Thurmon Fair MD Signature Date/Time: 12/01/2020/4:02:10 PM    Final

## 2020-12-03 NOTE — Progress Notes (Signed)
Inpatient Diabetes Program Recommendations  AACE/ADA: New Consensus Statement on Inpatient Glycemic Control (2015)  Target Ranges:  Prepandial:   less than 140 mg/dL      Peak postprandial:   less than 180 mg/dL (1-2 hours)      Critically ill patients:  140 - 180 mg/dL   Lab Results  Component Value Date   GLUCAP 221 (H) 12/03/2020   HGBA1C 8.3 (H) 11/30/2020    Review of Glycemic Control Results for Alejandro Bowen, Alejandro Bowen (MRN [id number]) as of 12/03/2020 10:35  Ref. Range 12/02/2020 16:21 12/02/2020 20:51 12/03/2020 07:38  Glucose-Capillary Latest Ref Range: 70 - 99 mg/dL 476 (H) 546 (H) 503 (H)   Diabetes history: Type 2 DM Outpatient Diabetes medications: none Current orders for Inpatient glycemic control: Levemir 35 units QD, Novolog 6 units TID, Novolog 0-20 units TID, Novolog 0-5 units QHS, Tradjenta 5 mg QD Solumedrol 80 mg Q8H  Inpatient Diabetes Program Recommendations:   Consider increasing Levemir to 42 units QD and meal coverage to Novolog 8 units TID (assuming patient is consuming >50% of meal).   Thanks, Lujean Rave, MSN, RNC-OB Diabetes Coordinator [phone] (8a-5p)

## 2020-12-04 LAB — CBC
HCT: 44.5 % (ref 39.0–52.0)
Hemoglobin: 15.1 g/dL (ref 13.0–17.0)
MCH: 29.9 pg (ref 26.0–34.0)
MCHC: 33.9 g/dL (ref 30.0–36.0)
MCV: 88.1 fL (ref 80.0–100.0)
Platelets: 380 10*3/uL (ref 150–400)
RBC: 5.05 MIL/uL (ref 4.22–5.81)
RDW: 13.1 % (ref 11.5–15.5)
WBC: 20.6 10*3/uL — ABNORMAL HIGH (ref 4.0–10.5)
nRBC: 0.3 % — ABNORMAL HIGH (ref 0.0–0.2)

## 2020-12-04 LAB — COMPREHENSIVE METABOLIC PANEL
ALT: 96 U/L — ABNORMAL HIGH (ref 0–44)
AST: 38 U/L (ref 15–41)
Albumin: 2.8 g/dL — ABNORMAL LOW (ref 3.5–5.0)
Alkaline Phosphatase: 64 U/L (ref 38–126)
Anion gap: 11 (ref 5–15)
BUN: 34 mg/dL — ABNORMAL HIGH (ref 6–20)
CO2: 22 mmol/L (ref 22–32)
Calcium: 9 mg/dL (ref 8.9–10.3)
Chloride: 105 mmol/L (ref 98–111)
Creatinine, Ser: 1 mg/dL (ref 0.61–1.24)
GFR, Estimated: >60 mL/min (ref >60)
Glucose, Bld: 184 mg/dL — ABNORMAL HIGH (ref 70–99)
Potassium: 4.2 mmol/L (ref 3.5–5.1)
Sodium: 138 mmol/L (ref 135–145)
Total Bilirubin: 0.8 mg/dL (ref 0.3–1.2)
Total Protein: 5.9 g/dL — ABNORMAL LOW (ref 6.5–8.1)

## 2020-12-04 LAB — C-REACTIVE PROTEIN: CRP: 0.6 mg/dL (ref <1.0)

## 2020-12-04 LAB — GLUCOSE, CAPILLARY
Glucose-Capillary: 182 mg/dL — ABNORMAL HIGH (ref 70–99)
Glucose-Capillary: 256 mg/dL — ABNORMAL HIGH (ref 70–99)
Glucose-Capillary: 255 mg/dL — ABNORMAL HIGH (ref 70–99)
Glucose-Capillary: 281 mg/dL — ABNORMAL HIGH (ref 70–99)
Glucose-Capillary: 284 mg/dL — ABNORMAL HIGH (ref 70–99)

## 2020-12-04 LAB — D-DIMER, QUANTITATIVE: D-Dimer, Quant: 0.62 ug/mL-FEU — ABNORMAL HIGH (ref 0.00–0.50)

## 2020-12-04 MED ORDER — SALINE SPRAY 0.65 % NA SOLN
1.0000 | NASAL | Status: DC | PRN
  Administered 2020-12-04: 1 via NASAL
  Filled 2020-12-04: qty 44

## 2020-12-04 MED ORDER — METHYLPREDNISOLONE SODIUM SUCC 125 MG IJ SOLR
60.0000 mg | Freq: Two times a day (BID) | INTRAMUSCULAR | Status: DC
  Administered 2020-12-04 – 2020-12-07 (×7): 60 mg via INTRAVENOUS
  Filled 2020-12-04 (×7): qty 2

## 2020-12-04 NOTE — Progress Notes (Signed)
Physical Therapy Treatment Patient Details Name: Alejandro Bowen MRN: [id number] DOB: 09-Jan-1961 Today's Date: 12/04/2020    History of Present Illness 59yo male c/o SOB, chest pain, and cough all progressive in nature, hypoxic in the ED. PE negative, found to be Covid positive. PMH anxiety    PT Comments    Pt progressing well with mobility, limited by cardiopulmonary status. Supervision transfers and ambulation 55' without AD. Mobility limited to in room due to high O2 needs. Pt on 7L O2. At rest SpO2 92%, HR 113. During activity, desat to 77% but mainly in the 80s. Max HR 138. 3-4 minute recovery time after activity to return SpO2 to 90%. Despite low SpO2, pt reports minimal SOB. Able to hold conversation with short sentences. Pt performed LE exercises seated in recliner. Pt is very motivated and cooperative.     Follow Up Recommendations  No PT follow up     Equipment Recommendations  None recommended by PT    Recommendations for Other Services       Precautions / Restrictions Precautions Precautions: Other (comment) Precaution Comments: watch sats/HR, Covid +    Mobility  Bed Mobility Overal bed mobility: Independent                Transfers Overall transfer level: Needs assistance Equipment used: None Transfers: Sit to/from Stand;Stand Pivot Transfers Sit to Stand: Supervision Stand pivot transfers: Supervision       General transfer comment: supervision for safety/line management  Ambulation/Gait Ambulation/Gait assistance: Supervision Gait Distance (Feet): 80 Feet Assistive device: None Gait Pattern/deviations: WFL(Within Functional Limits) Gait velocity: WFL   General Gait Details: Amb limited to in room due to high O2 needs. Steady gait noted. Desat to 77% but sats primarily in the 80s during mobility. Max HR 138. Pt reports only mild SOB. 3-4 minute recovery time to return SpO2 to 90%.   Stairs             Wheelchair Mobility     Modified Rankin (Stroke Patients Only)       Balance Overall balance assessment: Independent                                          Cognition Arousal/Alertness: Awake/alert Behavior During Therapy: WFL for tasks assessed/performed Overall Cognitive Status: Within Functional Limits for tasks assessed                                 General Comments: extremely pleasant and cooperative      Exercises General Exercises - Lower Extremity Ankle Circles/Pumps: AROM;Both;Seated;20 reps Long Arc Quad: AROM;Right;Left;10 reps;Seated Hip Flexion/Marching: AROM;Right;Left;15 reps;Seated    General Comments General comments (skin integrity, edema, etc.): Pt on 7L continuous O2. At rest, SpO2 92%, HR 113.      Pertinent Vitals/Pain Pain Assessment: Faces Faces Pain Scale: Hurts a little bit Pain Location: chest during mobility Pain Descriptors / Indicators: Tightness Pain Intervention(s): Monitored during session;Repositioned;Limited activity within patient's tolerance    Home Living                      Prior Function            PT Goals (current goals can now be found in the care plan section) Acute Rehab PT Goals Patient Stated Goal: home Progress towards PT  goals: Progressing toward goals    Frequency    Min 3X/week      PT Plan Current plan remains appropriate    Co-evaluation              AM-PAC PT "6 Clicks" Mobility   Outcome Measure  Help needed turning from your back to your side while in a flat bed without using bedrails?: None Help needed moving from lying on your back to sitting on the side of a flat bed without using bedrails?: None Help needed moving to and from a bed to a chair (including a wheelchair)?: A Little Help needed standing up from a chair using your arms (e.g., wheelchair or bedside chair)?: A Little Help needed to walk in hospital room?: A Little Help needed climbing 3-5 steps with a  railing? : A Little 6 Click Score: 20    End of Session Equipment Utilized During Treatment: Oxygen Activity Tolerance: Patient tolerated treatment well Patient left: in chair;with call bell/phone within reach Nurse Communication: Mobility status PT Visit Diagnosis: Difficulty in walking, not elsewhere classified (R26.2)     Time: 2094-7096 PT Time Calculation (min) (ACUTE ONLY): 17 min  Charges:  $Gait Training: 8-22 mins                     [name], PT  Office # [phone] Pager [phone]    [name] 12/04/2020, 11:01 AM

## 2020-12-04 NOTE — Progress Notes (Signed)
PROGRESS NOTE                                                                                                                                                                                                             Patient Demographics:    Alejandro Bowen, is a 59 y.o. male, DOB - [birth year], UJW:[id number]  Outpatient Primary MD for the patient is Alejandro Banner, MD   Admit date - 11/27/2020   LOS - 6  Chief Complaint  Patient presents with  . Covid Positive  . Chest Pain       Brief Narrative: Patient is a 59 y.o. male with PMHx of HTN, chronic anxiety, obesity-who tested positive for COVID-19 on 11/29-admitted for acute hypoxic respiratory failure due to COVID-19 pneumonia-further hospital course complicated by atrial fibrillation with RVR.  See below for further details.  COVID-19 vaccinated status: Unvaccinated  Significant Events: 11/29>> COVID-19 positive (results available in care everywhere) 12/4>> Admit to [facility] for hypoxia due to COVID-19  Significant studies: 12/3>> chest x-ray: Multifocal pneumonia 12/4>> CTA chest: Bilateral multifocal pneumonia, no PE. 12/7>> Echo: EF 60-65%  COVID-19 medications: Steroids: 12/3>> Remdesivir: 12/3>> 12/8 Baricitinib: 12/4>>  Antibiotics: Rocephin: 12/4>> 12/8 Zithromax: 12/4>> 12/8  Microbiology data: 12/4 >>blood culture: No growth  Procedures: None  Consults: None  DVT prophylaxis: apixaban (ELIQUIS) tablet 5 mg     Subjective:   No major issues-stable on 7-8 L of oxygen.  Inquiring as to when he could go home   Assessment  & Plan :   Acute Hypoxic Resp Failure due to Covid 19 Viral pneumonia: Remains unchanged-requiring around 7-8 L of oxygen-no evidence of volume overload-remains on tapering steroids and baricitinib.  Continued attempts to titrate down FiO2-follow closely.   Fever: afebrile O2 requirements:  SpO2: 93 % O2 Flow Rate  (L/min): 8 L/min   COVID-19 Labs: Recent Labs    12/02/20 0147 12/03/20 0125 12/04/20 0130  DDIMER 0.66* 0.51* 0.62*  CRP 0.6 0.8 0.6       Component Value Date/Time   BNP 195.9 (H) 12/01/2020 0057    Recent Labs  Lab 11/28/20 0240 11/29/20 0349  PROCALCITON 0.43 0.34    No results found for: SARSCOV2NAA    Prone/Incentive Spirometry: encouraged patient to lie prone for 3-4 hours at a time for a total of 16 hours a  day, and to encourage incentive spirometry use 3-4/hour  Transaminitis: Mild-stable for close follow-up for now.  Etiology likely due to COVID-19 related inflammation.  A. fib with RVR: Rate controlled with Coreg-anticoagulated with Eliquis.  Needs outpatient follow-up with cardiology-for consideration for cardioversion if he remains in atrial fibrillation.  Hyperkalemia: Resolved-stop Lokelma-continue to hold lisinopril  HTN: BP controlled-on the soft side this morning-continue Coreg-hold lisinopril for now.  Follow and adjust.    DM-2 (A1c 8.3 on 12/6) with uncontrolled hyperglycemia due to steroid use: CBGs remain on the higher side-CBGs this morning better controlled compared to the past few days-steroid dose has been tapered down-continue Lantus 25 units, 6 units of NovoLog with meals and SSI-follow and readjust.  Suspect that as steroids continue to get tapered down-CBGs will improve further.   Recent Labs    12/03/20 1603 12/03/20 2107 12/04/20 0758  GLUCAP 283* 297* 182*    Depression/anxiety: Appears stable-continue Celexa.  GI prophylaxis: PPI  ABG: No results found for: PHART, PCO2ART, PO2ART, HCO3, TCO2, ACIDBASEDEF, O2SAT  Vent Settings: N/A  Condition - Extremely Guarded  Family Communication  :  Spouse Alejandro Bowen(Gwen [phone]) updated over the phone on 12/10  Code Status :  Full Code  Diet :  Diet Order            Diet heart healthy/carb modified Room service appropriate? Yes; Fluid consistency: Thin  Diet effective now                   Disposition Plan  :   Status is: Inpatient  Remains inpatient appropriate because:Inpatient level of care appropriate due to severity of illness   Dispo:  Patient From: Home  Planned Disposition: Home  Expected discharge date: 12/09/2020  Medically stable for discharge: No  Barriers to discharge: Hypoxia requiring O2 supplementation  Antimicorbials  :    Anti-infectives (From admission, onward)   Start     Dose/Rate Route Frequency Ordered Stop   11/29/20 1000  remdesivir 100 mg in sodium chloride 0.9 % 100 mL IVPB       "Followed by" Linked Group Details   100 mg 200 mL/hr over 30 Minutes Intravenous Daily 11/28/20 0155 12/02/20 0915   11/28/20 1100  cefTRIAXone (ROCEPHIN) 2 g in sodium chloride 0.9 % 100 mL IVPB        2 g 200 mL/hr over 30 Minutes Intravenous Every 24 hours 11/28/20 1059 12/02/20 1220   11/28/20 0830  azithromycin (ZITHROMAX) 500 mg in sodium chloride 0.9 % 250 mL IVPB        500 mg 250 mL/hr over 60 Minutes Intravenous Daily 11/28/20 0808 12/02/20 1115   11/28/20 0830  cefTRIAXone (ROCEPHIN) 2 g in sodium chloride 0.9 % 100 mL IVPB  Status:  Discontinued        2 g 200 mL/hr over 30 Minutes Intravenous Daily 11/28/20 0808 11/28/20 0811   11/28/20 0300  remdesivir 200 mg in sodium chloride 0.9% 250 mL IVPB       "Followed by" Linked Group Details   200 mg 580 mL/hr over 30 Minutes Intravenous Once 11/28/20 0155 11/28/20 0415      Inpatient Medications  Scheduled Meds: . apixaban  5 mg Oral BID  . vitamin C  500 mg Oral Daily  . baricitinib  4 mg Oral Daily  . carvedilol  6.25 mg Oral BID WC  . cholecalciferol  1,000 Units Oral Daily  . citalopram  20 mg Oral Daily  . fentaNYL (SUBLIMAZE) injection  50 mcg Intravenous Once  . insulin aspart  0-20 Units Subcutaneous TID WC  . insulin aspart  0-5 Units Subcutaneous QHS  . insulin aspart  6 Units Subcutaneous TID WC  . insulin detemir  35 Units Subcutaneous Daily  .  Ipratropium-Albuterol  1 puff Inhalation Q6H  . linagliptin  5 mg Oral Daily  . methylPREDNISolone (SOLU-MEDROL) injection  80 mg Intravenous Q12H  . multivitamin with minerals  1 tablet Oral Daily  . pantoprazole  40 mg Oral Daily  . polyethylene glycol  17 g Oral Daily  . sodium zirconium cyclosilicate  10 g Oral BID  . zinc sulfate  220 mg Oral Daily   Continuous Infusions: PRN Meds:.bisacodyl, guaiFENesin-dextromethorphan, sodium chloride   Time Spent in minutes  25  See all Orders from today for further details   Jeoffrey Massed M.D on 12/04/2020 at 11:51 AM  To page go to [email or web address] - use universal password  [facility] -  Office  [phone]    Objective:   Vitals:   12/03/20 1030 12/03/20 2105 12/04/20 0450 12/04/20 0615  BP: 130/85 119/90 135/90   Pulse: 90 96 87   Resp: 17 18 18    Temp: 98.1 F (36.7 C) 97.9 F (36.6 C) 98.2 F (36.8 C)   TempSrc: Oral Oral Oral   SpO2: 96% 92% 93%   Weight:    102.6 kg  Height:        Wt Readings from Last 3 Encounters:  12/04/20 102.6 kg  11/14/16 122.5 kg     Intake/Output Summary (Last 24 hours) at 12/04/2020 1151 Last data filed at 12/04/2020 0913 Gross per 24 hour  Intake 960 ml  Output --  Net 960 ml     Physical Exam Gen Exam:Alert awake-not in any distress HEENT:atraumatic, normocephalic Chest: B/L clear to auscultation anteriorly CVS:S1S2 regular Abdomen:soft non tender, non distended Extremities:no edema Neurology: Non focal Skin: no rash   Data Review:    CBC Recent Labs  Lab 11/29/20 0349 11/30/20 0353 12/01/20 0057 12/02/20 0147 12/03/20 0125 12/04/20 0130  WBC 7.6 11.7* 11.9* 13.5* 17.5* 20.6*  HGB 13.9 13.8 14.2 14.9 15.5 15.1  HCT 42.2 40.3 40.4 42.7 45.7 44.5  PLT 173 232 279 296 345 380  MCV 92.3 91.0 88.6 88.2 89.3 88.1  MCH 30.4 31.2 31.1 30.8 30.3 29.9  MCHC 32.9 34.2 35.1 34.9 33.9 33.9  RDW 13.2 13.1 13.1 13.2 12.9 13.1  LYMPHSABS 0.7 0.6* 0.5* 0.6* 0.7   --   MONOABS 0.5 0.8 1.0 0.9 1.0  --   EOSABS 0.0 0.0 0.0 0.0 0.0  --   BASOSABS 0.0 0.0 0.1 0.1 0.1  --     Chemistries  Recent Labs  Lab 11/29/20 0349 11/30/20 0353 12/01/20 0057 12/02/20 0147 12/03/20 0125 12/04/20 0130  NA 138 140 142 140 137 138  K 3.9 4.3 4.1 4.3 5.4* 4.2  CL 104 105 107 107 104 105  CO2 23 24 22 23 22 22   GLUCOSE 268* 274* 227* 218* 250* 184*  BUN 20 30* 32* 38* 32* 34*  CREATININE 1.00 1.07 0.92 1.05 1.14 1.00  CALCIUM 8.8* 9.1 9.0 9.1 9.2 9.0  MG 2.1 2.5*  --   --   --   --   AST 58* 53* 76* 66* 56* 38  ALT 70* 68* 98* 118* 110* 96*  ALKPHOS 60 56 61 69 67 64  BILITOT 0.6 0.3 0.7 0.7 1.2 0.8   ------------------------------------------------------------------------------------------------------------------ No results for input(s): CHOL, HDL,  LDLCALC, TRIG, CHOLHDL, LDLDIRECT in the last 72 hours.  Lab Results  Component Value Date   HGBA1C 8.3 (H) 11/30/2020   ------------------------------------------------------------------------------------------------------------------ No results for input(s): TSH, T4TOTAL, T3FREE, THYROIDAB in the last 72 hours.  Invalid input(s): FREET3 ------------------------------------------------------------------------------------------------------------------ No results for input(s): VITAMINB12, FOLATE, FERRITIN, TIBC, IRON, RETICCTPCT in the last 72 hours.  Coagulation profile No results for input(s): INR, PROTIME in the last 168 hours.  Recent Labs    12/03/20 0125 12/04/20 0130  DDIMER 0.51* 0.62*    Cardiac Enzymes No results for input(s): CKMB, TROPONINI, MYOGLOBIN in the last 168 hours.  Invalid input(s): CK ------------------------------------------------------------------------------------------------------------------    Component Value Date/Time   BNP 195.9 (H) 12/01/2020 0057    Micro Results Recent Results (from the past 240 hour(s))  Blood Culture (routine x 2)     Status: None    Collection Time: 11/28/20  2:40 AM   Specimen: BLOOD  Result Value Ref Range Status   Specimen Description BLOOD LEFT ANTECUBITAL  Final   Special Requests   Final    BOTTLES DRAWN AEROBIC AND ANAEROBIC Blood Culture adequate volume   Culture   Final    NO GROWTH 5 DAYS Performed at [facility], [address] [place], [place] [address]    Report Status 12/03/2020 FINAL  Final  Blood Culture (routine x 2)     Status: None   Collection Time: 11/28/20  2:40 AM   Specimen: BLOOD LEFT HAND  Result Value Ref Range Status   Specimen Description BLOOD LEFT HAND  Final   Special Requests   Final    BOTTLES DRAWN AEROBIC AND ANAEROBIC Blood Culture results may not be optimal due to an inadequate volume of blood received in culture bottles   Culture   Final    NO GROWTH 5 DAYS Performed at [facility], [address], [place], [place] [address]    Report Status 12/03/2020 FINAL  Final    Radiology Reports CT Angio Chest PE W/Cm &/Or Wo Cm  Result Date: 11/28/2020 CLINICAL DATA:  Chest pain. EXAM: CT ANGIOGRAPHY CHEST WITH CONTRAST TECHNIQUE: Multidetector CT imaging of the chest was performed using the standard protocol during bolus administration of intravenous contrast. Multiplanar CT image reconstructions and MIPs were obtained to evaluate the vascular anatomy. CONTRAST:  21mL OMNIPAQUE IOHEXOL 350 MG/ML SOLN COMPARISON:  None. FINDINGS: Cardiovascular: Some of the most peripheral segmental and subsegmental pulmonary artery branches cannot be definitively characterize due to mild patient breathing motion artifact, however, there is no pulmonary embolism identified within the main, lobar or central segmental pulmonary arteries bilaterally. No pericardial effusion. No thoracic aortic aneurysm or evidence of aortic dissection. Mediastinum/Nodes: No mass or enlarged lymph nodes are seen within the mediastinum. Esophagus is unremarkable. Trachea is unremarkable. Lungs/Pleura: Patchy  ground-glass airspace opacities are seen throughout both lungs, RIGHT slightly greater than LEFT, consistent with multifocal pneumonia. No pleural effusion or pneumothorax. Upper Abdomen: Liver is low in density suggesting fatty infiltration, incompletely imaged. Limited images of the upper abdomen are otherwise unremarkable. Musculoskeletal: No acute or suspicious osseous finding. Mild degenerative spurring within the thoracic spine. Review of the MIP images confirms the above findings. IMPRESSION: 1. Bilateral multifocal pneumonia. 2. No pulmonary embolism seen. 3. Fatty infiltration of the liver. Electronically Signed   By: Bary Richard M.D.   On: 11/28/2020 07:56   DG Chest Portable 1 View  Result Date: 11/27/2020 CLINICAL DATA:  COVID-19 positive, chest pain EXAM: PORTABLE CHEST 1 VIEW  COMPARISON:  Radiograph 11/14/2016 FINDINGS: Heterogeneous mixed consolidative and interstitial opacities present the mid to lower lungs in a slight peripheral predominance compatible reported imaging features of COVID 19 pneumonia. No pneumothorax. No effusion. The cardiomediastinal contours are unremarkable for portable technique. No acute osseous or soft tissue abnormality. IMPRESSION: Appearance compatible with a multifocal pneumonia in the setting of COVID 19. Electronically Signed   By: Kreg Shropshire M.D.   On: 11/27/2020 18:26   ECHOCARDIOGRAM COMPLETE  Result Date: 12/01/2020    ECHOCARDIOGRAM REPORT   Patient Name:   Alejandro Bowen Date of Exam: 12/01/2020 Medical Rec #:  [id number]    Height:       78.0 in Accession #:    [id number]   Weight:       255.7 lb Date of Birth:  [birth year]   BSA:          2.506 m Patient Age:    58 years     BP:           123/88 mmHg Patient Gender: M            HR:           80 bpm. Exam Location:  Inpatient Procedure: 2D Echo, Cardiac Doppler and Color Doppler Indications:    Atrial Fibrillation 427.31 / I48.91  History:        Patient has no prior history of Echocardiogram  examinations.                 Risk Factors:Hypertension.  Sonographer:    Eulah Pont RDCS Referring Phys: [id number] Deno Lunger SHALHOUB IMPRESSIONS  1. Left ventricular ejection fraction, by estimation, is 60 to 65%. The left ventricle has normal function. The left ventricle has no regional wall motion abnormalities. Left ventricular diastolic function could not be evaluated.  2. Right ventricular systolic function is normal. The right ventricular size is normal.  3. The mitral valve is normal in structure. No evidence of mitral valve regurgitation. No evidence of mitral stenosis.  4. The aortic valve is normal in structure. Aortic valve regurgitation is not visualized. No aortic stenosis is present.  5. The inferior vena cava is normal in size with greater than 50% respiratory variability, suggesting right atrial pressure of 3 mmHg. FINDINGS  Left Ventricle: Left ventricular ejection fraction, by estimation, is 60 to 65%. The left ventricle has normal function. The left ventricle has no regional wall motion abnormalities. The left ventricular internal cavity size was normal in size. There is  no left ventricular hypertrophy. Left ventricular diastolic function could not be evaluated due to atrial fibrillation. Left ventricular diastolic function could not be evaluated. Right Ventricle: The right ventricular size is normal. No increase in right ventricular wall thickness. Right ventricular systolic function is normal. Left Atrium: Left atrial size was normal in size. Right Atrium: Right atrial size was normal in size. Pericardium: There is no evidence of pericardial effusion. Mitral Valve: The mitral valve is normal in structure. No evidence of mitral valve regurgitation. No evidence of mitral valve stenosis. Tricuspid Valve: The tricuspid valve is normal in structure. Tricuspid valve regurgitation is not demonstrated. No evidence of tricuspid stenosis. Aortic Valve: The aortic valve is normal in structure. Aortic  valve regurgitation is not visualized. No aortic stenosis is present. Pulmonic Valve: The pulmonic valve was normal in structure. Pulmonic valve regurgitation is not visualized. No evidence of pulmonic stenosis. Aorta: The aortic root is normal in size and structure. Venous: The inferior vena cava is  normal in size with greater than 50% respiratory variability, suggesting right atrial pressure of 3 mmHg. IAS/Shunts: No atrial level shunt detected by color flow Doppler.  LEFT VENTRICLE PLAX 2D LVIDd:         4.90 cm LVIDs:         3.40 cm LV PW:         1.00 cm LV IVS:        1.20 cm LVOT diam:     2.00 cm LV SV:         48 LV SV Index:   19 LVOT Area:     3.14 cm  RIGHT VENTRICLE TAPSE (M-mode): 1.7 cm LEFT ATRIUM             Index       RIGHT ATRIUM           Index LA diam:        3.50 cm 1.40 cm/m  RA Area:     14.30 cm LA Vol (A2C):   43.4 ml 17.32 ml/m RA Volume:   32.20 ml  12.85 ml/m LA Vol (A4C):   58.8 ml 23.46 ml/m LA Biplane Vol: 52.2 ml 20.83 ml/m  AORTIC VALVE LVOT Vmax:   83.13 cm/s LVOT Vmean:  59.433 cm/s LVOT VTI:    0.154 m  AORTA Ao Root diam: 3.30 cm Ao Asc diam:  3.30 cm  SHUNTS Systemic VTI:  0.15 m Systemic Diam: 2.00 cm Rachelle Hora Croitoru MD Electronically signed by Thurmon Fair MD Signature Date/Time: 12/01/2020/4:02:10 PM    Final

## 2020-12-05 LAB — COMPREHENSIVE METABOLIC PANEL
ALT: 92 U/L — ABNORMAL HIGH (ref 0–44)
AST: 36 U/L (ref 15–41)
Albumin: 2.9 g/dL — ABNORMAL LOW (ref 3.5–5.0)
Alkaline Phosphatase: 66 U/L (ref 38–126)
Anion gap: 12 (ref 5–15)
BUN: 37 mg/dL — ABNORMAL HIGH (ref 6–20)
CO2: 21 mmol/L — ABNORMAL LOW (ref 22–32)
Calcium: 9.3 mg/dL (ref 8.9–10.3)
Chloride: 106 mmol/L (ref 98–111)
Creatinine, Ser: 1.11 mg/dL (ref 0.61–1.24)
GFR, Estimated: >60 mL/min (ref >60)
Glucose, Bld: 133 mg/dL — ABNORMAL HIGH (ref 70–99)
Potassium: 4.7 mmol/L (ref 3.5–5.1)
Sodium: 139 mmol/L (ref 135–145)
Total Bilirubin: 0.8 mg/dL (ref 0.3–1.2)
Total Protein: 6 g/dL — ABNORMAL LOW (ref 6.5–8.1)

## 2020-12-05 LAB — GLUCOSE, CAPILLARY
Glucose-Capillary: 167 mg/dL — ABNORMAL HIGH (ref 70–99)
Glucose-Capillary: 249 mg/dL — ABNORMAL HIGH (ref 70–99)
Glucose-Capillary: 250 mg/dL — ABNORMAL HIGH (ref 70–99)
Glucose-Capillary: 249 mg/dL — ABNORMAL HIGH (ref 70–99)

## 2020-12-05 LAB — CBC
HCT: 45.4 % (ref 39.0–52.0)
Hemoglobin: 16 g/dL (ref 13.0–17.0)
MCH: 31.2 pg (ref 26.0–34.0)
MCHC: 35.2 g/dL (ref 30.0–36.0)
MCV: 88.5 fL (ref 80.0–100.0)
Platelets: 318 10*3/uL (ref 150–400)
RBC: 5.13 MIL/uL (ref 4.22–5.81)
RDW: 13.2 % (ref 11.5–15.5)
WBC: 16.8 10*3/uL — ABNORMAL HIGH (ref 4.0–10.5)
nRBC: 0.2 % (ref 0.0–0.2)

## 2020-12-05 LAB — D-DIMER, QUANTITATIVE: D-Dimer, Quant: 0.57 ug/mL-FEU — ABNORMAL HIGH (ref 0.00–0.50)

## 2020-12-05 LAB — C-REACTIVE PROTEIN: CRP: 0.5 mg/dL (ref <1.0)

## 2020-12-05 MED ORDER — LORAZEPAM 2 MG/ML IJ SOLN
1.0000 mg | Freq: Once | INTRAMUSCULAR | Status: DC | PRN
  Filled 2020-12-05: qty 1

## 2020-12-05 MED ORDER — MELATONIN 5 MG PO TABS
5.0000 mg | ORAL_TABLET | Freq: Every evening | ORAL | Status: DC | PRN
  Administered 2020-12-06 – 2020-12-07 (×2): 5 mg via ORAL
  Filled 2020-12-05 (×4): qty 1

## 2020-12-05 NOTE — Progress Notes (Signed)
PROGRESS NOTE                                                                                                                                                                                                             Patient Demographics:    Alejandro DriverRandy Kasinger, is a 59 y.o. male, DOB - 07/29/1961, WUJ:[id number]  Outpatient Primary MD for the patient is Barbie BannerWilson, Fred H, MD   Admit date - 11/27/2020   LOS - 7  Chief Complaint  Patient presents with  . Covid Positive  . Chest Pain       Brief Narrative: Patient is a 59 y.o. male with PMHx of HTN, chronic anxiety, obesity-who tested positive for COVID-19 on 11/29-admitted for acute hypoxic respiratory failure due to COVID-19 pneumonia-further hospital course complicated by atrial fibrillation with RVR.  See below for further details.  COVID-19 vaccinated status: Unvaccinated  Significant Events: 11/29>> COVID-19 positive (results available in care everywhere) 12/4>> [facility] COVID-19  Significant studies: 12/3>> chest x-ray: Multifocal pneumonia 12/4>> CTA chest: Bilateral multifocal pneumonia, no PE. 12/7>> Echo: EF 60-65%  COVID-19 medications: Steroids: 12/3>> Remdesivir: 12/3>> 12/8 Baricitinib: 12/4>>  Antibiotics: Rocephin: 12/4>> 12/8 Zithromax: 12/4>> 12/8  Microbiology data: 12/4 >>blood culture: No growth  Procedures: None  Consults: None  DVT prophylaxis: apixaban (ELIQUIS) tablet 5 mg     Subjective:   Feels better but still requiring around 7 L of HFNC.  No major issues overnight-inquiring when he can be discharged.   Assessment  & Plan :   Acute Hypoxic Resp Failure due to Covid 19 Viral pneumonia: Continues to have severe hypoxemia-has remained stable for the past several days-on around 7 L of HFNC.  No evidence of volume overload.  Continue steroids and baricitinib.  Continue attempt to titrate down FiO2-continue  attempts to mobilize-encourage use of incentive spirometry.   Fever: afebrile O2 requirements:  SpO2: 92 % O2 Flow Rate (L/min): 8 L/min   COVID-19 Labs: Recent Labs    12/03/20 0125 12/04/20 0130 12/05/20 0250  DDIMER 0.51* 0.62* 0.57*  CRP 0.8 0.6 0.5       Component Value Date/Time   BNP 195.9 (H) 12/01/2020 0057    Recent Labs  Lab 11/29/20 0349  PROCALCITON 0.34    No results found for: SARSCOV2NAA    Prone/Incentive Spirometry:  encouraged patient to lie prone for 3-4 hours at a time for a total of 16 hours a day, and to encourage incentive spirometry use 3-4/hour  Transaminitis: Mild-stable for close follow-up for now.  Etiology likely due to COVID-19 related inflammation.  A. fib with RVR: Rate controlled with Coreg-anticoagulated with Eliquis.  Needs outpatient follow-up with cardiology-for consideration for cardioversion if he remains in atrial fibrillation.  Hyperkalemia: Resolved-stop Lokelma-continue to hold lisinopril  HTN: BP controlled-on the soft side this morning-continue Coreg-hold lisinopril for now.  Follow and adjust.    DM-2 (A1c 8.3 on 12/6) with uncontrolled hyperglycemia due to steroid use: CBGs remain on the higher side-CBGs this morning better controlled compared to the past few days-steroid dose has been tapered down-continue Lantus 25 units, 6 units of NovoLog with meals and SSI-follow and readjust.  Suspect that as steroids continue to get tapered down-CBGs will improve further.   Recent Labs    12/04/20 1947 12/04/20 2055 12/05/20 0752  GLUCAP 281* 284* 167*    Depression/anxiety: Appears stable-continue Celexa.  GI prophylaxis: PPI  ABG: No results found for: PHART, PCO2ART, PO2ART, HCO3, TCO2, ACIDBASEDEF, O2SAT  Vent Settings: N/A  Condition - Extremely Guarded  Family Communication  :  Spouse [name] [phone]) updated over the phone on 12/11  Code Status :  Full Code  Diet :  Diet Order            Diet heart  healthy/carb modified Room service appropriate? Yes; Fluid consistency: Thin  Diet effective now                  Disposition Plan  :   Status is: Inpatient  Remains inpatient appropriate because:Inpatient level of care appropriate due to severity of illness   Dispo:  Patient From: Home  Planned Disposition: Home  Expected discharge date: 12/09/2020  Medically stable for discharge: No  Barriers to discharge: Hypoxia requiring O2 supplementation  Antimicorbials  :    Anti-infectives (From admission, onward)   Start     Dose/Rate Route Frequency Ordered Stop   11/29/20 1000  remdesivir 100 mg in sodium chloride 0.9 % 100 mL IVPB       "Followed by" Linked Group Details   100 mg 200 mL/hr over 30 Minutes Intravenous Daily 11/28/20 0155 12/02/20 0915   11/28/20 1100  cefTRIAXone (ROCEPHIN) 2 g in sodium chloride 0.9 % 100 mL IVPB        2 g 200 mL/hr over 30 Minutes Intravenous Every 24 hours 11/28/20 1059 12/02/20 1220   11/28/20 0830  azithromycin (ZITHROMAX) 500 mg in sodium chloride 0.9 % 250 mL IVPB        500 mg 250 mL/hr over 60 Minutes Intravenous Daily 11/28/20 0808 12/02/20 1115   11/28/20 0830  cefTRIAXone (ROCEPHIN) 2 g in sodium chloride 0.9 % 100 mL IVPB  Status:  Discontinued        2 g 200 mL/hr over 30 Minutes Intravenous Daily 11/28/20 0808 11/28/20 0811   11/28/20 0300  remdesivir 200 mg in sodium chloride 0.9% 250 mL IVPB       "Followed by" Linked Group Details   200 mg 580 mL/hr over 30 Minutes Intravenous Once 11/28/20 0155 11/28/20 0415      Inpatient Medications  Scheduled Meds: . apixaban  5 mg Oral BID  . vitamin C  500 mg Oral Daily  . baricitinib  4 mg Oral Daily  . carvedilol  6.25 mg Oral BID WC  . cholecalciferol  1,000 Units Oral Daily  . citalopram  20 mg Oral Daily  . fentaNYL (SUBLIMAZE) injection  50 mcg Intravenous Once  . insulin aspart  0-20 Units Subcutaneous TID WC  . insulin aspart  0-5 Units Subcutaneous QHS  . insulin  aspart  6 Units Subcutaneous TID WC  . insulin detemir  35 Units Subcutaneous Daily  . Ipratropium-Albuterol  1 puff Inhalation Q6H  . linagliptin  5 mg Oral Daily  . methylPREDNISolone (SOLU-MEDROL) injection  60 mg Intravenous Q12H  . multivitamin with minerals  1 tablet Oral Daily  . pantoprazole  40 mg Oral Daily  . polyethylene glycol  17 g Oral Daily  . zinc sulfate  220 mg Oral Daily   Continuous Infusions: PRN Meds:.bisacodyl, guaiFENesin-dextromethorphan, sodium chloride   Time Spent in minutes  25  See all Orders from today for further details   Jeoffrey Massed M.D on 12/05/2020 at 10:24 AM  To page go to [email or web address] - use universal password  [facility] -  Office  [phone]    Objective:   Vitals:   12/04/20 1503 12/04/20 2128 12/05/20 0500 12/05/20 0522  BP: 117/79 125/84  110/82  Pulse:  86  82  Resp:  20  16  Temp: 98 F (36.7 C) 98.1 F (36.7 C)  98.3 F (36.8 C)  TempSrc: Oral Axillary  Axillary  SpO2: 94% 94%  92%  Weight:   102 kg   Height:        Wt Readings from Last 3 Encounters:  12/05/20 102 kg  11/14/16 122.5 kg     Intake/Output Summary (Last 24 hours) at 12/05/2020 1024 Last data filed at 12/04/2020 1505 Gross per 24 hour  Intake 240 ml  Output --  Net 240 ml     Physical Exam Gen Exam:Alert awake-not in any distress HEENT:atraumatic, normocephalic Chest: B/L clear to auscultation anteriorly CVS:S1S2 regular Abdomen:soft non tender, non distended Extremities:no edema Neurology: Non focal Skin: no rash   Data Review:    CBC Recent Labs  Lab 11/29/20 0349 11/30/20 0353 12/01/20 0057 12/02/20 0147 12/03/20 0125 12/04/20 0130 12/05/20 0250  WBC 7.6 11.7* 11.9* 13.5* 17.5* 20.6* 16.8*  HGB 13.9 13.8 14.2 14.9 15.5 15.1 16.0  HCT 42.2 40.3 40.4 42.7 45.7 44.5 45.4  PLT 173 232 279 296 345 380 318  MCV 92.3 91.0 88.6 88.2 89.3 88.1 88.5  MCH 30.4 31.2 31.1 30.8 30.3 29.9 31.2  MCHC 32.9 34.2 35.1 34.9  33.9 33.9 35.2  RDW 13.2 13.1 13.1 13.2 12.9 13.1 13.2  LYMPHSABS 0.7 0.6* 0.5* 0.6* 0.7  --   --   MONOABS 0.5 0.8 1.0 0.9 1.0  --   --   EOSABS 0.0 0.0 0.0 0.0 0.0  --   --   BASOSABS 0.0 0.0 0.1 0.1 0.1  --   --     Chemistries  Recent Labs  Lab 11/29/20 0349 11/30/20 0353 12/01/20 0057 12/02/20 0147 12/03/20 0125 12/04/20 0130 12/05/20 0250  NA 138 140 142 140 137 138 139  K 3.9 4.3 4.1 4.3 5.4* 4.2 4.7  CL 104 105 107 107 104 105 106  CO2 23 24 22 23 22 22  21*  GLUCOSE 268* 274* 227* 218* 250* 184* 133*  BUN 20 30* 32* 38* 32* 34* 37*  CREATININE 1.00 1.07 0.92 1.05 1.14 1.00 1.11  CALCIUM 8.8* 9.1 9.0 9.1 9.2 9.0 9.3  MG 2.1 2.5*  --   --   --   --   --  AST 58* 53* 76* 66* 56* 38 36  ALT 70* 68* 98* 118* 110* 96* 92*  ALKPHOS 60 56 61 69 67 64 66  BILITOT 0.6 0.3 0.7 0.7 1.2 0.8 0.8   ------------------------------------------------------------------------------------------------------------------ No results for input(s): CHOL, HDL, LDLCALC, TRIG, CHOLHDL, LDLDIRECT in the last 72 hours.  Lab Results  Component Value Date   HGBA1C 8.3 (H) 11/30/2020   ------------------------------------------------------------------------------------------------------------------ No results for input(s): TSH, T4TOTAL, T3FREE, THYROIDAB in the last 72 hours.  Invalid input(s): FREET3 ------------------------------------------------------------------------------------------------------------------ No results for input(s): VITAMINB12, FOLATE, FERRITIN, TIBC, IRON, RETICCTPCT in the last 72 hours.  Coagulation profile No results for input(s): INR, PROTIME in the last 168 hours.  Recent Labs    12/04/20 0130 12/05/20 0250  DDIMER 0.62* 0.57*    Cardiac Enzymes No results for input(s): CKMB, TROPONINI, MYOGLOBIN in the last 168 hours.  Invalid input(s): CK ------------------------------------------------------------------------------------------------------------------     Component Value Date/Time   BNP 195.9 (H) 12/01/2020 0057    Micro Results Recent Results (from the past 240 hour(s))  Blood Culture (routine x 2)     Status: None   Collection Time: 11/28/20  2:40 AM   Specimen: BLOOD  Result Value Ref Range Status   Specimen Description BLOOD LEFT ANTECUBITAL  Final   Special Requests   Final    BOTTLES DRAWN AEROBIC AND ANAEROBIC Blood Culture adequate volume   Culture   Final    NO GROWTH 5 DAYS Performed at [facility], [address], [place], [place] [address]    Report Status 12/03/2020 FINAL  Final  Blood Culture (routine x 2)     Status: None   Collection Time: 11/28/20  2:40 AM   Specimen: BLOOD LEFT HAND  Result Value Ref Range Status   Specimen Description BLOOD LEFT HAND  Final   Special Requests   Final    BOTTLES DRAWN AEROBIC AND ANAEROBIC Blood Culture results may not be optimal due to an inadequate volume of blood received in culture bottles   Culture   Final    NO GROWTH 5 DAYS Performed at [facility], [address] [place], [place] [address]    Report Status 12/03/2020 FINAL  Final    Radiology Reports CT Angio Chest PE W/Cm &/Or Wo Cm  Result Date: 11/28/2020 CLINICAL DATA:  Chest pain. EXAM: CT ANGIOGRAPHY CHEST WITH CONTRAST TECHNIQUE: Multidetector CT imaging of the chest was performed using the standard protocol during bolus administration of intravenous contrast. Multiplanar CT image reconstructions and MIPs were obtained to evaluate the vascular anatomy. CONTRAST:  82mL OMNIPAQUE IOHEXOL 350 MG/ML SOLN COMPARISON:  None. FINDINGS: Cardiovascular: Some of the most peripheral segmental and subsegmental pulmonary artery branches cannot be definitively characterize due to mild patient breathing motion artifact, however, there is no pulmonary embolism identified within the main, lobar or central segmental pulmonary arteries bilaterally. No pericardial effusion. No thoracic aortic aneurysm or evidence of  aortic dissection. Mediastinum/Nodes: No mass or enlarged lymph nodes are seen within the mediastinum. Esophagus is unremarkable. Trachea is unremarkable. Lungs/Pleura: Patchy ground-glass airspace opacities are seen throughout both lungs, RIGHT slightly greater than LEFT, consistent with multifocal pneumonia. No pleural effusion or pneumothorax. Upper Abdomen: Liver is low in density suggesting fatty infiltration, incompletely imaged. Limited images of the upper abdomen are otherwise unremarkable. Musculoskeletal: No acute or suspicious osseous finding. Mild degenerative spurring within the thoracic spine. Review of the MIP images confirms the above findings. IMPRESSION: 1. Bilateral multifocal pneumonia. 2. No pulmonary embolism  seen. 3. Fatty infiltration of the liver. Electronically Signed   By: Bary Richard M.D.   On: 11/28/2020 07:56   DG Chest Portable 1 View  Result Date: 11/27/2020 CLINICAL DATA:  COVID-19 positive, chest pain EXAM: PORTABLE CHEST 1 VIEW COMPARISON:  Radiograph 11/14/2016 FINDINGS: Heterogeneous mixed consolidative and interstitial opacities present the mid to lower lungs in a slight peripheral predominance compatible reported imaging features of COVID 19 pneumonia. No pneumothorax. No effusion. The cardiomediastinal contours are unremarkable for portable technique. No acute osseous or soft tissue abnormality. IMPRESSION: Appearance compatible with a multifocal pneumonia in the setting of COVID 19. Electronically Signed   By: Kreg Shropshire M.D.   On: 11/27/2020 18:26   ECHOCARDIOGRAM COMPLETE  Result Date: 12/01/2020    ECHOCARDIOGRAM REPORT   Patient Name:   ARIQ KHAMIS Date of Exam: 12/01/2020 Medical Rec #:  [id number]    Height:       78.0 in Accession #:    [id number]   Weight:       255.7 lb Date of Birth:  March 13, 1961   BSA:          2.506 m Patient Age:    58 years     BP:           123/88 mmHg Patient Gender: M            HR:           80 bpm. Exam Location:  Inpatient  Procedure: 2D Echo, Cardiac Doppler and Color Doppler Indications:    Atrial Fibrillation 427.31 / I48.91  History:        Patient has no prior history of Echocardiogram examinations.                 Risk Factors:Hypertension.  Sonographer:    Eulah Pont RDCS Referring Phys: [id number] Deno Lunger SHALHOUB IMPRESSIONS  1. Left ventricular ejection fraction, by estimation, is 60 to 65%. The left ventricle has normal function. The left ventricle has no regional wall motion abnormalities. Left ventricular diastolic function could not be evaluated.  2. Right ventricular systolic function is normal. The right ventricular size is normal.  3. The mitral valve is normal in structure. No evidence of mitral valve regurgitation. No evidence of mitral stenosis.  4. The aortic valve is normal in structure. Aortic valve regurgitation is not visualized. No aortic stenosis is present.  5. The inferior vena cava is normal in size with greater than 50% respiratory variability, suggesting right atrial pressure of 3 mmHg. FINDINGS  Left Ventricle: Left ventricular ejection fraction, by estimation, is 60 to 65%. The left ventricle has normal function. The left ventricle has no regional wall motion abnormalities. The left ventricular internal cavity size was normal in size. There is  no left ventricular hypertrophy. Left ventricular diastolic function could not be evaluated due to atrial fibrillation. Left ventricular diastolic function could not be evaluated. Right Ventricle: The right ventricular size is normal. No increase in right ventricular wall thickness. Right ventricular systolic function is normal. Left Atrium: Left atrial size was normal in size. Right Atrium: Right atrial size was normal in size. Pericardium: There is no evidence of pericardial effusion. Mitral Valve: The mitral valve is normal in structure. No evidence of mitral valve regurgitation. No evidence of mitral valve stenosis. Tricuspid Valve: The tricuspid valve is  normal in structure. Tricuspid valve regurgitation is not demonstrated. No evidence of tricuspid stenosis. Aortic Valve: The aortic valve is normal in structure. Aortic valve regurgitation  is not visualized. No aortic stenosis is present. Pulmonic Valve: The pulmonic valve was normal in structure. Pulmonic valve regurgitation is not visualized. No evidence of pulmonic stenosis. Aorta: The aortic root is normal in size and structure. Venous: The inferior vena cava is normal in size with greater than 50% respiratory variability, suggesting right atrial pressure of 3 mmHg. IAS/Shunts: No atrial level shunt detected by color flow Doppler.  LEFT VENTRICLE PLAX 2D LVIDd:         4.90 cm LVIDs:         3.40 cm LV PW:         1.00 cm LV IVS:        1.20 cm LVOT diam:     2.00 cm LV SV:         48 LV SV Index:   19 LVOT Area:     3.14 cm  RIGHT VENTRICLE TAPSE (M-mode): 1.7 cm LEFT ATRIUM             Index       RIGHT ATRIUM           Index LA diam:        3.50 cm 1.40 cm/m  RA Area:     14.30 cm LA Vol (A2C):   43.4 ml 17.32 ml/m RA Volume:   32.20 ml  12.85 ml/m LA Vol (A4C):   58.8 ml 23.46 ml/m LA Biplane Vol: 52.2 ml 20.83 ml/m  AORTIC VALVE LVOT Vmax:   83.13 cm/s LVOT Vmean:  59.433 cm/s LVOT VTI:    0.154 m  AORTA Ao Root diam: 3.30 cm Ao Asc diam:  3.30 cm  SHUNTS Systemic VTI:  0.15 m Systemic Diam: 2.00 cm Rachelle Hora Croitoru MD Electronically signed by Thurmon Fair MD Signature Date/Time: 12/01/2020/4:02:10 PM    Final

## 2020-12-06 LAB — COMPREHENSIVE METABOLIC PANEL
ALT: 95 U/L — ABNORMAL HIGH (ref 0–44)
AST: 38 U/L (ref 15–41)
Albumin: 3 g/dL — ABNORMAL LOW (ref 3.5–5.0)
Alkaline Phosphatase: 66 U/L (ref 38–126)
Anion gap: 11 (ref 5–15)
BUN: 33 mg/dL — ABNORMAL HIGH (ref 6–20)
CO2: 24 mmol/L (ref 22–32)
Calcium: 9.3 mg/dL (ref 8.9–10.3)
Chloride: 102 mmol/L (ref 98–111)
Creatinine, Ser: 1.05 mg/dL (ref 0.61–1.24)
GFR, Estimated: >60 mL/min (ref >60)
Glucose, Bld: 137 mg/dL — ABNORMAL HIGH (ref 70–99)
Potassium: 5 mmol/L (ref 3.5–5.1)
Sodium: 137 mmol/L (ref 135–145)
Total Bilirubin: 1 mg/dL (ref 0.3–1.2)
Total Protein: 6.2 g/dL — ABNORMAL LOW (ref 6.5–8.1)

## 2020-12-06 LAB — GLUCOSE, CAPILLARY
Glucose-Capillary: 148 mg/dL — ABNORMAL HIGH (ref 70–99)
Glucose-Capillary: 142 mg/dL — ABNORMAL HIGH (ref 70–99)
Glucose-Capillary: 259 mg/dL — ABNORMAL HIGH (ref 70–99)
Glucose-Capillary: 293 mg/dL — ABNORMAL HIGH (ref 70–99)

## 2020-12-06 LAB — CBC
HCT: 48 % (ref 39.0–52.0)
Hemoglobin: 16.2 g/dL (ref 13.0–17.0)
MCH: 30.2 pg (ref 26.0–34.0)
MCHC: 33.8 g/dL (ref 30.0–36.0)
MCV: 89.4 fL (ref 80.0–100.0)
Platelets: 336 10*3/uL (ref 150–400)
RBC: 5.37 MIL/uL (ref 4.22–5.81)
RDW: 13.3 % (ref 11.5–15.5)
WBC: 18.2 10*3/uL — ABNORMAL HIGH (ref 4.0–10.5)
nRBC: 0.1 % (ref 0.0–0.2)

## 2020-12-06 LAB — C-REACTIVE PROTEIN: CRP: 0.6 mg/dL (ref <1.0)

## 2020-12-06 LAB — D-DIMER, QUANTITATIVE: D-Dimer, Quant: 0.54 ug/mL-FEU — ABNORMAL HIGH (ref 0.00–0.50)

## 2020-12-06 MED ORDER — METOPROLOL TARTRATE 25 MG PO TABS
25.0000 mg | ORAL_TABLET | Freq: Three times a day (TID) | ORAL | Status: DC
  Administered 2020-12-06 – 2020-12-07 (×3): 25 mg via ORAL
  Filled 2020-12-06 (×3): qty 1

## 2020-12-06 MED ORDER — FUROSEMIDE 10 MG/ML IJ SOLN
20.0000 mg | Freq: Once | INTRAMUSCULAR | Status: AC
  Administered 2020-12-06: 16:00:00 20 mg via INTRAVENOUS
  Filled 2020-12-06: qty 2

## 2020-12-06 NOTE — Progress Notes (Signed)
PROGRESS NOTE                                                                                                                                                                                                             Patient Demographics:    Alejandro Bowen, is a 59 y.o. male, DOB - May 05, 1961, TML:[id number]  Outpatient Primary MD for the patient is [name], MD   Admit date - 11/27/2020   LOS - 8  Chief Complaint  Patient presents with  . Covid Positive  . Chest Pain       Brief Narrative: Patient is a 59 y.o. male with PMHx of HTN, chronic anxiety, obesity-who tested positive for COVID-19 on 11/29-admitted for acute hypoxic respiratory failure due to COVID-19 pneumonia-further hospital course complicated by atrial fibrillation with RVR.  See below for further details.  COVID-19 vaccinated status: Unvaccinated  Significant Events: 11/29>> COVID-19 positive (results available in care everywhere) 12/4>> Admit to [facility] for hypoxia due to COVID-19  Significant studies: 12/3>> chest x-ray: Multifocal pneumonia 12/4>> CTA chest: Bilateral multifocal pneumonia, no PE. 12/7>> Echo: EF 60-65%  COVID-19 medications: Steroids: 12/3>> Remdesivir: 12/3>> 12/8 Baricitinib: 12/4>>  Antibiotics: Rocephin: 12/4>> 12/8 Zithromax: 12/4>> 12/8  Microbiology data: 12/4 >>blood culture: No growth  Procedures: None  Consults: None  DVT prophylaxis: apixaban (ELIQUIS) tablet 5 mg     Subjective:   Lying comfortably in bed-thinks his shortness of breath is better.  Was titrated down to 4-5 L of oxygen.   Assessment  & Plan :   Acute Hypoxic Resp Failure due to Covid 19 Viral pneumonia: Had severe hypoxemia but has made gradual improvement today-down to 4-5 L.  Although no evidence of volume overload-we will give 1 dose of IV Lasix to maintain negative balance.  Remains on steroids/baricitinib-continue to follow  closely.    Fever: afebrile O2 requirements:  SpO2: 93 % O2 Flow Rate (L/min): (S) 4 L/min   COVID-19 Labs: Recent Labs    12/04/20 0130 12/05/20 0250 12/06/20 0531  DDIMER 0.62* 0.57* 0.54*  CRP 0.6 0.5 0.6       Component Value Date/Time   BNP 195.9 (H) 12/01/2020 0057    No results for input(s): PROCALCITON in the last 168 hours.  No results found for: SARSCOV2NAA    Prone/Incentive Spirometry: encouraged patient to lie prone  for 3-4 hours at a time for a total of 16 hours a day, and to encourage incentive spirometry use 3-4/hour  Transaminitis: Mild-stable for close follow-up for now.  Etiology likely due to COVID-19 related inflammation.  A. fib with RVR: Remains in atrial fibrillation-but rate relatively well controlled-switch from Coreg to metoprolol-continue Eliquis.  Needs outpatient follow-up with cardiology-for consideration for cardioversion if he remains in atrial fibrillation.  Hyperkalemia: Resolved-stop Lokelma-continue to hold lisinopril  HTN: BP controlled-continue beta-blocker-lisinopril remains on hold.  DM-2 (A1c 8.3 on 12/6) with uncontrolled hyperglycemia due to steroid use: CBGs remain on the higher side-CBGs this morning better controlled compared to the past few days-steroid dose has been tapered down-continue Lantus 25 units, 6 units of NovoLog with meals and SSI-follow and readjust.  Suspect that as steroids continue to get tapered down-CBGs will improve further.   Recent Labs    12/05/20 2114 12/06/20 0743 12/06/20 1213  GLUCAP 249* 148* 142*    Depression/anxiety: Appears stable-continue Celexa.  GI prophylaxis: PPI  ABG: No results found for: PHART, PCO2ART, PO2ART, HCO3, TCO2, ACIDBASEDEF, O2SAT  Vent Settings: N/A  Condition - Extremely Guarded  Family Communication  :  Spouse Alejandro Bowen [phone]) updated over the phone on 12/12  Code Status :  Full Code  Diet :  Diet Order            Diet heart healthy/carb modified  Room service appropriate? Yes; Fluid consistency: Thin  Diet effective now                  Disposition Plan  :   Status is: Inpatient  Remains inpatient appropriate because:Inpatient level of care appropriate due to severity of illness   Dispo:  Patient From: Home  Planned Disposition: Home  Expected discharge date: 12/09/2020  Medically stable for discharge: No  Barriers to discharge: Hypoxia requiring O2 supplementation  Antimicorbials  :    Anti-infectives (From admission, onward)   Start     Dose/Rate Route Frequency Ordered Stop   11/29/20 1000  remdesivir 100 mg in sodium chloride 0.9 % 100 mL IVPB       "Followed by" Linked Group Details   100 mg 200 mL/hr over 30 Minutes Intravenous Daily 11/28/20 0155 12/02/20 0915   11/28/20 1100  cefTRIAXone (ROCEPHIN) 2 g in sodium chloride 0.9 % 100 mL IVPB        2 g 200 mL/hr over 30 Minutes Intravenous Every 24 hours 11/28/20 1059 12/02/20 1220   11/28/20 0830  azithromycin (ZITHROMAX) 500 mg in sodium chloride 0.9 % 250 mL IVPB        500 mg 250 mL/hr over 60 Minutes Intravenous Daily 11/28/20 0808 12/02/20 1115   11/28/20 0830  cefTRIAXone (ROCEPHIN) 2 g in sodium chloride 0.9 % 100 mL IVPB  Status:  Discontinued        2 g 200 mL/hr over 30 Minutes Intravenous Daily 11/28/20 0808 11/28/20 0811   11/28/20 0300  remdesivir 200 mg in sodium chloride 0.9% 250 mL IVPB       "Followed by" Linked Group Details   200 mg 580 mL/hr over 30 Minutes Intravenous Once 11/28/20 0155 11/28/20 0415      Inpatient Medications  Scheduled Meds: . apixaban  5 mg Oral BID  . vitamin C  500 mg Oral Daily  . baricitinib  4 mg Oral Daily  . carvedilol  6.25 mg Oral BID WC  . cholecalciferol  1,000 Units Oral Daily  . citalopram  20  mg Oral Daily  . fentaNYL (SUBLIMAZE) injection  50 mcg Intravenous Once  . insulin aspart  0-20 Units Subcutaneous TID WC  . insulin aspart  0-5 Units Subcutaneous QHS  . insulin aspart  6 Units  Subcutaneous TID WC  . insulin detemir  35 Units Subcutaneous Daily  . Ipratropium-Albuterol  1 puff Inhalation Q6H  . linagliptin  5 mg Oral Daily  . methylPREDNISolone (SOLU-MEDROL) injection  60 mg Intravenous Q12H  . multivitamin with minerals  1 tablet Oral Daily  . pantoprazole  40 mg Oral Daily  . polyethylene glycol  17 g Oral Daily  . zinc sulfate  220 mg Oral Daily   Continuous Infusions: PRN Meds:.bisacodyl, guaiFENesin-dextromethorphan, LORazepam, melatonin, sodium chloride   Time Spent in minutes  25  See all Orders from today for further details   Jeoffrey MassedShanker Jah Alarid M.D on 12/06/2020 at 2:52 PM  To page go to [email or web address] - use universal password  [facility] -  Office  [phone]    Objective:   Vitals:   12/05/20 2000 12/06/20 0421 12/06/20 0740 12/06/20 1351  BP: 128/76 111/68 120/88 100/62  Pulse: 94 85 76 98  Resp: 20 (!) 22 20 20   Temp: 98.3 F (36.8 C) 98.1 F (36.7 C) 98.2 F (36.8 C) 97.7 F (36.5 C)  TempSrc: Oral Oral Oral Oral  SpO2: 95% 93% 91% 93%  Weight:  102.5 kg    Height:        Wt Readings from Last 3 Encounters:  12/06/20 102.5 kg  11/14/16 122.5 kg     Intake/Output Summary (Last 24 hours) at 12/06/2020 1452 Last data filed at 12/06/2020 0426 Gross per 24 hour  Intake 360 ml  Output --  Net 360 ml     Physical Exam Gen Exam:Alert awake-not in any distress HEENT:atraumatic, normocephalic Chest: B/L clear to auscultation anteriorly CVS:S1S2 regular Abdomen:soft non tender, non distended Extremities:no edema Neurology: Non focal Skin: no rash   Data Review:    CBC Recent Labs  Lab 11/30/20 0353 12/01/20 0057 12/02/20 0147 12/03/20 0125 12/04/20 0130 12/05/20 0250 12/06/20 0531  WBC 11.7* 11.9* 13.5* 17.5* 20.6* 16.8* 18.2*  HGB 13.8 14.2 14.9 15.5 15.1 16.0 16.2  HCT 40.3 40.4 42.7 45.7 44.5 45.4 48.0  PLT 232 279 296 345 380 318 336  MCV 91.0 88.6 88.2 89.3 88.1 88.5 89.4  MCH 31.2 31.1 30.8  30.3 29.9 31.2 30.2  MCHC 34.2 35.1 34.9 33.9 33.9 35.2 33.8  RDW 13.1 13.1 13.2 12.9 13.1 13.2 13.3  LYMPHSABS 0.6* 0.5* 0.6* 0.7  --   --   --   MONOABS 0.8 1.0 0.9 1.0  --   --   --   EOSABS 0.0 0.0 0.0 0.0  --   --   --   BASOSABS 0.0 0.1 0.1 0.1  --   --   --     Chemistries  Recent Labs  Lab 11/30/20 0353 12/01/20 0057 12/02/20 0147 12/03/20 0125 12/04/20 0130 12/05/20 0250 12/06/20 0531  NA 140   < > 140 137 138 139 137  K 4.3   < > 4.3 5.4* 4.2 4.7 5.0  CL 105   < > 107 104 105 106 102  CO2 24   < > 23 22 22  21* 24  GLUCOSE 274*   < > 218* 250* 184* 133* 137*  BUN 30*   < > 38* 32* 34* 37* 33*  CREATININE 1.07   < > 1.05 1.14 1.00 1.11 1.05  CALCIUM 9.1   < > 9.1 9.2 9.0 9.3 9.3  MG 2.5*  --   --   --   --   --   --   AST 53*   < > 66* 56* 38 36 38  ALT 68*   < > 118* 110* 96* 92* 95*  ALKPHOS 56   < > 69 67 64 66 66  BILITOT 0.3   < > 0.7 1.2 0.8 0.8 1.0   < > = values in this interval not displayed.   ------------------------------------------------------------------------------------------------------------------ No results for input(s): CHOL, HDL, LDLCALC, TRIG, CHOLHDL, LDLDIRECT in the last 72 hours.  Lab Results  Component Value Date   HGBA1C 8.3 (H) 11/30/2020   ------------------------------------------------------------------------------------------------------------------ No results for input(s): TSH, T4TOTAL, T3FREE, THYROIDAB in the last 72 hours.  Invalid input(s): FREET3 ------------------------------------------------------------------------------------------------------------------ No results for input(s): VITAMINB12, FOLATE, FERRITIN, TIBC, IRON, RETICCTPCT in the last 72 hours.  Coagulation profile No results for input(s): INR, PROTIME in the last 168 hours.  Recent Labs    12/05/20 0250 12/06/20 0531  DDIMER 0.57* 0.54*    Cardiac Enzymes No results for input(s): CKMB, TROPONINI, MYOGLOBIN in the last 168 hours.  Invalid  input(s): CK ------------------------------------------------------------------------------------------------------------------    Component Value Date/Time   BNP 195.9 (H) 12/01/2020 0057    Micro Results Recent Results (from the past 240 hour(s))  Blood Culture (routine x 2)     Status: None   Collection Time: 11/28/20  2:40 AM   Specimen: BLOOD  Result Value Ref Range Status   Specimen Description BLOOD LEFT ANTECUBITAL  Final   Special Requests   Final    BOTTLES DRAWN AEROBIC AND ANAEROBIC Blood Culture adequate volume   Culture   Final    NO GROWTH 5 DAYS Performed at [facility], [address], [place], [place] [address]    Report Status 12/03/2020 FINAL  Final  Blood Culture (routine x 2)     Status: None   Collection Time: 11/28/20  2:40 AM   Specimen: BLOOD LEFT HAND  Result Value Ref Range Status   Specimen Description BLOOD LEFT HAND  Final   Special Requests   Final    BOTTLES DRAWN AEROBIC AND ANAEROBIC Blood Culture results may not be optimal due to an inadequate volume of blood received in culture bottles   Culture   Final    NO GROWTH 5 DAYS Performed at [facility], [address] [place], [place] [address]    Report Status 12/03/2020 FINAL  Final    Radiology Reports CT Angio Chest PE W/Cm &/Or Wo Cm  Result Date: 11/28/2020 CLINICAL DATA:  Chest pain. EXAM: CT ANGIOGRAPHY CHEST WITH CONTRAST TECHNIQUE: Multidetector CT imaging of the chest was performed using the standard protocol during bolus administration of intravenous contrast. Multiplanar CT image reconstructions and MIPs were obtained to evaluate the vascular anatomy. CONTRAST:  3mL OMNIPAQUE IOHEXOL 350 MG/ML SOLN COMPARISON:  None. FINDINGS: Cardiovascular: Some of the most peripheral segmental and subsegmental pulmonary artery branches cannot be definitively characterize due to mild patient breathing motion artifact, however, there is no pulmonary embolism identified within the  main, lobar or central segmental pulmonary arteries bilaterally. No pericardial effusion. No thoracic aortic aneurysm or evidence of aortic dissection. Mediastinum/Nodes: No mass or enlarged lymph nodes are seen within the mediastinum. Esophagus is unremarkable. Trachea is unremarkable. Lungs/Pleura: Patchy ground-glass airspace opacities are seen throughout both lungs, RIGHT slightly greater than LEFT, consistent with multifocal pneumonia. No pleural  effusion or pneumothorax. Upper Abdomen: Liver is low in density suggesting fatty infiltration, incompletely imaged. Limited images of the upper abdomen are otherwise unremarkable. Musculoskeletal: No acute or suspicious osseous finding. Mild degenerative spurring within the thoracic spine. Review of the MIP images confirms the above findings. IMPRESSION: 1. Bilateral multifocal pneumonia. 2. No pulmonary embolism seen. 3. Fatty infiltration of the liver. Electronically Signed   By: Bary Richard M.D.   On: 11/28/2020 07:56   DG Chest Portable 1 View  Result Date: 11/27/2020 CLINICAL DATA:  COVID-19 positive, chest pain EXAM: PORTABLE CHEST 1 VIEW COMPARISON:  Radiograph 11/14/2016 FINDINGS: Heterogeneous mixed consolidative and interstitial opacities present the mid to lower lungs in a slight peripheral predominance compatible reported imaging features of COVID 19 pneumonia. No pneumothorax. No effusion. The cardiomediastinal contours are unremarkable for portable technique. No acute osseous or soft tissue abnormality. IMPRESSION: Appearance compatible with a multifocal pneumonia in the setting of COVID 19. Electronically Signed   By: Kreg Shropshire M.D.   On: 11/27/2020 18:26   ECHOCARDIOGRAM COMPLETE  Result Date: 12/01/2020    ECHOCARDIOGRAM REPORT   Patient Name:   Alejandro Bowen Date of Exam: 12/01/2020 Medical Rec #:  [id number]    Height:       78.0 in Accession #:    [id number]   Weight:       255.7 lb Date of Birth:  [birth year]   BSA:          2.506 m  Patient Age:    58 years     BP:           123/88 mmHg Patient Gender: M            HR:           80 bpm. Exam Location:  Inpatient Procedure: 2D Echo, Cardiac Doppler and Color Doppler Indications:    Atrial Fibrillation 427.31 / I48.91  History:        Patient has no prior history of Echocardiogram examinations.                 Risk Factors:Hypertension.  Sonographer:    Eulah Pont RDCS Referring Phys: [id number] Deno Lunger SHALHOUB IMPRESSIONS  1. Left ventricular ejection fraction, by estimation, is 60 to 65%. The left ventricle has normal function. The left ventricle has no regional wall motion abnormalities. Left ventricular diastolic function could not be evaluated.  2. Right ventricular systolic function is normal. The right ventricular size is normal.  3. The mitral valve is normal in structure. No evidence of mitral valve regurgitation. No evidence of mitral stenosis.  4. The aortic valve is normal in structure. Aortic valve regurgitation is not visualized. No aortic stenosis is present.  5. The inferior vena cava is normal in size with greater than 50% respiratory variability, suggesting right atrial pressure of 3 mmHg. FINDINGS  Left Ventricle: Left ventricular ejection fraction, by estimation, is 60 to 65%. The left ventricle has normal function. The left ventricle has no regional wall motion abnormalities. The left ventricular internal cavity size was normal in size. There is  no left ventricular hypertrophy. Left ventricular diastolic function could not be evaluated due to atrial fibrillation. Left ventricular diastolic function could not be evaluated. Right Ventricle: The right ventricular size is normal. No increase in right ventricular wall thickness. Right ventricular systolic function is normal. Left Atrium: Left atrial size was normal in size. Right Atrium: Right atrial size was normal in size. Pericardium: There is no evidence of  pericardial effusion. Mitral Valve: The mitral valve is normal in  structure. No evidence of mitral valve regurgitation. No evidence of mitral valve stenosis. Tricuspid Valve: The tricuspid valve is normal in structure. Tricuspid valve regurgitation is not demonstrated. No evidence of tricuspid stenosis. Aortic Valve: The aortic valve is normal in structure. Aortic valve regurgitation is not visualized. No aortic stenosis is present. Pulmonic Valve: The pulmonic valve was normal in structure. Pulmonic valve regurgitation is not visualized. No evidence of pulmonic stenosis. Aorta: The aortic root is normal in size and structure. Venous: The inferior vena cava is normal in size with greater than 50% respiratory variability, suggesting right atrial pressure of 3 mmHg. IAS/Shunts: No atrial level shunt detected by color flow Doppler.  LEFT VENTRICLE PLAX 2D LVIDd:         4.90 cm LVIDs:         3.40 cm LV PW:         1.00 cm LV IVS:        1.20 cm LVOT diam:     2.00 cm LV SV:         48 LV SV Index:   19 LVOT Area:     3.14 cm  RIGHT VENTRICLE TAPSE (M-mode): 1.7 cm LEFT ATRIUM             Index       RIGHT ATRIUM           Index LA diam:        3.50 cm 1.40 cm/m  RA Area:     14.30 cm LA Vol (A2C):   43.4 ml 17.32 ml/m RA Volume:   32.20 ml  12.85 ml/m LA Vol (A4C):   58.8 ml 23.46 ml/m LA Biplane Vol: 52.2 ml 20.83 ml/m  AORTIC VALVE LVOT Vmax:   83.13 cm/s LVOT Vmean:  59.433 cm/s LVOT VTI:    0.154 m  AORTA Ao Root diam: 3.30 cm Ao Asc diam:  3.30 cm  SHUNTS Systemic VTI:  0.15 m Systemic Diam: 2.00 cm Rachelle Hora Croitoru MD Electronically signed by Thurmon Fair MD Signature Date/Time: 12/01/2020/4:02:10 PM    Final

## 2020-12-07 ENCOUNTER — Telehealth: Payer: Self-pay | Admitting: Cardiology

## 2020-12-07 LAB — GLUCOSE, CAPILLARY
Glucose-Capillary: 158 mg/dL — ABNORMAL HIGH (ref 70–99)
Glucose-Capillary: 252 mg/dL — ABNORMAL HIGH (ref 70–99)
Glucose-Capillary: 191 mg/dL — ABNORMAL HIGH (ref 70–99)
Glucose-Capillary: 158 mg/dL — ABNORMAL HIGH (ref 70–99)

## 2020-12-07 LAB — CBC
HCT: 47.5 % (ref 39.0–52.0)
Hemoglobin: 16.1 g/dL (ref 13.0–17.0)
MCH: 30 pg (ref 26.0–34.0)
MCHC: 33.9 g/dL (ref 30.0–36.0)
MCV: 88.5 fL (ref 80.0–100.0)
Platelets: 231 10*3/uL (ref 150–400)
RBC: 5.37 MIL/uL (ref 4.22–5.81)
RDW: 13.3 % (ref 11.5–15.5)
WBC: 16.4 10*3/uL — ABNORMAL HIGH (ref 4.0–10.5)
nRBC: 0 % (ref 0.0–0.2)

## 2020-12-07 LAB — C-REACTIVE PROTEIN: CRP: 0.6 mg/dL (ref <1.0)

## 2020-12-07 LAB — COMPREHENSIVE METABOLIC PANEL
ALT: 107 U/L — ABNORMAL HIGH (ref 0–44)
AST: 41 U/L (ref 15–41)
Albumin: 3.1 g/dL — ABNORMAL LOW (ref 3.5–5.0)
Alkaline Phosphatase: 74 U/L (ref 38–126)
Anion gap: 10 (ref 5–15)
BUN: 38 mg/dL — ABNORMAL HIGH (ref 6–20)
CO2: 23 mmol/L (ref 22–32)
Calcium: 9.5 mg/dL (ref 8.9–10.3)
Chloride: 102 mmol/L (ref 98–111)
Creatinine, Ser: 1.14 mg/dL (ref 0.61–1.24)
GFR, Estimated: >60 mL/min (ref >60)
Glucose, Bld: 170 mg/dL — ABNORMAL HIGH (ref 70–99)
Potassium: 4.8 mmol/L (ref 3.5–5.1)
Sodium: 135 mmol/L (ref 135–145)
Total Bilirubin: 1 mg/dL (ref 0.3–1.2)
Total Protein: 6.6 g/dL (ref 6.5–8.1)

## 2020-12-07 LAB — D-DIMER, QUANTITATIVE: D-Dimer, Quant: 0.54 ug/mL-FEU — ABNORMAL HIGH (ref 0.00–0.50)

## 2020-12-07 MED ORDER — INFLUENZA VAC SPLIT QUAD 0.5 ML IM SUSY
0.5000 mL | PREFILLED_SYRINGE | INTRAMUSCULAR | Status: AC
  Administered 2020-12-08: 08:00:00 0.5 mL via INTRAMUSCULAR
  Filled 2020-12-07: qty 0.5

## 2020-12-07 MED ORDER — METHYLPREDNISOLONE SODIUM SUCC 40 MG IJ SOLR
40.0000 mg | Freq: Two times a day (BID) | INTRAMUSCULAR | Status: DC
  Administered 2020-12-08: 01:00:00 40 mg via INTRAVENOUS
  Filled 2020-12-07: qty 1

## 2020-12-07 MED ORDER — FUROSEMIDE 10 MG/ML IJ SOLN
20.0000 mg | Freq: Once | INTRAMUSCULAR | Status: AC
  Administered 2020-12-07: 18:00:00 20 mg via INTRAVENOUS
  Filled 2020-12-07: qty 2

## 2020-12-07 MED ORDER — METOPROLOL TARTRATE 50 MG PO TABS
50.0000 mg | ORAL_TABLET | Freq: Two times a day (BID) | ORAL | Status: DC
  Administered 2020-12-07 – 2020-12-08 (×2): 50 mg via ORAL
  Filled 2020-12-07 (×2): qty 1

## 2020-12-07 NOTE — Progress Notes (Signed)
PROGRESS NOTE                                                                                                                                                                                                             Patient Demographics:    Alejandro Bowen, is a 59 y.o. male, DOB - 02/02/1961, UEA:[id number]  Outpatient Primary MD for the patient is Barbie BannerWilson, Fred H, MD   Admit date - 11/27/2020   LOS - 9  Chief Complaint  Patient presents with  . Covid Positive  . Chest Pain       Brief Narrative: Patient is a 59 y.o. male with PMHx of HTN, chronic anxiety, obesity-who tested positive for COVID-19 on 11/29-admitted for acute hypoxic respiratory failure due to COVID-19 pneumonia-further hospital course complicated by atrial fibrillation with RVR.  See below for further details.  COVID-19 vaccinated status: Unvaccinated  Significant Events: 11/29>> COVID-19 positive (results available in care everywhere) 12/4>> [facility] due to [facility]  Significant studies: 12/3>> chest x-ray: Multifocal pneumonia 12/4>> CTA chest: Bilateral multifocal pneumonia, no PE. 12/7>> Echo: EF 60-65%  COVID-19 medications: Steroids: 12/3>> Remdesivir: 12/3>> 12/8 Baricitinib: 12/4>>  Antibiotics: Rocephin: 12/4>> 12/8 Zithromax: 12/4>> 12/8  Microbiology data: 12/4 >>blood culture: No growth  Procedures: None  Consults: None  DVT prophylaxis: apixaban (ELIQUIS) tablet 5 mg     Subjective:   Lying comfortably in bed-thinks his shortness of breath is better.  Was titrated down to 4-5 L of oxygen.   Assessment  & Plan :   Acute Hypoxic Resp Failure due to Covid 19 Viral pneumonia: Had severe hypoxemia but has made gradual improvement today-titrated down to room air today-however requiring of approximately 5-6 L with ambulation.  Also no evidence of volume overload-repeat one dose of IV Lasix today.  Taper  steroids down further-continue baricitinib.  Suspect that if clinical improvement continues-he will probably go home in the next few days on home O2.  Fever: afebrile O2 requirements:  SpO2: 93 % O2 Flow Rate (L/min): 3 L/min   COVID-19 Labs: Recent Labs    12/05/20 0250 12/06/20 0531 12/07/20 0801  DDIMER 0.57* 0.54* 0.54*  CRP 0.5 0.6 0.6       Component Value Date/Time   BNP 195.9 (H) 12/01/2020 0057    No results for input(s): PROCALCITON in the  last 168 hours.  No results found for: SARSCOV2NAA    Prone/Incentive Spirometry: encouraged patient to lie prone for 3-4 hours at a time for a total of 16 hours a day, and to encourage incentive spirometry use 3-4/hour  Transaminitis: Mild-stable for close follow-up for now.  Etiology likely due to COVID-19 related inflammation.  A. fib with RVR: Remains in atrial fibrillation-heart rate relatively stable at rest-but does increase with ambulation-increase metoprolol to 50 mg twice daily-remains on Eliquis.  Discussed with Dr. Macarthur Critchley will arrange for outpatient follow-up and possible cardioversion if he remains in persistent atrial fibrillation.    Hyperkalemia: Resolved-stop Lokelma-continue to hold lisinopril  HTN: BP controlled-continue beta-blocker-lisinopril remains on hold.  DM-2 (A1c 8.3 on 12/6) with uncontrolled hyperglycemia due to steroid use: CBGs remain on the higher side-CBGs this morning better controlled compared to the past few days-steroid dose has been tapered down-continue Lantus 25 units, 6 units of NovoLog with meals and SSI-follow and readjust.  Suspect that as steroids continue to get tapered down-CBGs will improve further.   Recent Labs    12/06/20 2054 12/07/20 0810 12/07/20 1157  GLUCAP 293* 158* 252*    Depression/anxiety: Appears stable-continue Celexa.  GI prophylaxis: PPI  ABG: No results found for: PHART, PCO2ART, PO2ART, HCO3, TCO2, ACIDBASEDEF, O2SAT  Vent Settings:  N/A  Condition - Extremely Guarded  Family Communication  :  Spouse [name] [phone]) updated over the phone on 12/13  Code Status :  Full Code  Diet :  Diet Order            Diet heart healthy/carb modified Room service appropriate? Yes; Fluid consistency: Thin  Diet effective now                  Disposition Plan  :   Status is: Inpatient  Remains inpatient appropriate because:Inpatient level of care appropriate due to severity of illness   Dispo:  Patient From: Home  Planned Disposition: Home with Health Care Svc  Expected discharge date: 12/08/2020  Medically stable for discharge: No  Barriers to discharge: Hypoxia requiring O2 supplementation  Antimicorbials  :    Anti-infectives (From admission, onward)   Start     Dose/Rate Route Frequency Ordered Stop   11/29/20 1000  remdesivir 100 mg in sodium chloride 0.9 % 100 mL IVPB       "Followed by" Linked Group Details   100 mg 200 mL/hr over 30 Minutes Intravenous Daily 11/28/20 0155 12/02/20 0915   11/28/20 1100  cefTRIAXone (ROCEPHIN) 2 g in sodium chloride 0.9 % 100 mL IVPB        2 g 200 mL/hr over 30 Minutes Intravenous Every 24 hours 11/28/20 1059 12/02/20 1220   11/28/20 0830  azithromycin (ZITHROMAX) 500 mg in sodium chloride 0.9 % 250 mL IVPB        500 mg 250 mL/hr over 60 Minutes Intravenous Daily 11/28/20 0808 12/02/20 1115   11/28/20 0830  cefTRIAXone (ROCEPHIN) 2 g in sodium chloride 0.9 % 100 mL IVPB  Status:  Discontinued        2 g 200 mL/hr over 30 Minutes Intravenous Daily 11/28/20 0808 11/28/20 0811   11/28/20 0300  remdesivir 200 mg in sodium chloride 0.9% 250 mL IVPB       "Followed by" Linked Group Details   200 mg 580 mL/hr over 30 Minutes Intravenous Once 11/28/20 0155 11/28/20 0415      Inpatient Medications  Scheduled Meds: . apixaban  5 mg Oral BID  .  vitamin C  500 mg Oral Daily  . baricitinib  4 mg Oral Daily  . cholecalciferol  1,000 Units Oral Daily  . citalopram  20  mg Oral Daily  . [START ON 12/08/2020] influenza vac split quadrivalent PF  0.5 mL Intramuscular Tomorrow-1000  . insulin aspart  0-20 Units Subcutaneous TID WC  . insulin aspart  0-5 Units Subcutaneous QHS  . insulin aspart  6 Units Subcutaneous TID WC  . insulin detemir  35 Units Subcutaneous Daily  . Ipratropium-Albuterol  1 puff Inhalation Q6H  . linagliptin  5 mg Oral Daily  . [START ON 12/08/2020] methylPREDNISolone (SOLU-MEDROL) injection  40 mg Intravenous Q12H  . metoprolol tartrate  50 mg Oral BID  . multivitamin with minerals  1 tablet Oral Daily  . pantoprazole  40 mg Oral Daily  . polyethylene glycol  17 g Oral Daily  . zinc sulfate  220 mg Oral Daily   Continuous Infusions: PRN Meds:.bisacodyl, guaiFENesin-dextromethorphan, LORazepam, melatonin, sodium chloride   Time Spent in minutes  25  See all Orders from today for further details   Jeoffrey Massed M.D on 12/07/2020 at 3:11 PM  To page go to [email or web address] - use universal password  [facility] -  Office  [phone]    Objective:   Vitals:   12/07/20 0621 12/07/20 0653 12/07/20 0810 12/07/20 1422  BP:   107/73 114/77  Pulse:   92 98  Resp:   18 17  Temp:   97.9 F (36.6 C) 98.2 F (36.8 C)  TempSrc:   Oral Oral  SpO2:  94% 90% 93%  Weight: 101.3 kg     Height:        Wt Readings from Last 3 Encounters:  12/07/20 101.3 kg  11/14/16 122.5 kg     Intake/Output Summary (Last 24 hours) at 12/07/2020 1511 Last data filed at 12/07/2020 1422 Gross per 24 hour  Intake 480 ml  Output --  Net 480 ml     Physical Exam Gen Exam:Alert awake-not in any distress HEENT:atraumatic, normocephalic Chest: B/L clear to auscultation anteriorly CVS:S1S2 regular Abdomen:soft non tender, non distended Extremities:no edema Neurology: Non focal Skin: no rash   Data Review:    CBC Recent Labs  Lab 12/01/20 0057 12/02/20 0147 12/03/20 0125 12/04/20 0130 12/05/20 0250 12/06/20 0531  12/07/20 0223  WBC 11.9* 13.5* 17.5* 20.6* 16.8* 18.2* 16.4*  HGB 14.2 14.9 15.5 15.1 16.0 16.2 16.1  HCT 40.4 42.7 45.7 44.5 45.4 48.0 47.5  PLT 279 296 345 380 318 336 231  MCV 88.6 88.2 89.3 88.1 88.5 89.4 88.5  MCH 31.1 30.8 30.3 29.9 31.2 30.2 30.0  MCHC 35.1 34.9 33.9 33.9 35.2 33.8 33.9  RDW 13.1 13.2 12.9 13.1 13.2 13.3 13.3  LYMPHSABS 0.5* 0.6* 0.7  --   --   --   --   MONOABS 1.0 0.9 1.0  --   --   --   --   EOSABS 0.0 0.0 0.0  --   --   --   --   BASOSABS 0.1 0.1 0.1  --   --   --   --     Chemistries  Recent Labs  Lab 12/03/20 0125 12/04/20 0130 12/05/20 0250 12/06/20 0531 12/07/20 0801  NA 137 138 139 137 135  K 5.4* 4.2 4.7 5.0 4.8  CL 104 105 106 102 102  CO2 22 22 21* 24 23  GLUCOSE 250* 184* 133* 137* 170*  BUN 32* 34* 37* 33* 38*  CREATININE 1.14 1.00 1.11 1.05 1.14  CALCIUM 9.2 9.0 9.3 9.3 9.5  AST 56* 38 36 38 41  ALT 110* 96* 92* 95* 107*  ALKPHOS 67 64 66 66 74  BILITOT 1.2 0.8 0.8 1.0 1.0   ------------------------------------------------------------------------------------------------------------------ No results for input(s): CHOL, HDL, LDLCALC, TRIG, CHOLHDL, LDLDIRECT in the last 72 hours.  Lab Results  Component Value Date   HGBA1C 8.3 (H) 11/30/2020   ------------------------------------------------------------------------------------------------------------------ No results for input(s): TSH, T4TOTAL, T3FREE, THYROIDAB in the last 72 hours.  Invalid input(s): FREET3 ------------------------------------------------------------------------------------------------------------------ No results for input(s): VITAMINB12, FOLATE, FERRITIN, TIBC, IRON, RETICCTPCT in the last 72 hours.  Coagulation profile No results for input(s): INR, PROTIME in the last 168 hours.  Recent Labs    12/06/20 0531 12/07/20 0801  DDIMER 0.54* 0.54*    Cardiac Enzymes No results for input(s): CKMB, TROPONINI, MYOGLOBIN in the last 168 hours.  Invalid  input(s): CK ------------------------------------------------------------------------------------------------------------------    Component Value Date/Time   BNP 195.9 (H) 12/01/2020 0057    Micro Results Recent Results (from the past 240 hour(s))  Blood Culture (routine x 2)     Status: None   Collection Time: 11/28/20  2:40 AM   Specimen: BLOOD  Result Value Ref Range Status   Specimen Description BLOOD LEFT ANTECUBITAL  Final   Special Requests   Final    BOTTLES DRAWN AEROBIC AND ANAEROBIC Blood Culture adequate volume   Culture   Final    NO GROWTH 5 DAYS Performed at [facility], [address] [place], [place] [address]    Report Status 12/03/2020 FINAL  Final  Blood Culture (routine x 2)     Status: None   Collection Time: 11/28/20  2:40 AM   Specimen: BLOOD LEFT HAND  Result Value Ref Range Status   Specimen Description BLOOD LEFT HAND  Final   Special Requests   Final    BOTTLES DRAWN AEROBIC AND ANAEROBIC Blood Culture results may not be optimal due to an inadequate volume of blood received in culture bottles   Culture   Final    NO GROWTH 5 DAYS Performed at [facility], [address], [place], [place] [address]    Report Status 12/03/2020 FINAL  Final    Radiology Reports CT Angio Chest PE W/Cm &/Or Wo Cm  Result Date: 11/28/2020 CLINICAL DATA:  Chest pain. EXAM: CT ANGIOGRAPHY CHEST WITH CONTRAST TECHNIQUE: Multidetector CT imaging of the chest was performed using the standard protocol during bolus administration of intravenous contrast. Multiplanar CT image reconstructions and MIPs were obtained to evaluate the vascular anatomy. CONTRAST:  75mL OMNIPAQUE IOHEXOL 350 MG/ML SOLN COMPARISON:  None. FINDINGS: Cardiovascular: Some of the most peripheral segmental and subsegmental pulmonary artery branches cannot be definitively characterize due to mild patient breathing motion artifact, however, there is no pulmonary embolism identified within the  main, lobar or central segmental pulmonary arteries bilaterally. No pericardial effusion. No thoracic aortic aneurysm or evidence of aortic dissection. Mediastinum/Nodes: No mass or enlarged lymph nodes are seen within the mediastinum. Esophagus is unremarkable. Trachea is unremarkable. Lungs/Pleura: Patchy ground-glass airspace opacities are seen throughout both lungs, RIGHT slightly greater than LEFT, consistent with multifocal pneumonia. No pleural effusion or pneumothorax. Upper Abdomen: Liver is low in density suggesting fatty infiltration, incompletely imaged. Limited images of the upper abdomen are otherwise unremarkable. Musculoskeletal: No acute or suspicious osseous finding. Mild degenerative spurring within the thoracic spine. Review of the MIP images confirms the above findings. IMPRESSION: 1. Bilateral  multifocal pneumonia. 2. No pulmonary embolism seen. 3. Fatty infiltration of the liver. Electronically Signed   By: Bary Richard M.D.   On: 11/28/2020 07:56   DG Chest Portable 1 View  Result Date: 11/27/2020 CLINICAL DATA:  COVID-19 positive, chest pain EXAM: PORTABLE CHEST 1 VIEW COMPARISON:  Radiograph 11/14/2016 FINDINGS: Heterogeneous mixed consolidative and interstitial opacities present the mid to lower lungs in a slight peripheral predominance compatible reported imaging features of COVID 19 pneumonia. No pneumothorax. No effusion. The cardiomediastinal contours are unremarkable for portable technique. No acute osseous or soft tissue abnormality. IMPRESSION: Appearance compatible with a multifocal pneumonia in the setting of COVID 19. Electronically Signed   By: Kreg Shropshire M.D.   On: 11/27/2020 18:26   ECHOCARDIOGRAM COMPLETE  Result Date: 12/01/2020    ECHOCARDIOGRAM REPORT   Patient Name:   BRION SOSSAMON Date of Exam: 12/01/2020 Medical Rec #:  [id number]    Height:       78.0 in Accession #:    [id number]   Weight:       255.7 lb Date of Birth:  [birth year]   BSA:          2.506 m  Patient Age:    58 years     BP:           123/88 mmHg Patient Gender: M            HR:           80 bpm. Exam Location:  Inpatient Procedure: 2D Echo, Cardiac Doppler and Color Doppler Indications:    Atrial Fibrillation 427.31 / I48.91  History:        Patient has no prior history of Echocardiogram examinations.                 Risk Factors:Hypertension.  Sonographer:    Eulah Pont RDCS Referring Phys: [id number] Deno Lunger SHALHOUB IMPRESSIONS  1. Left ventricular ejection fraction, by estimation, is 60 to 65%. The left ventricle has normal function. The left ventricle has no regional wall motion abnormalities. Left ventricular diastolic function could not be evaluated.  2. Right ventricular systolic function is normal. The right ventricular size is normal.  3. The mitral valve is normal in structure. No evidence of mitral valve regurgitation. No evidence of mitral stenosis.  4. The aortic valve is normal in structure. Aortic valve regurgitation is not visualized. No aortic stenosis is present.  5. The inferior vena cava is normal in size with greater than 50% respiratory variability, suggesting right atrial pressure of 3 mmHg. FINDINGS  Left Ventricle: Left ventricular ejection fraction, by estimation, is 60 to 65%. The left ventricle has normal function. The left ventricle has no regional wall motion abnormalities. The left ventricular internal cavity size was normal in size. There is  no left ventricular hypertrophy. Left ventricular diastolic function could not be evaluated due to atrial fibrillation. Left ventricular diastolic function could not be evaluated. Right Ventricle: The right ventricular size is normal. No increase in right ventricular wall thickness. Right ventricular systolic function is normal. Left Atrium: Left atrial size was normal in size. Right Atrium: Right atrial size was normal in size. Pericardium: There is no evidence of pericardial effusion. Mitral Valve: The mitral valve is normal in  structure. No evidence of mitral valve regurgitation. No evidence of mitral valve stenosis. Tricuspid Valve: The tricuspid valve is normal in structure. Tricuspid valve regurgitation is not demonstrated. No evidence of tricuspid stenosis. Aortic Valve: The aortic valve is  normal in structure. Aortic valve regurgitation is not visualized. No aortic stenosis is present. Pulmonic Valve: The pulmonic valve was normal in structure. Pulmonic valve regurgitation is not visualized. No evidence of pulmonic stenosis. Aorta: The aortic root is normal in size and structure. Venous: The inferior vena cava is normal in size with greater than 50% respiratory variability, suggesting right atrial pressure of 3 mmHg. IAS/Shunts: No atrial level shunt detected by color flow Doppler.  LEFT VENTRICLE PLAX 2D LVIDd:         4.90 cm LVIDs:         3.40 cm LV PW:         1.00 cm LV IVS:        1.20 cm LVOT diam:     2.00 cm LV SV:         48 LV SV Index:   19 LVOT Area:     3.14 cm  RIGHT VENTRICLE TAPSE (M-mode): 1.7 cm LEFT ATRIUM             Index       RIGHT ATRIUM           Index LA diam:        3.50 cm 1.40 cm/m  RA Area:     14.30 cm LA Vol (A2C):   43.4 ml 17.32 ml/m RA Volume:   32.20 ml  12.85 ml/m LA Vol (A4C):   58.8 ml 23.46 ml/m LA Biplane Vol: 52.2 ml 20.83 ml/m  AORTIC VALVE LVOT Vmax:   83.13 cm/s LVOT Vmean:  59.433 cm/s LVOT VTI:    0.154 m  AORTA Ao Root diam: 3.30 cm Ao Asc diam:  3.30 cm  SHUNTS Systemic VTI:  0.15 m Systemic Diam: 2.00 cm Rachelle Hora Croitoru MD Electronically signed by Thurmon Fair MD Signature Date/Time: 12/01/2020/4:02:10 PM    Final

## 2020-12-07 NOTE — Progress Notes (Signed)
Physical Therapy Treatment Patient Details Name: Alejandro Bowen MRN: [id number] DOB: April 20, 1961 Today's Date: 12/07/2020    History of Present Illness 58yo male c/o SOB, chest pain, and cough all progressive in nature, hypoxic in the ED. PE negative, found to be Covid positive. PMH anxiety    PT Comments    Pt demonstrates independence with in room mobility. Able to walk in hallway for first time today due to decreased O2 needs. SpO2 93% at rest on 3L. Initiated gait on 4L. Desat to 69%. O2 increased to 6L with SpO2 77%. Pt asymptomatic, able to hold conversation. Good cadence with gait distance of 400' supervision without AD. Max HR 148. Pt returned to room. SpO2 improved to 91% after 3-minute seated rest on 3L.    Follow Up Recommendations  No PT follow up     Equipment Recommendations  None recommended by PT    Recommendations for Other Services       Precautions / Restrictions Precautions Precautions: Other (comment) Precaution Comments: watch sats/HR, Covid +    Mobility  Bed Mobility Overal bed mobility: Independent                Transfers Overall transfer level: Independent Equipment used: None                Ambulation/Gait Ambulation/Gait assistance: Supervision Gait Distance (Feet): 400 Feet Assistive device: None Gait Pattern/deviations: WFL(Within Functional Limits) Gait velocity: WFL Gait velocity interpretation: >2.62 ft/sec, indicative of community ambulatory General Gait Details: Supervision for safety/line management   Stairs             Wheelchair Mobility    Modified Rankin (Stroke Patients Only)       Balance Overall balance assessment: Independent                                          Cognition Arousal/Alertness: Awake/alert Behavior During Therapy: WFL for tasks assessed/performed Overall Cognitive Status: Within Functional Limits for tasks assessed                                         Exercises      General Comments General comments (skin integrity, edema, etc.): SpO2 93% at rest on 3L. Resting HR 110. Initially amb on 4L with desat to 69%. O2 increased to 6L with SpO2 77%.  Pt asymptomatic. Good cadence. Very mild SOB. Able to hold conversation. Max HR 148.      Pertinent Vitals/Pain Pain Assessment: No/denies pain    Home Living                      Prior Function            PT Goals (current goals can now be found in the care plan section) Acute Rehab PT Goals Patient Stated Goal: home Progress towards PT goals: Progressing toward goals    Frequency    Min 3X/week      PT Plan Current plan remains appropriate    Co-evaluation              AM-PAC PT "6 Clicks" Mobility   Outcome Measure  Help needed turning from your back to your side while in a flat bed without using bedrails?: None Help needed moving from lying on  your back to sitting on the side of a flat bed without using bedrails?: None Help needed moving to and from a bed to a chair (including a wheelchair)?: None Help needed standing up from a chair using your arms (e.g., wheelchair or bedside chair)?: None Help needed to walk in hospital room?: A Little Help needed climbing 3-5 steps with a railing? : A Little 6 Click Score: 22    End of Session Equipment Utilized During Treatment: Oxygen Activity Tolerance: Patient tolerated treatment well Patient left: with call bell/phone within reach;in chair Nurse Communication: Mobility status PT Visit Diagnosis: Difficulty in walking, not elsewhere classified (R26.2)     Time: 8280-0349 PT Time Calculation (min) (ACUTE ONLY): 24 min  Charges:  $Gait Training: 23-37 mins                     [name], PT  Office # [phone] Pager [phone]    [name] 12/07/2020, 11:08 AM

## 2020-12-07 NOTE — Progress Notes (Signed)
PT Progress Note  SATURATION QUALIFICATIONS: (This note is used to comply with regulatory documentation for home oxygen)  Patient Saturations on Room Air at Rest = 86%  Patient Saturations on Room Air while Ambulating = N/A  Patient Saturations on 6 Liters of oxygen while Ambulating = 77%  Please briefly explain why patient needs home oxygen:  Pt unable to maintain SpO2 > 88% without supplemental O2.  [name], PT  Office # [phone] Pager [phone]

## 2020-12-08 LAB — CBC
HCT: 46.7 % (ref 39.0–52.0)
Hemoglobin: 15.8 g/dL (ref 13.0–17.0)
MCH: 30.1 pg (ref 26.0–34.0)
MCHC: 33.8 g/dL (ref 30.0–36.0)
MCV: 89 fL (ref 80.0–100.0)
Platelets: 263 10*3/uL (ref 150–400)
RBC: 5.25 MIL/uL (ref 4.22–5.81)
RDW: 13.4 % (ref 11.5–15.5)
WBC: 16.3 10*3/uL — ABNORMAL HIGH (ref 4.0–10.5)
nRBC: 0 % (ref 0.0–0.2)

## 2020-12-08 LAB — COMPREHENSIVE METABOLIC PANEL
ALT: 92 U/L — ABNORMAL HIGH (ref 0–44)
AST: 37 U/L (ref 15–41)
Albumin: 2.9 g/dL — ABNORMAL LOW (ref 3.5–5.0)
Alkaline Phosphatase: 65 U/L (ref 38–126)
Anion gap: 14 (ref 5–15)
BUN: 41 mg/dL — ABNORMAL HIGH (ref 6–20)
CO2: 20 mmol/L — ABNORMAL LOW (ref 22–32)
Calcium: 9.3 mg/dL (ref 8.9–10.3)
Chloride: 101 mmol/L (ref 98–111)
Creatinine, Ser: 1.14 mg/dL (ref 0.61–1.24)
GFR, Estimated: >60 mL/min (ref >60)
Glucose, Bld: 144 mg/dL — ABNORMAL HIGH (ref 70–99)
Potassium: 4.8 mmol/L (ref 3.5–5.1)
Sodium: 135 mmol/L (ref 135–145)
Total Bilirubin: 1 mg/dL (ref 0.3–1.2)
Total Protein: 6 g/dL — ABNORMAL LOW (ref 6.5–8.1)

## 2020-12-08 LAB — C-REACTIVE PROTEIN: CRP: <0.5 mg/dL (ref <1.0)

## 2020-12-08 LAB — GLUCOSE, CAPILLARY
Glucose-Capillary: 146 mg/dL — ABNORMAL HIGH (ref 70–99)
Glucose-Capillary: 211 mg/dL — ABNORMAL HIGH (ref 70–99)
Glucose-Capillary: 152 mg/dL — ABNORMAL HIGH (ref 70–99)
Glucose-Capillary: 116 mg/dL — ABNORMAL HIGH (ref 70–99)
Glucose-Capillary: 160 mg/dL — ABNORMAL HIGH (ref 70–99)

## 2020-12-08 LAB — D-DIMER, QUANTITATIVE: D-Dimer, Quant: 0.48 ug/mL-FEU (ref 0.00–0.50)

## 2020-12-08 MED ORDER — METOPROLOL TARTRATE 25 MG PO TABS
25.0000 mg | ORAL_TABLET | Freq: Once | ORAL | Status: AC
  Administered 2020-12-08: 11:00:00 25 mg via ORAL
  Filled 2020-12-08: qty 1

## 2020-12-08 MED ORDER — METOPROLOL TARTRATE 100 MG PO TABS
100.0000 mg | ORAL_TABLET | Freq: Two times a day (BID) | ORAL | Status: DC
  Administered 2020-12-08: 21:00:00 100 mg via ORAL
  Filled 2020-12-08 (×2): qty 1

## 2020-12-08 MED ORDER — PREDNISONE 20 MG PO TABS
40.0000 mg | ORAL_TABLET | Freq: Every day | ORAL | Status: DC
  Administered 2020-12-09: 09:00:00 40 mg via ORAL
  Filled 2020-12-08: qty 2

## 2020-12-08 NOTE — Progress Notes (Signed)
Physical Therapy Treatment Patient Details Name: Alejandro Bowen MRN: [id number] DOB: 22-Jul-1961 Today's Date: 12/08/2020    History of Present Illness 58yo male c/o SOB, chest pain, and cough all progressive in nature, hypoxic in the ED. PE negative, found to be Covid positive. PMH anxiety    PT Comments    Pt progressing well and demonstrates independent transfers and gait with supervision.  Pt was educated on need for O2 at this time to provide proper oxygenation to organs despite him feeling well and able to converse while walking.  Will continue to benefit from PT while hospitalized to assess O2 needs and advance as able.   SATURATION QUALIFICATIONS: (This note is used to comply with regulatory documentation for home oxygen)  Patient Saturations on Room Air at Rest = 95%  Patient Saturations on Room Air while Ambulating = 81%  Patient Saturations on 2 Liters of oxygen while Ambulating = 85%  Please briefly explain why patient needs home oxygen:In order to maintain proper oxygenation levels.     Follow Up Recommendations  No PT follow up     Equipment Recommendations  None recommended by PT    Recommendations for Other Services       Precautions / Restrictions Precautions Precautions: Other (comment) Precaution Comments: Watch sats    Mobility  Bed Mobility Overal bed mobility: Independent                Transfers Overall transfer level: Independent               General transfer comment: Demonstrated sit to stand safely with line management  Ambulation/Gait Ambulation/Gait assistance: Supervision Gait Distance (Feet): 200 Feet (200'x2) Assistive device: None Gait Pattern/deviations: WFL(Within Functional Limits)     General Gait Details: Cued for decreased speed for O2 saturation maintenance.   Stairs             Wheelchair Mobility    Modified Rankin (Stroke Patients Only)       Balance Overall balance assessment:  Independent                                          Cognition Arousal/Alertness: Awake/alert Behavior During Therapy: WFL for tasks assessed/performed Overall Cognitive Status: Within Functional Limits for tasks assessed                                 General Comments: extremely pleasant and cooperative and motivated      Exercises      General Comments General comments (skin integrity, edema, etc.): Pt was on RA at rest with sats 95%.  Attempted ambulation at controlled speed on RA but sats dropped to 81% -pt denies DOE and still able to converse.  Took seated rest break and then ambulated again on 2 LPM O2 this time.  Sats dropped to 85-86% with good pleth. but again pt denies DOE and able to converse well.  With rest pt quickly (<30 sec) returns to 95% on RA.  Pt reports that he feels fine and doesn't feel that he needs O2.  Discussed importance of oxygen saturation for organ function.  HR stable.  Discussed walking program for return home - frequent short walks as opposed [place] getting [place] and [place]. Discussed gradual build up of activity with monitoring of symptoms and  pt reports he has a pulse oximeter.      Pertinent Vitals/Pain Pain Assessment: No/denies pain    Home Living                      Prior Function            PT Goals (current goals can now be found in the care plan section) Acute Rehab PT Goals Patient Stated Goal: home PT Goal Formulation: With patient Time For Goal Achievement: 12/14/20 Potential to Achieve Goals: Good Progress towards PT goals: Progressing toward goals    Frequency    Min 3X/week      PT Plan Current plan remains appropriate    Co-evaluation              AM-PAC PT "6 Clicks" Mobility   Outcome Measure  Help needed turning from your back to your side while in a flat bed without using bedrails?: None Help needed moving from lying on your back to sitting on the  side of a flat bed without using bedrails?: None Help needed moving to and from a bed to a chair (including a wheelchair)?: None Help needed standing up from a chair using your arms (e.g., wheelchair or bedside chair)?: None Help needed to walk in hospital room?: A Little Help needed climbing 3-5 steps with a railing? : A Little 6 Click Score: 22    End of Session Equipment Utilized During Treatment: Oxygen Activity Tolerance: Patient tolerated treatment well Patient left: with call bell/phone within reach;in chair Nurse Communication: Mobility status;Other (comment) (O2 sats.) PT Visit Diagnosis: Difficulty in walking, not elsewhere classified (R26.2)     Time: 4656-8127 PT Time Calculation (min) (ACUTE ONLY): 23 min  Charges:  $Gait Training: 23-37 mins                     Anise Salvo, PT Acute Rehab Services Pager [phone] [facility] [phone]     Rayetta Humphrey 12/08/2020, 5:53 PM

## 2020-12-08 NOTE — Progress Notes (Signed)
PROGRESS NOTE                                                                                                                                                                                                             Patient Demographics:    Alejandro Bowen, is a 59 y.o. male, DOB - 03/12/1961, SNK:[id number]  Outpatient Primary MD for the patient is [name], MD   Admit date - 11/27/2020   LOS - 10  Chief Complaint  Patient presents with  . Covid Positive  . Chest Pain       Brief Narrative: Patient is a 59 y.o. male with PMHx of HTN, chronic anxiety, obesity-who tested positive for COVID-19 on 11/29-admitted for acute hypoxic respiratory failure due to COVID-19 pneumonia-further hospital course complicated by atrial fibrillation with RVR.  See below for further details.  COVID-19 vaccinated status: Unvaccinated  Significant Events: 11/29>> COVID-19 positive (results available in care everywhere) 12/4>> Admit to [facility] for hypoxia due to COVID-19  Significant studies: 12/3>> chest x-ray: Multifocal pneumonia 12/4>> CTA chest: Bilateral multifocal pneumonia, no PE. 12/7>> Echo: EF 60-65%  COVID-19 medications: Steroids: 12/3>> Remdesivir: 12/3>> 12/8 Baricitinib: 12/4>>  Antibiotics: Rocephin: 12/4>> 12/8 Zithromax: 12/4>> 12/8  Microbiology data: 12/4 >>blood culture: No growth  Procedures: None  Consults: None  DVT prophylaxis: apixaban (ELIQUIS) tablet 5 mg     Subjective:   Lying comfortably in bed-thinks his shortness of breath is better.  Was titrated down to 4-5 L of oxygen.   Assessment  & Plan :   Acute Hypoxic Resp Failure due to Covid 19 Viral pneumonia: Had severe hypoxemia on initial presentation but gradually improving-requiring minimal oxygen/room air on admission-but continues to have exertional dyspnea-requiring around 7 L with ambulation.  Suspect not yet ready for  discharge-repeat ambulatory O2 saturations today-continue to taper steroids further-remains on baricitinib.  No evidence of volume overload-does not require diuretics.  Fever: afebrile O2 requirements:  SpO2: 93 % O2 Flow Rate (L/min): 3 L/min   COVID-19 Labs: Recent Labs    12/06/20 0531 12/07/20 0801 12/08/20 0540  DDIMER 0.54* 0.54* 0.48  CRP 0.6 0.6 <0.5       Component Value Date/Time   BNP 195.9 (H) 12/01/2020 0057    No results for input(s): PROCALCITON in the last 168 hours.  No results found for: SARSCOV2NAA  Prone/Incentive Spirometry: encouraged patient to lie prone for 3-4 hours at a time for a total of 16 hours a day, and to encourage incentive spirometry use 3-4/hour  Transaminitis: Mild-stable for close follow-up for now.  Etiology likely due to COVID-19 related inflammation.  A. fib with RVR: Remains in atrial fibrillation-heart rate relatively stable at rest-but does at times increased to the 120s with ambulation.  Increase metoprolol 200 mg twice daily-remains on Eliquis. Discussed with Dr. Jacinto HalimGanji on 12/13-he will arrange for outpatient follow-up and possible cardioversion if he remains in persistent atrial fibrillation.    Hyperkalemia: Resolved-stop Lokelma-continue to hold lisinopril  HTN: BP controlled-continue beta-blocker-lisinopril remains on hold.  DM-2 (A1c 8.3 on 12/6) with uncontrolled hyperglycemia due to steroid use: CBGs remain on the higher side-and gradually improving-steroid dosage being tapered down-for now cautiously continue with Lantus 25 units daily, 6 units of NovoLog with meals and SSI-watch closely as probably will require less insulin over the next 24 hours or so.  We will plan on discharging patient home on oral hypoglycemic agents and SSI while he is on steroids.    Recent Labs    12/07/20 2048 12/08/20 0722 12/08/20 1155  GLUCAP 158* 146* 211*    Depression/anxiety: Appears stable-continue Celexa.  GI prophylaxis:  PPI  ABG: No results found for: PHART, PCO2ART, PO2ART, HCO3, TCO2, ACIDBASEDEF, O2SAT  Vent Settings: N/A  Condition - Extremely Guarded  Family Communication  :  Spouse [name](Gwen [phone]) updated over the phone on 12/14  Code Status :  Full Code  Diet :  Diet Order            Diet heart healthy/carb modified Room service appropriate? Yes; Fluid consistency: Thin  Diet effective now                  Disposition Plan  :   Status is: Inpatient  Remains inpatient appropriate because:Inpatient level of care appropriate due to severity of illness   Dispo:  Patient From: Home  Planned Disposition: Home with Health Care Svc  Expected discharge date: 12/09/2020  Medically stable for discharge: No  Barriers to discharge: Hypoxia requiring O2 supplementation  Antimicorbials  :    Anti-infectives (From admission, onward)   Start     Dose/Rate Route Frequency Ordered Stop   11/29/20 1000  remdesivir 100 mg in sodium chloride 0.9 % 100 mL IVPB       "Followed by" Linked Group Details   100 mg 200 mL/hr over 30 Minutes Intravenous Daily 11/28/20 0155 12/02/20 0915   11/28/20 1100  cefTRIAXone (ROCEPHIN) 2 g in sodium chloride 0.9 % 100 mL IVPB        2 g 200 mL/hr over 30 Minutes Intravenous Every 24 hours 11/28/20 1059 12/02/20 1220   11/28/20 0830  azithromycin (ZITHROMAX) 500 mg in sodium chloride 0.9 % 250 mL IVPB        500 mg 250 mL/hr over 60 Minutes Intravenous Daily 11/28/20 0808 12/02/20 1115   11/28/20 0830  cefTRIAXone (ROCEPHIN) 2 g in sodium chloride 0.9 % 100 mL IVPB  Status:  Discontinued        2 g 200 mL/hr over 30 Minutes Intravenous Daily 11/28/20 0808 11/28/20 0811   11/28/20 0300  remdesivir 200 mg in sodium chloride 0.9% 250 mL IVPB       "Followed by" Linked Group Details   200 mg 580 mL/hr over 30 Minutes Intravenous Once 11/28/20 0155 11/28/20 0415      Inpatient Medications  Scheduled Meds: . apixaban  5 mg Oral BID  . vitamin C  500 mg  Oral Daily  . baricitinib  4 mg Oral Daily  . cholecalciferol  1,000 Units Oral Daily  . citalopram  20 mg Oral Daily  . insulin aspart  0-20 Units Subcutaneous TID WC  . insulin aspart  0-5 Units Subcutaneous QHS  . insulin aspart  6 Units Subcutaneous TID WC  . insulin detemir  35 Units Subcutaneous Daily  . Ipratropium-Albuterol  1 puff Inhalation Q6H  . linagliptin  5 mg Oral Daily  . metoprolol tartrate  100 mg Oral BID  . multivitamin with minerals  1 tablet Oral Daily  . pantoprazole  40 mg Oral Daily  . polyethylene glycol  17 g Oral Daily  . [START ON 12/09/2020] predniSONE  40 mg Oral Q breakfast  . zinc sulfate  220 mg Oral Daily   Continuous Infusions: PRN Meds:.bisacodyl, guaiFENesin-dextromethorphan, LORazepam, melatonin, sodium chloride   Time Spent in minutes  25  See all Orders from today for further details   Jeoffrey Massed M.D on 12/08/2020 at 1:57 PM  To page go to [email or web address] - use universal password  [facility] -  Office  [phone]    Objective:   Vitals:   12/07/20 2025 12/08/20 0510 12/08/20 0800 12/08/20 1340  BP: 108/88 104/77  108/72  Pulse: 85 85 78 86  Resp: 20 20 19 16   Temp: 98.2 F (36.8 C) 98.2 F (36.8 C)  98.7 F (37.1 C)  TempSrc: Oral Oral  Axillary  SpO2: 96% 90% 93% 93%  Weight:  102.3 kg    Height:        Wt Readings from Last 3 Encounters:  12/08/20 102.3 kg  11/14/16 122.5 kg     Intake/Output Summary (Last 24 hours) at 12/08/2020 1357 Last data filed at 12/08/2020 0826 Gross per 24 hour  Intake 480 ml  Output 100 ml  Net 380 ml     Physical Exam Gen Exam:Alert awake-not in any distress HEENT:atraumatic, normocephalic Chest: B/L clear to auscultation anteriorly CVS:S1S2 regular Abdomen:soft non tender, non distended Extremities:no edema Neurology: Non focal Skin: no rash   Data Review:    CBC Recent Labs  Lab 12/02/20 0147 12/03/20 0125 12/04/20 0130 12/05/20 0250 12/06/20 0531  12/07/20 0223 12/08/20 0540  WBC 13.5* 17.5* 20.6* 16.8* 18.2* 16.4* 16.3*  HGB 14.9 15.5 15.1 16.0 16.2 16.1 15.8  HCT 42.7 45.7 44.5 45.4 48.0 47.5 46.7  PLT 296 345 380 318 336 231 263  MCV 88.2 89.3 88.1 88.5 89.4 88.5 89.0  MCH 30.8 30.3 29.9 31.2 30.2 30.0 30.1  MCHC 34.9 33.9 33.9 35.2 33.8 33.9 33.8  RDW 13.2 12.9 13.1 13.2 13.3 13.3 13.4  LYMPHSABS 0.6* 0.7  --   --   --   --   --   MONOABS 0.9 1.0  --   --   --   --   --   EOSABS 0.0 0.0  --   --   --   --   --   BASOSABS 0.1 0.1  --   --   --   --   --     Chemistries  Recent Labs  Lab 12/04/20 0130 12/05/20 0250 12/06/20 0531 12/07/20 0801 12/08/20 0540  NA 138 139 137 135 135  K 4.2 4.7 5.0 4.8 4.8  CL 105 106 102 102 101  CO2 22 21* 24 23 20*  GLUCOSE 184* 133* 137* 170* 144*  BUN 34* 37* 33* 38* 41*  CREATININE 1.00 1.11 1.05 1.14 1.14  CALCIUM 9.0 9.3 9.3 9.5 9.3  AST 38 36 38 41 37  ALT 96* 92* 95* 107* 92*  ALKPHOS 64 66 66 74 65  BILITOT 0.8 0.8 1.0 1.0 1.0   ------------------------------------------------------------------------------------------------------------------ No results for input(s): CHOL, HDL, LDLCALC, TRIG, CHOLHDL, LDLDIRECT in the last 72 hours.  Lab Results  Component Value Date   HGBA1C 8.3 (H) 11/30/2020   ------------------------------------------------------------------------------------------------------------------ No results for input(s): TSH, T4TOTAL, T3FREE, THYROIDAB in the last 72 hours.  Invalid input(s): FREET3 ------------------------------------------------------------------------------------------------------------------ No results for input(s): VITAMINB12, FOLATE, FERRITIN, TIBC, IRON, RETICCTPCT in the last 72 hours.  Coagulation profile No results for input(s): INR, PROTIME in the last 168 hours.  Recent Labs    12/07/20 0801 12/08/20 0540  DDIMER 0.54* 0.48    Cardiac Enzymes No results for input(s): CKMB, TROPONINI, MYOGLOBIN in the last 168  hours.  Invalid input(s): CK ------------------------------------------------------------------------------------------------------------------    Component Value Date/Time   BNP 195.9 (H) 12/01/2020 0057    Micro Results No results found for this or any previous visit (from the past 240 hour(s)).  Radiology Reports CT Angio Chest PE W/Cm &/Or Wo Cm  Result Date: 11/28/2020 CLINICAL DATA:  Chest pain. EXAM: CT ANGIOGRAPHY CHEST WITH CONTRAST TECHNIQUE: Multidetector CT imaging of the chest was performed using the standard protocol during bolus administration of intravenous contrast. Multiplanar CT image reconstructions and MIPs were obtained to evaluate the vascular anatomy. CONTRAST:  75mL OMNIPAQUE IOHEXOL 350 MG/ML SOLN COMPARISON:  None. FINDINGS: Cardiovascular: Some of the most peripheral segmental and subsegmental pulmonary artery branches cannot be definitively characterize due to mild patient breathing motion artifact, however, there is no pulmonary embolism identified within the main, lobar or central segmental pulmonary arteries bilaterally. No pericardial effusion. No thoracic aortic aneurysm or evidence of aortic dissection. Mediastinum/Nodes: No mass or enlarged lymph nodes are seen within the mediastinum. Esophagus is unremarkable. Trachea is unremarkable. Lungs/Pleura: Patchy ground-glass airspace opacities are seen throughout both lungs, RIGHT slightly greater than LEFT, consistent with multifocal pneumonia. No pleural effusion or pneumothorax. Upper Abdomen: Liver is low in density suggesting fatty infiltration, incompletely imaged. Limited images of the upper abdomen are otherwise unremarkable. Musculoskeletal: No acute or suspicious osseous finding. Mild degenerative spurring within the thoracic spine. Review of the MIP images confirms the above findings. IMPRESSION: 1. Bilateral multifocal pneumonia. 2. No pulmonary embolism seen. 3. Fatty infiltration of the liver. Electronically  Signed   By: Bary Richard M.D.   On: 11/28/2020 07:56   DG Chest Portable 1 View  Result Date: 11/27/2020 CLINICAL DATA:  COVID-19 positive, chest pain EXAM: PORTABLE CHEST 1 VIEW COMPARISON:  Radiograph 11/14/2016 FINDINGS: Heterogeneous mixed consolidative and interstitial opacities present the mid to lower lungs in a slight peripheral predominance compatible reported imaging features of COVID 19 pneumonia. No pneumothorax. No effusion. The cardiomediastinal contours are unremarkable for portable technique. No acute osseous or soft tissue abnormality. IMPRESSION: Appearance compatible with a multifocal pneumonia in the setting of COVID 19. Electronically Signed   By: Kreg Shropshire M.D.   On: 11/27/2020 18:26   ECHOCARDIOGRAM COMPLETE  Result Date: 12/01/2020    ECHOCARDIOGRAM REPORT   Patient Name:   ARMARI FUSSELL Date of Exam: 12/01/2020 Medical Rec #:  [id number]    Height:       78.0 in Accession #:    [id number]   Weight:       255.7 lb Date of Birth:  1961-11-22  BSA:          2.506 m Patient Age:    58 years     BP:           123/88 mmHg Patient Gender: M            HR:           80 bpm. Exam Location:  Inpatient Procedure: 2D Echo, Cardiac Doppler and Color Doppler Indications:    Atrial Fibrillation 427.31 / I48.91  History:        Patient has no prior history of Echocardiogram examinations.                 Risk Factors:Hypertension.  Sonographer:    Eulah Pont RDCS Referring Phys: [id number] Deno Lunger SHALHOUB IMPRESSIONS  1. Left ventricular ejection fraction, by estimation, is 60 to 65%. The left ventricle has normal function. The left ventricle has no regional wall motion abnormalities. Left ventricular diastolic function could not be evaluated.  2. Right ventricular systolic function is normal. The right ventricular size is normal.  3. The mitral valve is normal in structure. No evidence of mitral valve regurgitation. No evidence of mitral stenosis.  4. The aortic valve is normal in structure.  Aortic valve regurgitation is not visualized. No aortic stenosis is present.  5. The inferior vena cava is normal in size with greater than 50% respiratory variability, suggesting right atrial pressure of 3 mmHg. FINDINGS  Left Ventricle: Left ventricular ejection fraction, by estimation, is 60 to 65%. The left ventricle has normal function. The left ventricle has no regional wall motion abnormalities. The left ventricular internal cavity size was normal in size. There is  no left ventricular hypertrophy. Left ventricular diastolic function could not be evaluated due to atrial fibrillation. Left ventricular diastolic function could not be evaluated. Right Ventricle: The right ventricular size is normal. No increase in right ventricular wall thickness. Right ventricular systolic function is normal. Left Atrium: Left atrial size was normal in size. Right Atrium: Right atrial size was normal in size. Pericardium: There is no evidence of pericardial effusion. Mitral Valve: The mitral valve is normal in structure. No evidence of mitral valve regurgitation. No evidence of mitral valve stenosis. Tricuspid Valve: The tricuspid valve is normal in structure. Tricuspid valve regurgitation is not demonstrated. No evidence of tricuspid stenosis. Aortic Valve: The aortic valve is normal in structure. Aortic valve regurgitation is not visualized. No aortic stenosis is present. Pulmonic Valve: The pulmonic valve was normal in structure. Pulmonic valve regurgitation is not visualized. No evidence of pulmonic stenosis. Aorta: The aortic root is normal in size and structure. Venous: The inferior vena cava is normal in size with greater than 50% respiratory variability, suggesting right atrial pressure of 3 mmHg. IAS/Shunts: No atrial level shunt detected by color flow Doppler.  LEFT VENTRICLE PLAX 2D LVIDd:         4.90 cm LVIDs:         3.40 cm LV PW:         1.00 cm LV IVS:        1.20 cm LVOT diam:     2.00 cm LV SV:         48 LV  SV Index:   19 LVOT Area:     3.14 cm  RIGHT VENTRICLE TAPSE (M-mode): 1.7 cm LEFT ATRIUM             Index       RIGHT ATRIUM  Index LA diam:        3.50 cm 1.40 cm/m  RA Area:     14.30 cm LA Vol (A2C):   43.4 ml 17.32 ml/m RA Volume:   32.20 ml  12.85 ml/m LA Vol (A4C):   58.8 ml 23.46 ml/m LA Biplane Vol: 52.2 ml 20.83 ml/m  AORTIC VALVE LVOT Vmax:   83.13 cm/s LVOT Vmean:  59.433 cm/s LVOT VTI:    0.154 m  AORTA Ao Root diam: 3.30 cm Ao Asc diam:  3.30 cm  SHUNTS Systemic VTI:  0.15 m Systemic Diam: 2.00 cm Rachelle Hora Croitoru MD Electronically signed by Thurmon Fair MD Signature Date/Time: 12/01/2020/4:02:10 PM    Final

## 2020-12-08 NOTE — Plan of Care (Signed)
°  Problem: Education: Goal: Knowledge of risk factors and measures for prevention of condition will improve Outcome: Progressing   Problem: Coping: Goal: Psychosocial and spiritual needs will be supported Outcome: Progressing   Problem: Coping: Goal: Level of anxiety will decrease Outcome: Progressing   Problem: Safety: Goal: Ability to remain free from injury will improve Outcome: Progressing

## 2020-12-08 NOTE — TOC Initial Note (Signed)
Transition of Care [facility]) - Initial/Assessment Note    Patient Details  Name: Alejandro Bowen MRN: [id number] Date of Birth: Sep 01, 1961  Transition of Care [facility] CM/SW Contact:    Lawerance Sabal, RN Phone Number: 12/08/2020, 1:38 PM  Clinical Narrative:                Spoke to patient over the phone. He states that he plans to DC to home, with wife at time of discharge. Patient will need home oxygen, current order for 5L. He states that he walked today only needing 2 liters, waiting on documentation to support that. If so, will ask MD if order can be for 3L or less so that patient can get sent home w portable O2 concentrator instead of multiple tanks. Patient states that wife will be able to provide transportation home. Patient will DC on Eliquis. Discussed his copay and deductible, as listed on insurance card. He understands that he may be DC'd with meds in hand provided through the [facility] pharmacy and if so they will be using the 30 day manufacturer card. He understands that if he goes home w prescriptions that he will need a coupon from CM, prior to DC.      Expected Discharge Plan: Home/Self Care Barriers to Discharge: Continued Medical Work up   Patient Goals and CMS Choice Patient states their goals for this hospitalization and ongoing recovery are:: to go home      Expected Discharge Plan and Services Expected Discharge Plan: Home/Self Care   Discharge Planning Services: CM Consult,Medication Assistance Post Acute Care Choice: Durable Medical Equipment Living arrangements for the past 2 months: Single Family Home                 DME Arranged: Oxygen                    Prior Living Arrangements/Services Living arrangements for the past 2 months: Single Family Home Lives with:: Spouse                   Activities of Daily Living Home Assistive Devices/Equipment: None ADL Screening (condition at time of admission) Patient's cognitive ability adequate to safely  complete daily activities?: Yes Is the patient deaf or have difficulty hearing?: No Does the patient have difficulty seeing, even when wearing glasses/contacts?: No Does the patient have difficulty concentrating, remembering, or making decisions?: No Patient able to express need for assistance with ADLs?: No Does the patient have difficulty dressing or bathing?: No Independently performs ADLs?: Yes (appropriate for developmental age) Does the patient have difficulty walking or climbing stairs?: No Weakness of Legs: Both Weakness of Arms/Hands: None  Permission Sought/Granted                  Emotional Assessment              Admission diagnosis:  Acute hypoxemic respiratory failure (HCC) [J96.01] Pneumonia due to COVID-19 virus [U07.1, J12.82] COVID-19 [U07.1] Patient Active Problem List   Diagnosis Date Noted  . Essential hypertension 11/29/2020  . Pneumonia due to COVID-19 virus 11/28/2020   PCP:  [name], MD Pharmacy:   [facility] [phone] - [place], [place] - [address] [place] [address] Phone: [phone] Fax: [phone]     Social Determinants of Health (SDOH) Interventions    Readmission Risk Interventions No flowsheet data found.

## 2020-12-09 ENCOUNTER — Telehealth: Payer: Self-pay

## 2020-12-09 DIAGNOSIS — E119 Type 2 diabetes mellitus without complications: Secondary | ICD-10-CM

## 2020-12-09 LAB — COMPREHENSIVE METABOLIC PANEL
ALT: 102 U/L — ABNORMAL HIGH (ref 0–44)
AST: 44 U/L — ABNORMAL HIGH (ref 15–41)
Albumin: 3 g/dL — ABNORMAL LOW (ref 3.5–5.0)
Alkaline Phosphatase: 67 U/L (ref 38–126)
Anion gap: 8 (ref 5–15)
BUN: 41 mg/dL — ABNORMAL HIGH (ref 6–20)
CO2: 26 mmol/L (ref 22–32)
Calcium: 9.5 mg/dL (ref 8.9–10.3)
Chloride: 101 mmol/L (ref 98–111)
Creatinine, Ser: 1.22 mg/dL (ref 0.61–1.24)
GFR, Estimated: >60 mL/min (ref >60)
Glucose, Bld: 76 mg/dL (ref 70–99)
Potassium: 4.5 mmol/L (ref 3.5–5.1)
Sodium: 135 mmol/L (ref 135–145)
Total Bilirubin: 1 mg/dL (ref 0.3–1.2)
Total Protein: 6.2 g/dL — ABNORMAL LOW (ref 6.5–8.1)

## 2020-12-09 LAB — CBC
HCT: 48 % (ref 39.0–52.0)
Hemoglobin: 16.2 g/dL (ref 13.0–17.0)
MCH: 30.1 pg (ref 26.0–34.0)
MCHC: 33.8 g/dL (ref 30.0–36.0)
MCV: 89.2 fL (ref 80.0–100.0)
Platelets: 243 10*3/uL (ref 150–400)
RBC: 5.38 MIL/uL (ref 4.22–5.81)
RDW: 13.4 % (ref 11.5–15.5)
WBC: 20.7 10*3/uL — ABNORMAL HIGH (ref 4.0–10.5)
nRBC: 0 % (ref 0.0–0.2)

## 2020-12-09 LAB — D-DIMER, QUANTITATIVE: D-Dimer, Quant: 0.52 ug/mL-FEU — ABNORMAL HIGH (ref 0.00–0.50)

## 2020-12-09 LAB — GLUCOSE, CAPILLARY
Glucose-Capillary: 62 mg/dL — ABNORMAL LOW (ref 70–99)
Glucose-Capillary: 84 mg/dL (ref 70–99)

## 2020-12-09 LAB — C-REACTIVE PROTEIN: CRP: 0.6 mg/dL (ref <1.0)

## 2020-12-09 MED ORDER — PREDNISONE 10 MG PO TABS: ORAL_TABLET | ORAL | 0 refills | Status: DC

## 2020-12-09 MED ORDER — BLOOD GLUCOSE METER KIT: PACK | 0 refills | Status: AC

## 2020-12-09 MED ORDER — METOPROLOL TARTRATE 100 MG PO TABS: 100.0000 mg | ORAL_TABLET | Freq: Two times a day (BID) | ORAL | 0 refills | Status: DC

## 2020-12-09 MED ORDER — FREESTYLE TEST VI STRP: ORAL_STRIP | 0 refills | Status: AC

## 2020-12-09 MED ORDER — METFORMIN HCL 500 MG PO TABS: 500.0000 mg | ORAL_TABLET | Freq: Two times a day (BID) | ORAL | 0 refills | Status: DC

## 2020-12-09 MED ORDER — ALBUTEROL SULFATE HFA 108 (90 BASE) MCG/ACT IN AERS: 2.0000 | INHALATION_SPRAY | Freq: Four times a day (QID) | RESPIRATORY_TRACT | 0 refills | Status: DC | PRN

## 2020-12-09 MED ORDER — APIXABAN 5 MG PO TABS: 5.0000 mg | ORAL_TABLET | Freq: Two times a day (BID) | ORAL | 0 refills | Status: DC

## 2020-12-09 MED ORDER — FREESTYLE LANCETS MISC: 0 refills | Status: AC

## 2020-12-09 MED ORDER — INSULIN PEN NEEDLE 32G X 4 MM MISC: 1.0000 | 0 refills | Status: DC | PRN

## 2020-12-09 MED ORDER — INSULIN DETEMIR 100 UNIT/ML ~~LOC~~ SOLN
28.0000 [IU] | Freq: Every day | SUBCUTANEOUS | Status: DC
  Filled 2020-12-09: qty 0.28

## 2020-12-09 MED ORDER — PANTOPRAZOLE SODIUM 40 MG PO TBEC: 40.0000 mg | DELAYED_RELEASE_TABLET | Freq: Every day | ORAL | 0 refills | Status: DC

## 2020-12-09 MED ORDER — INSULIN ASPART 100 UNIT/ML FLEXPEN: PEN_INJECTOR | SUBCUTANEOUS | 0 refills | Status: DC

## 2020-12-09 MED ORDER — INSULIN ASPART 100 UNIT/ML ~~LOC~~ SOLN: 4.0000 [IU] | Freq: Three times a day (TID) | SUBCUTANEOUS | Status: DC

## 2020-12-09 NOTE — Telephone Encounter (Signed)
12/24/20 @ 2:45 with Dr Odis Hollingshead

## 2020-12-09 NOTE — Telephone Encounter (Signed)
Attempted to do TOC. First phone number did not work. The other one had a different mans name in the answering machine so I did not leave a vm. AD/S

## 2020-12-09 NOTE — Discharge Summary (Addendum)
PATIENT DETAILS Name: Alejandro Bowen Age: 59 y.o. Sex: male Date of Birth: 04/21/1961 MRN: [id number]. Admitting Physician: Kayleen Memos, DO DZH:[id number], Jama Flavors, MD  Admit Date: 11/27/2020 Discharge date: 12/09/2020  Recommendations for Outpatient Follow-up:  1. Follow up with PCP in 1-2 weeks 2. Please obtain CMP/CBC in one week 3. Repeat Chest Xray in 4-6 week 4. Please reassess at next visit whether patient still requires home O2 5. Please ensure follow-up with cardiology-Dr. Wynona Luna need cardioversion if still in persistent A. fib.  Admitted From:  Home  Disposition: [place]: No  Equipment/Devices: Home O2-mostly with ambulation around 3 L.  Discharge Condition: Stable  CODE STATUS: FULL CODE  Diet recommendation:  Diet Order            Diet - low sodium heart healthy           Diet Carb Modified           Diet heart healthy/carb modified Room service appropriate? Yes; Fluid consistency: Thin  Diet effective now                  Brief Summary: Brief Narrative: Patient is a 59 y.o. male with PMHx of HTN, chronic anxiety, obesity-who tested positive for COVID-19 on 11/29-admitted for acute hypoxic respiratory failure due to COVID-19 pneumonia-further hospital course complicated by atrial fibrillation with RVR.  See below for further details.  COVID-19 vaccinated status: Unvaccinated  Significant Events: 11/29>> COVID-19 positive (results available in care everywhere) 12/4>> Admit to [facility] for hypoxia due to COVID-19  Significant studies: 12/3>> chest x-ray: Multifocal pneumonia 12/4>> CTA chest: Bilateral multifocal pneumonia, no PE. 12/7>> Echo: EF 60-65%  COVID-19 medications: Steroids: 12/3>> Remdesivir: 12/3>> 12/8 Baricitinib: 12/4>>12/15  Antibiotics: Rocephin: 12/4>> 12/8 Zithromax: 12/4>> 12/8  Microbiology data: 12/4 >>blood culture: No growth  Procedures: None  Consults: None  Brief Hospital  Course: Acute Hypoxic Resp Failure due to Covid 19 Viral pneumonia: Had severe hypoxemia on initial presentation but gradually has had significant clinical improvement over the past few days-on room air at rest-requires around 3 L of oxygen with ambulation.  Treated with a combination of steroid/Remdesivir and baricitinib.  He will continue with a few more days of tapering steroids on discharge.  He will be discharged on home O2-his primary care practitioner will need to determine whether he still requires home O2 at next visit.  Please do a two-view chest x-ray-in 4 to 6 weeks-if continues to have significant infiltrates-consider pulmonology evaluation.  COVID-19 Labs:  Recent Labs    12/07/20 0801 12/08/20 0540 12/09/20 0121  DDIMER 0.54* 0.48 0.52*  CRP 0.6 <0.5 0.6    No results found for: SARSCOV2NAA   Transaminitis: Mild-stable for close follow-up for now.  Etiology likely due to COVID-19 related inflammation.  A. fib with RVR: Remains in atrial fibrillation-heart rate relatively stable at rest-but does at times increased to the 120s at times with ambulation.  Metoprolol has been increased to 100 mg twice daily-remains on Eliquis.  Case discussed with Dr. Einar Gip on 12/13-he will arrange for outpatient follow-up next week-and consider cardioversion if he is still in atrial fibrillation.  Hyperkalemia: Resolved-resolved  HTN: BP controlled-continue beta-blocker-lisinopril remains on hold.  Reassess at next visit  DM-2 (A1c 8.3 on 12/6) with uncontrolled hyperglycemia due to steroid use:  Hypoglycemic this morning his steroid dosage has been significantly tapered down-does not require Lantus.  Will place on Metformin on discharge.While on steroids-patient will be on sliding scale insulin.  Further optimization deferred to PCP.   Discharge Diagnoses:  Active Problems:   Pneumonia due to COVID-19 virus   Essential hypertension   Discharge Instructions:    Person Under  Monitoring Name: Alejandro Bowen  Location: [address] [place] [address]   Infection Prevention Recommendations for Individuals Confirmed to have, or Being Evaluated for, 2019 Novel Coronavirus (COVID-19) Infection Who Receive Care at Home  Individuals who are confirmed to have, or are being evaluated for, COVID-19 should follow the prevention steps below until a healthcare provider or local or state health department says they can return to normal activities.  Stay home except to get medical care You should restrict activities outside your home, except for getting medical care. Do not go to work, school, or public areas, and do not use public transportation or taxis.  Call ahead before visiting your doctor Before your medical appointment, call the healthcare provider and tell them that you have, or are being evaluated for, COVID-19 infection. This will help the healthcare provider's office take steps to keep other people from getting infected. Ask your healthcare provider to call the local or state health department.  Monitor your symptoms Seek prompt medical attention if your illness is worsening (e.g., difficulty breathing). Before going to your medical appointment, call the healthcare provider and tell them that you have, or are being evaluated for, COVID-19 infection. Ask your healthcare provider to call the local or state health department.  Wear a facemask You should wear a facemask that covers your nose and mouth when you are in the same room with other people and when you visit a healthcare provider. People who live with or visit you should also wear a facemask while they are in the same room with you.  Separate yourself from other people in your home As much as possible, you should stay in a different room from other people in your home. Also, you should use a separate bathroom, if available.  Avoid sharing household items You should not share dishes, drinking glasses,  cups, eating utensils, towels, bedding, or other items with other people in your home. After using these items, you should wash them thoroughly with soap and water.  Cover your coughs and sneezes Cover your mouth and nose with a tissue when you cough or sneeze, or you can cough or sneeze into your sleeve. Throw used tissues in a lined trash can, and immediately wash your hands with soap and water for at least 20 seconds or use an alcohol-based hand rub.  Wash your [facility] your hands often and thoroughly with soap and water for at least 20 seconds. You can use an alcohol-based hand sanitizer if soap and water are not available and if your hands are not visibly dirty. Avoid touching your eyes, nose, and mouth with unwashed hands.   Prevention Steps for Caregivers and Household Members of Individuals Confirmed to have, or Being Evaluated for, COVID-19 Infection Being Cared for in the Home  If you live with, or provide care at home for, a person confirmed to have, or being evaluated for, COVID-19 infection please follow these guidelines to prevent infection:  Follow healthcare provider's instructions Make sure that you understand and can help the patient follow any healthcare provider instructions for all care.  Provide for the patient's basic needs You should help the patient with basic needs in the home and provide support for getting groceries, prescriptions, and other personal needs.  Monitor the patient's symptoms If they are getting  sicker, call his or her medical provider and tell them that the patient has, or is being evaluated for, COVID-19 infection. This will help the healthcare provider's office take steps to keep other people from getting infected. Ask the healthcare provider to call the local or state health department.  Limit the number of people who have contact with the patient  If possible, have only one caregiver for the patient.  Other household members should  stay in another home or place of residence. If this is not possible, they should stay  in another room, or be separated from the patient as much as possible. Use a separate bathroom, if available.  Restrict visitors who do not have an essential need to be in the home.  Keep older adults, very young children, and other sick people away from the patient Keep older adults, very young children, and those who have compromised immune systems or chronic health conditions away from the patient. This includes people with chronic heart, lung, or kidney conditions, diabetes, and cancer.  Ensure good ventilation Make sure that shared spaces in the home have good air flow, such as from an air conditioner or an opened window, weather permitting.  Wash your hands often  Wash your hands often and thoroughly with soap and water for at least 20 seconds. You can use an alcohol based hand sanitizer if soap and water are not available and if your hands are not visibly dirty.  Avoid touching your eyes, nose, and mouth with unwashed hands.  Use disposable paper towels to dry your hands. If not available, use dedicated cloth towels and replace them when they become wet.  Wear a facemask and gloves  Wear a disposable facemask at all times in the room and gloves when you touch or have contact with the patient's blood, body fluids, and/or secretions or excretions, such as sweat, saliva, sputum, nasal mucus, vomit, urine, or feces.  Ensure the mask fits over your nose and mouth tightly, and do not touch it during use.  Throw out disposable facemasks and gloves after using them. Do not reuse.  Wash your hands immediately after removing your facemask and gloves.  If your personal clothing becomes contaminated, carefully remove clothing and launder. Wash your hands after handling contaminated clothing.  Place all used disposable facemasks, gloves, and other waste in a lined container before disposing them with other  household waste.  Remove gloves and wash your hands immediately after handling these items.  Do not share dishes, glasses, or other household items with the patient  Avoid sharing household items. You should not share dishes, drinking glasses, cups, eating utensils, towels, bedding, or other items with a patient who is confirmed to have, or being evaluated for, COVID-19 infection.  After the person uses these items, you should wash them thoroughly with soap and water.  Wash laundry thoroughly  Immediately remove and wash clothes or bedding that have blood, body fluids, and/or secretions or excretions, such as sweat, saliva, sputum, nasal mucus, vomit, urine, or feces, on them.  Wear gloves when handling laundry from the patient.  Read and follow directions on labels of laundry or clothing items and detergent. In general, wash and dry with the warmest temperatures recommended on the label.  Clean all areas the individual has used often  Clean all touchable surfaces, such as counters, tabletops, doorknobs, bathroom fixtures, toilets, phones, keyboards, tablets, and bedside tables, every day. Also, clean any surfaces that may have blood, body fluids, and/or secretions or  excretions on them.  Wear gloves when cleaning surfaces the patient has come in contact with.  Use a diluted bleach solution (e.g., dilute bleach with 1 part bleach and 10 parts water) or a household disinfectant with a label that says EPA-registered for coronaviruses. To make a bleach solution at home, add 1 tablespoon of bleach to 1 quart (4 cups) of water. For a larger supply, add  cup of bleach to 1 gallon (16 cups) of water.  Read labels of cleaning products and follow recommendations provided on product labels. Labels contain instructions for safe and effective use of the cleaning product including precautions you should take when applying the product, such as wearing gloves or eye protection and making sure you have  good ventilation during use of the product.  Remove gloves and wash hands immediately after cleaning.  Monitor yourself for signs and symptoms of illness Caregivers and household members are considered close contacts, should monitor their health, and will be asked to limit movement outside of the home to the extent possible. Follow the monitoring steps for close contacts listed on the symptom monitoring form.   ? If you have additional questions, contact your local health department or call the epidemiologist on call at [phone] (available 24/7). ? This guidance is subject to change. For the most up-to-date guidance from CDC, please refer to their website: YouBlogs.pl    Activity:  As tolerated with Full fall precautions use walker/cane & assistance as needed  Discharge Instructions    Call MD for:  difficulty breathing, headache or visual disturbances   Complete by: As directed    Diet - low sodium heart healthy   Complete by: As directed    Diet Carb Modified   Complete by: As directed    Discharge instructions   Complete by: As directed    Follow with Primary MD  Christain Sacramento, MD in 1-2 weeks  Please get a complete blood count and chemistry panel checked by your Primary MD at your next visit, and again as instructed by your Primary MD.  Get Medicines reviewed and adjusted: Please take all your medications with you for your next visit with your Primary MD  Laboratory/radiological data: Please request your Primary MD to go over all hospital tests and procedure/radiological results at the follow up, please ask your Primary MD to get all Hospital records sent to his/her office.  In some cases, they will be blood work, cultures and biopsy results pending at the time of your discharge. Please request that your primary care M.D. follows up on these results.  Also Note the following: If you experience worsening  of your admission symptoms, develop shortness of breath, life threatening emergency, suicidal or homicidal thoughts you must seek medical attention immediately by calling 911 or calling your MD immediately  if symptoms less severe.  You must read complete instructions/literature along with all the possible adverse reactions/side effects for all the Medicines you take and that have been prescribed to you. Take any new Medicines after you have completely understood and accpet all the possible adverse reactions/side effects.   Do not drive when taking Pain medications or sleeping medications (Benzodaizepines)  Do not take more than prescribed Pain, Sleep and Anxiety Medications. It is not advisable to combine anxiety,sleep and pain medications without talking with your primary care practitioner  Special Instructions: If you have smoked or chewed Tobacco  in the last 2 yrs please stop smoking, stop any regular Alcohol  and or any Recreational  drug use.  Wear Seat belts while driving.  Please note: You were cared for by a hospitalist during your hospital stay. Once you are discharged, your primary care physician will handle any further medical issues. Please note that NO REFILLS for any discharge medications will be authorized once you are discharged, as it is imperative that you return to your primary care physician (or establish a relationship with a primary care physician if you do not have one) for your post hospital discharge needs so that they can reassess your need for medications and monitor your lab values.   1.)  21 days of isolation from 11/29 2.)  If you develop worsening shortness of breath-please seek immediate medical attention 3.)  You will get a call from cardiology office-please keep that appointment-if you do not hear from them-please give them a call. 4.)  You have newly diagnosed diabetes-please check your sugars multiple times a day-keep a recording of these readings and take it to  your appointment with your PCP. 5.)  Take sliding scale insulin while on steroids-after you finish your course of steroids-you do not require any further sliding scale insulin. 6.)  Please ask your primary care practitioner to repeat a two-view chest x-ray in 4 to 6 weeks to document resolution of pneumonia 7.)  Please ask your primary care practitioner to reassess at next visit whether you still require home O2.   Increase activity slowly   Complete by: As directed      Allergies as of 12/09/2020      Reactions   Alprazolam Other (See Comments)   depression   Penicillins Hives      Medication List    STOP taking these medications   carvedilol 25 MG tablet Commonly known as: COREG   ibuprofen 200 MG tablet Commonly known as: ADVIL   lisinopril 10 MG tablet Commonly known as: ZESTRIL     TAKE these medications   albuterol 108 (90 Base) MCG/ACT inhaler Commonly known as: VENTOLIN HFA Inhale 2 puffs into the lungs every 6 (six) hours as needed for wheezing or shortness of breath.   apixaban 5 MG Tabs tablet Commonly known as: ELIQUIS Take 1 tablet (5 mg total) by mouth 2 (two) times daily.   blood glucose meter kit and supplies Dispense based on patient and insurance preference. Use up to four times daily as directed. (FOR ICD-10 E10.9, E11.9).   citalopram 20 MG tablet Commonly known as: CELEXA Take 20 mg by mouth daily.   freestyle lancets Use as instructed   FREESTYLE TEST STRIPS test strip Generic drug: glucose blood Use as instructed   insulin aspart 100 UNIT/ML FlexPen Commonly known as: NOVOLOG 0-20 Units, Subcutaneous, 3 times daily with meals CBG < 70: Implement Hypoglycemia measures, call MD CBG 70 - 120: 0 units CBG 121 - 150: 3 units CBG 151 - 200: 4 units CBG 201 - 250: 7 units CBG 251 - 300: 11 units CBG 301 - 350: 15 units CBG 351 - 400: 20 units CBG > 400: call MD   Insulin Pen Needle 32G X 4 MM Misc 1 each by Does not apply route as needed.    metFORMIN 500 MG tablet Commonly known as: Glucophage Take 1 tablet (500 mg total) by mouth 2 (two) times daily with a meal.   metoprolol tartrate 100 MG tablet Commonly known as: LOPRESSOR Take 1 tablet (100 mg total) by mouth 2 (two) times daily.   pantoprazole 40 MG tablet Commonly known as: PROTONIX  Take 1 tablet (40 mg total) by mouth daily. Start taking on: December 10, 2020   predniSONE 10 MG tablet Commonly known as: DELTASONE Take 30 mg daily for 1 day, 20 mg daily for 1 days,10 mg daily for 1 day, then stop            Durable Medical Equipment  (From admission, onward)         Start     Ordered   12/09/20 0848  For home use only DME oxygen  Once       Comments: 1-2 L at rest 3 L with ambulation  Question Answer Comment  Length of Need 6 Months   Mode or (Route) Nasal cannula   Liters per Minute 3   Frequency Continuous (stationary and portable oxygen unit needed)   Oxygen delivery system Gas      12/09/20 0847          Follow-up Information    Christain Sacramento, MD. Schedule an appointment as soon as possible for a visit in 1 week(s).   Specialty: Family Medicine Contact information: [address] [place] [address] [phone]        Adrian Prows, MD Follow up.   Specialty: Cardiology Why: office will call-if you do not hear from them-please give them a call Contact information: [address] [place] [address] [phone]        [facility] Follow up.   Why: Home Oxygen supplier Contact information: [address] [place] [address] [phone]              Allergies  Allergen Reactions  . Alprazolam Other (See Comments)    depression  . Penicillins Hives    Other Procedures/Studies: CT Angio Chest PE W/Cm &/Or Wo Cm  Result Date: 11/28/2020 CLINICAL DATA:  Chest pain. EXAM: CT ANGIOGRAPHY CHEST WITH CONTRAST TECHNIQUE: Multidetector CT imaging of the chest was performed using the  standard protocol during bolus administration of intravenous contrast. Multiplanar CT image reconstructions and MIPs were obtained to evaluate the vascular anatomy. CONTRAST:  45m OMNIPAQUE IOHEXOL 350 MG/ML SOLN COMPARISON:  None. FINDINGS: Cardiovascular: Some of the most peripheral segmental and subsegmental pulmonary artery branches cannot be definitively characterize due to mild patient breathing motion artifact, however, there is no pulmonary embolism identified within the main, lobar or central segmental pulmonary arteries bilaterally. No pericardial effusion. No thoracic aortic aneurysm or evidence of aortic dissection. Mediastinum/Nodes: No mass or enlarged lymph nodes are seen within the mediastinum. Esophagus is unremarkable. Trachea is unremarkable. Lungs/Pleura: Patchy ground-glass airspace opacities are seen throughout both lungs, RIGHT slightly greater than LEFT, consistent with multifocal pneumonia. No pleural effusion or pneumothorax. Upper Abdomen: Liver is low in density suggesting fatty infiltration, incompletely imaged. Limited images of the upper abdomen are otherwise unremarkable. Musculoskeletal: No acute or suspicious osseous finding. Mild degenerative spurring within the thoracic spine. Review of the MIP images confirms the above findings. IMPRESSION: 1. Bilateral multifocal pneumonia. 2. No pulmonary embolism seen. 3. Fatty infiltration of the liver. Electronically Signed   By: SFranki CabotM.D.   On: 11/28/2020 07:56   DG Chest Portable 1 View  Result Date: 11/27/2020 CLINICAL DATA:  COVID-19 positive, chest pain EXAM: PORTABLE CHEST 1 VIEW COMPARISON:  Radiograph 11/14/2016 FINDINGS: Heterogeneous mixed consolidative and interstitial opacities present the mid to lower lungs in a slight peripheral predominance compatible reported imaging features of COVID 19 pneumonia. No pneumothorax. No effusion. The cardiomediastinal contours  are unremarkable for portable technique. No acute  osseous or soft tissue abnormality. IMPRESSION: Appearance compatible with a multifocal pneumonia in the setting of COVID 19. Electronically Signed   By: Lovena Le M.D.   On: 11/27/2020 18:26   ECHOCARDIOGRAM COMPLETE  Result Date: 12/01/2020    ECHOCARDIOGRAM REPORT   Patient Name:   Alejandro Bowen Date of Exam: 12/01/2020 Medical Rec #:  [id number]    Height:       78.0 in Accession #:    [id number]   Weight:       255.7 lb Date of Birth:  12/30/1960   BSA:          2.506 m Patient Age:    38 years     BP:           123/88 mmHg Patient Gender: M            HR:           80 bpm. Exam Location:  Inpatient Procedure: 2D Echo, Cardiac Doppler and Color Doppler Indications:    Atrial Fibrillation 427.31 / I48.91  History:        Patient has no prior history of Echocardiogram examinations.                 Risk Factors:Hypertension.  Sonographer:    Bernadene Person RDCS Referring Phys: [id number] Venturia  1. Left ventricular ejection fraction, by estimation, is 60 to 65%. The left ventricle has normal function. The left ventricle has no regional wall motion abnormalities. Left ventricular diastolic function could not be evaluated.  2. Right ventricular systolic function is normal. The right ventricular size is normal.  3. The mitral valve is normal in structure. No evidence of mitral valve regurgitation. No evidence of mitral stenosis.  4. The aortic valve is normal in structure. Aortic valve regurgitation is not visualized. No aortic stenosis is present.  5. The inferior vena cava is normal in size with greater than 50% respiratory variability, suggesting right atrial pressure of 3 mmHg. FINDINGS  Left Ventricle: Left ventricular ejection fraction, by estimation, is 60 to 65%. The left ventricle has normal function. The left ventricle has no regional wall motion abnormalities. The left ventricular internal cavity size was normal in size. There is  no left ventricular hypertrophy. Left ventricular  diastolic function could not be evaluated due to atrial fibrillation. Left ventricular diastolic function could not be evaluated. Right Ventricle: The right ventricular size is normal. No increase in right ventricular wall thickness. Right ventricular systolic function is normal. Left Atrium: Left atrial size was normal in size. Right Atrium: Right atrial size was normal in size. Pericardium: There is no evidence of pericardial effusion. Mitral Valve: The mitral valve is normal in structure. No evidence of mitral valve regurgitation. No evidence of mitral valve stenosis. Tricuspid Valve: The tricuspid valve is normal in structure. Tricuspid valve regurgitation is not demonstrated. No evidence of tricuspid stenosis. Aortic Valve: The aortic valve is normal in structure. Aortic valve regurgitation is not visualized. No aortic stenosis is present. Pulmonic Valve: The pulmonic valve was normal in structure. Pulmonic valve regurgitation is not visualized. No evidence of pulmonic stenosis. Aorta: The aortic root is normal in size and structure. Venous: The inferior vena cava is normal in size with greater than 50% respiratory variability, suggesting right atrial pressure of 3 mmHg. IAS/Shunts: No atrial level shunt detected by color flow Doppler.  LEFT VENTRICLE PLAX 2D LVIDd:  4.90 cm LVIDs:         3.40 cm LV PW:         1.00 cm LV IVS:        1.20 cm LVOT diam:     2.00 cm LV SV:         48 LV SV Index:   19 LVOT Area:     3.14 cm  RIGHT VENTRICLE TAPSE (M-mode): 1.7 cm LEFT ATRIUM             Index       RIGHT ATRIUM           Index LA diam:        3.50 cm 1.40 cm/m  RA Area:     14.30 cm LA Vol (A2C):   43.4 ml 17.32 ml/m RA Volume:   32.20 ml  12.85 ml/m LA Vol (A4C):   58.8 ml 23.46 ml/m LA Biplane Vol: 52.2 ml 20.83 ml/m  AORTIC VALVE LVOT Vmax:   83.13 cm/s LVOT Vmean:  59.433 cm/s LVOT VTI:    0.154 m  AORTA Ao Root diam: 3.30 cm Ao Asc diam:  3.30 cm  SHUNTS Systemic VTI:  0.15 m Systemic Diam:  2.00 cm Dani Gobble Croitoru MD Electronically signed by Sanda Klein MD Signature Date/Time: 12/01/2020/4:02:10 PM    Final      TODAY-DAY OF DISCHARGE:  Subjective:   Alejandro Bowen today has no headache,no chest abdominal pain,no new weakness tingling or numbness, feels much better wants to go home today.  Objective:   Blood pressure 101/69, pulse 89, temperature 98.5 F (36.9 C), temperature source Oral, resp. rate 19, height _0  (1.981 m), weight 102.3 kg, SpO2 90 %. No intake or output data in the 24 hours ending 12/09/20 1009 Filed Weights   12/07/20 0621 12/08/20 0510 12/09/20 0502  Weight: 101.3 kg 102.3 kg 102.3 kg    Exam: Awake Alert, Oriented *3, No new F.N deficits, Normal affect Hunter.AT,PERRAL Supple Neck,No JVD, No cervical lymphadenopathy appriciated.  Symmetrical Chest wall movement, Good air movement bilaterally, CTAB RRR,No Gallops,Rubs or new Murmurs, No Parasternal Heave +ve B.Sounds, Abd Soft, Non tender, No organomegaly appriciated, No rebound -guarding or rigidity. No Cyanosis, Clubbing or edema, No new Rash or bruise   PERTINENT RADIOLOGIC STUDIES: CT Angio Chest PE W/Cm &/Or Wo Cm  Result Date: 11/28/2020 CLINICAL DATA:  Chest pain. EXAM: CT ANGIOGRAPHY CHEST WITH CONTRAST TECHNIQUE: Multidetector CT imaging of the chest was performed using the standard protocol during bolus administration of intravenous contrast. Multiplanar CT image reconstructions and MIPs were obtained to evaluate the vascular anatomy. CONTRAST:  50m OMNIPAQUE IOHEXOL 350 MG/ML SOLN COMPARISON:  None. FINDINGS: Cardiovascular: Some of the most peripheral segmental and subsegmental pulmonary artery branches cannot be definitively characterize due to mild patient breathing motion artifact, however, there is no pulmonary embolism identified within the main, lobar or central segmental pulmonary arteries bilaterally. No pericardial effusion. No thoracic aortic aneurysm or evidence of aortic  dissection. Mediastinum/Nodes: No mass or enlarged lymph nodes are seen within the mediastinum. Esophagus is unremarkable. Trachea is unremarkable. Lungs/Pleura: Patchy ground-glass airspace opacities are seen throughout both lungs, RIGHT slightly greater than LEFT, consistent with multifocal pneumonia. No pleural effusion or pneumothorax. Upper Abdomen: Liver is low in density suggesting fatty infiltration, incompletely imaged. Limited images of the upper abdomen are otherwise unremarkable. Musculoskeletal: No acute or suspicious osseous finding. Mild degenerative spurring within the thoracic spine. Review of the MIP images confirms the above findings. IMPRESSION:  1. Bilateral multifocal pneumonia. 2. No pulmonary embolism seen. 3. Fatty infiltration of the liver. Electronically Signed   By: Franki Cabot M.D.   On: 11/28/2020 07:56   DG Chest Portable 1 View  Result Date: 11/27/2020 CLINICAL DATA:  COVID-19 positive, chest pain EXAM: PORTABLE CHEST 1 VIEW COMPARISON:  Radiograph 11/14/2016 FINDINGS: Heterogeneous mixed consolidative and interstitial opacities present the mid to lower lungs in a slight peripheral predominance compatible reported imaging features of COVID 19 pneumonia. No pneumothorax. No effusion. The cardiomediastinal contours are unremarkable for portable technique. No acute osseous or soft tissue abnormality. IMPRESSION: Appearance compatible with a multifocal pneumonia in the setting of COVID 19. Electronically Signed   By: Lovena Le M.D.   On: 11/27/2020 18:26   ECHOCARDIOGRAM COMPLETE  Result Date: 12/01/2020    ECHOCARDIOGRAM REPORT   Patient Name:   Alejandro Bowen Date of Exam: 12/01/2020 Medical Rec #:  [id number]    Height:       78.0 in Accession #:    [id number]   Weight:       255.7 lb Date of Birth:  1961-07-19   BSA:          2.506 m Patient Age:    6 years     BP:           123/88 mmHg Patient Gender: M            HR:           80 bpm. Exam Location:  Inpatient Procedure:  2D Echo, Cardiac Doppler and Color Doppler Indications:    Atrial Fibrillation 427.31 / I48.91  History:        Patient has no prior history of Echocardiogram examinations.                 Risk Factors:Hypertension.  Sonographer:    Bernadene Person RDCS Referring Phys: [id number] [place]  1. Left ventricular ejection fraction, by estimation, is 60 to 65%. The left ventricle has normal function. The left ventricle has no regional wall motion abnormalities. Left ventricular diastolic function could not be evaluated.  2. Right ventricular systolic function is normal. The right ventricular size is normal.  3. The mitral valve is normal in structure. No evidence of mitral valve regurgitation. No evidence of mitral stenosis.  4. The aortic valve is normal in structure. Aortic valve regurgitation is not visualized. No aortic stenosis is present.  5. The inferior vena cava is normal in size with greater than 50% respiratory variability, suggesting right atrial pressure of 3 mmHg. FINDINGS  Left Ventricle: Left ventricular ejection fraction, by estimation, is 60 to 65%. The left ventricle has normal function. The left ventricle has no regional wall motion abnormalities. The left ventricular internal cavity size was normal in size. There is  no left ventricular hypertrophy. Left ventricular diastolic function could not be evaluated due to atrial fibrillation. Left ventricular diastolic function could not be evaluated. Right Ventricle: The right ventricular size is normal. No increase in right ventricular wall thickness. Right ventricular systolic function is normal. Left Atrium: Left atrial size was normal in size. Right Atrium: Right atrial size was normal in size. Pericardium: There is no evidence of pericardial effusion. Mitral Valve: The mitral valve is normal in structure. No evidence of mitral valve regurgitation. No evidence of mitral valve stenosis. Tricuspid Valve: The tricuspid valve is normal in  structure. Tricuspid valve regurgitation is not demonstrated. No evidence of tricuspid stenosis. Aortic Valve: The aortic  valve is normal in structure. Aortic valve regurgitation is not visualized. No aortic stenosis is present. Pulmonic Valve: The pulmonic valve was normal in structure. Pulmonic valve regurgitation is not visualized. No evidence of pulmonic stenosis. Aorta: The aortic root is normal in size and structure. Venous: The inferior vena cava is normal in size with greater than 50% respiratory variability, suggesting right atrial pressure of 3 mmHg. IAS/Shunts: No atrial level shunt detected by color flow Doppler.  LEFT VENTRICLE PLAX 2D LVIDd:         4.90 cm LVIDs:         3.40 cm LV PW:         1.00 cm LV IVS:        1.20 cm LVOT diam:     2.00 cm LV SV:         48 LV SV Index:   19 LVOT Area:     3.14 cm  RIGHT VENTRICLE TAPSE (M-mode): 1.7 cm LEFT ATRIUM             Index       RIGHT ATRIUM           Index LA diam:        3.50 cm 1.40 cm/m  RA Area:     14.30 cm LA Vol (A2C):   43.4 ml 17.32 ml/m RA Volume:   32.20 ml  12.85 ml/m LA Vol (A4C):   58.8 ml 23.46 ml/m LA Biplane Vol: 52.2 ml 20.83 ml/m  AORTIC VALVE LVOT Vmax:   83.13 cm/s LVOT Vmean:  59.433 cm/s LVOT VTI:    0.154 m  AORTA Ao Root diam: 3.30 cm Ao Asc diam:  3.30 cm  SHUNTS Systemic VTI:  0.15 m Systemic Diam: 2.00 cm Dani Gobble Croitoru MD Electronically signed by Sanda Klein MD Signature Date/Time: 12/01/2020/4:02:10 PM    Final      PERTINENT LAB RESULTS: CBC: Recent Labs    12/08/20 0540 12/09/20 0121  WBC 16.3* 20.7*  HGB 15.8 16.2  HCT 46.7 48.0  PLT 263 243   CMET CMP     Component Value Date/Time   NA 135 12/09/2020 0121   K 4.5 12/09/2020 0121   CL 101 12/09/2020 0121   CO2 26 12/09/2020 0121   GLUCOSE 76 12/09/2020 0121   BUN 41 (H) 12/09/2020 0121   CREATININE 1.22 12/09/2020 0121   CALCIUM 9.5 12/09/2020 0121   PROT 6.2 (L) 12/09/2020 0121   ALBUMIN 3.0 (L) 12/09/2020 0121   AST 44 (H)  12/09/2020 0121   ALT 102 (H) 12/09/2020 0121   ALKPHOS 67 12/09/2020 0121   BILITOT 1.0 12/09/2020 0121   GFRNONAA >60 12/09/2020 0121   GFRAA >60 11/14/2016 1958    GFR Estimated Creatinine Clearance: 87.4 mL/min (by C-G formula based on SCr of 1.22 mg/dL). No results for input(s): LIPASE, AMYLASE in the last 72 hours. No results for input(s): CKTOTAL, CKMB, CKMBINDEX, TROPONINI in the last 72 hours. Invalid input(s): POCBNP Recent Labs    12/08/20 0540 12/09/20 0121  DDIMER 0.48 0.52*   No results for input(s): HGBA1C in the last 72 hours. No results for input(s): CHOL, HDL, LDLCALC, TRIG, CHOLHDL, LDLDIRECT in the last 72 hours. No results for input(s): TSH, T4TOTAL, T3FREE, THYROIDAB in the last 72 hours.  Invalid input(s): FREET3 No results for input(s): VITAMINB12, FOLATE, FERRITIN, TIBC, IRON, RETICCTPCT in the last 72 hours. Coags: No results for input(s): INR in the last 72 hours.  Invalid input(s): PT Microbiology:  No results found for this or any previous visit (from the past 240 hour(s)).  FURTHER DISCHARGE INSTRUCTIONS:  Get Medicines reviewed and adjusted: Please take all your medications with you for your next visit with your Primary MD  Laboratory/radiological data: Please request your Primary MD to go over all hospital tests and procedure/radiological results at the follow up, please ask your Primary MD to get all Hospital records sent to his/her office.  In some cases, they will be blood work, cultures and biopsy results pending at the time of your discharge. Please request that your primary care M.D. goes through all the records of your hospital data and follows up on these results.  Also Note the following: If you experience worsening of your admission symptoms, develop shortness of breath, life threatening emergency, suicidal or homicidal thoughts you must seek medical attention immediately by calling 911 or calling your MD immediately  if symptoms  less severe.  You must read complete instructions/literature along with all the possible adverse reactions/side effects for all the Medicines you take and that have been prescribed to you. Take any new Medicines after you have completely understood and accpet all the possible adverse reactions/side effects.   Do not drive when taking Pain medications or sleeping medications (Benzodaizepines)  Do not take more than prescribed Pain, Sleep and Anxiety Medications. It is not advisable to combine anxiety,sleep and pain medications without talking with your primary care practitioner  Special Instructions: If you have smoked or chewed Tobacco  in the last 2 yrs please stop smoking, stop any regular Alcohol  and or any Recreational drug use.  Wear Seat belts while driving.  Please note: You were cared for by a hospitalist during your hospital stay. Once you are discharged, your primary care physician will handle any further medical issues. Please note that NO REFILLS for any discharge medications will be authorized once you are discharged, as it is imperative that you return to your primary care physician (or establish a relationship with a primary care physician if you do not have one) for your post hospital discharge needs so that they can reassess your need for medications and monitor your lab values.  Total Time spent coordinating discharge including counseling, education and face to face time equals 35 minutes.  SignedOren Binet 12/09/2020 10:09 AM

## 2020-12-09 NOTE — Progress Notes (Signed)
Hypoglycemic Event  CBG: 62  Treatment: 4 oz juice/soda  Symptoms: None  Follow-up CBG: [id number] CBG Result:84  Possible Reasons for Event: Unknown  Comments/MD notified:ghimire, MD; hold lantus    Alejandro Bowen

## 2020-12-09 NOTE — TOC Progression Note (Signed)
Transition of Care [facility]) - Progression Note    Patient Details  Name: Tore Carreker MRN: [id number] Date of Birth: 1961/03/04  Transition of Care [facility]) CM/SW Contact  Lawerance Sabal, RN Phone Number: 12/09/2020, 8:40 AM  Clinical Narrative:   DME oxygen for home use ordered through [employer].     Expected Discharge Plan: Home/Self Care Barriers to Discharge: Continued Medical Work up  Expected Discharge Plan and Services Expected Discharge Plan: Home/Self Care   Discharge Planning Services: CM Consult,Medication Assistance Post Acute Care Choice: Durable Medical Equipment Living arrangements for the past 2 months: Single Family Home                 DME Arranged: Oxygen DME Agency:  [facility]) Date DME Agency Contacted: 12/09/20 Time DME Agency Contacted: [phone] Representative spoke with at DME Agency: Vaughan Basta             Social Determinants of Health (SDOH) Interventions    Readmission Risk Interventions No flowsheet data found.

## 2020-12-09 NOTE — Progress Notes (Signed)
Reviewed dc instructions with patient, all questions answered at this time. Eliquis card given to patient along with hard prescriptions. O2 at bedside. All belongings with patient.

## 2020-12-09 NOTE — Telephone Encounter (Signed)
Appt sch w/pt 12/24/20 @ 2:45 with Dr Odis Hollingshead

## 2020-12-09 NOTE — TOC Transition Note (Signed)
Transition of Care [facility]) - CM/SW Discharge Note   Patient Details  Name: Alejandro Bowen MRN: [id number] Date of Birth: 03-16-1961  Transition [facility]) CM/SW Contact:  Lawerance Sabal, RN Phone Number: 12/09/2020, 10:49 AM   Clinical Narrative:   Patient to Dc to home, spouse to provide transportation. Home oxygen has been delivered to unit. Eliquis card provided to patient. Discussed w patient, all questions answered.     Final next level of care: Home/Self Care Barriers to Discharge: No Barriers Identified   Patient Goals and CMS Choice Patient states their goals for this hospitalization and ongoing recovery are:: to go home      Discharge Placement                       Discharge Plan and Services   Discharge Planning Services: CM Consult,Medication Assistance Post Acute Care Choice: Durable Medical Equipment          DME Arranged: Oxygen DME Agency:  [facility]) Date DME Agency Contacted: 12/09/20 Time DME Agency Contacted: [phone] Representative spoke with at DME Agency: Vaughan Basta            Social Determinants of Health (SDOH) Interventions     Readmission Risk Interventions No flowsheet data found.

## 2020-12-11 ENCOUNTER — Telehealth: Payer: Self-pay

## 2020-12-11 NOTE — Telephone Encounter (Signed)
-----   Message from April Harrington, [place] sent at 12/09/2020 10:02 AM EST ----- Regarding: TOC Pt needs TOC per JG post afib

## 2020-12-11 NOTE — Telephone Encounter (Signed)
Location of hospitalization: [place] Reason for hospitalization:covid, pneumonia Date of discharge: 12/09/20 Date of first communication with patient: today Person contacting patient: Madelaine Bhat  Current symptoms: na Do you understand why you were in the Hospital: Yes Questions regarding discharge instructions: None Where were you discharged to: Home Medications reviewed: Yes Allergies reviewed: Yes Dietary changes reviewed: Yes. Discussed low fat and low salt diet.  Referals reviewed: NA Activities of Daily Living: Able to with mild limitations Any transportation issues/concerns: None Any patient concerns: None Confirmed importance & date/time of Follow up appt: Yes Confirmed with patient if condition begins to worsen call. Pt was given the office number and encouraged to call back with questions or concerns: Yes

## 2020-12-24 ENCOUNTER — Ambulatory Visit: Payer: BC Managed Care – PPO | Admitting: Cardiology

## 2020-12-24 ENCOUNTER — Ambulatory Visit: Payer: Self-pay | Admitting: Cardiology

## 2020-12-24 ENCOUNTER — Encounter: Payer: Self-pay | Admitting: Cardiology

## 2020-12-24 ENCOUNTER — Other Ambulatory Visit: Payer: Self-pay

## 2020-12-24 VITALS — BP 110/81 | HR 85 | Ht 78.0 in | Wt 255.0 lb

## 2020-12-24 DIAGNOSIS — Z7901 Long term (current) use of anticoagulants: Secondary | ICD-10-CM

## 2020-12-24 DIAGNOSIS — Z794 Long term (current) use of insulin: Secondary | ICD-10-CM

## 2020-12-24 DIAGNOSIS — I4819 Other persistent atrial fibrillation: Secondary | ICD-10-CM

## 2020-12-24 DIAGNOSIS — I10 Essential (primary) hypertension: Secondary | ICD-10-CM

## 2020-12-24 DIAGNOSIS — E1159 Type 2 diabetes mellitus with other circulatory complications: Secondary | ICD-10-CM

## 2020-12-24 NOTE — Progress Notes (Signed)
Date:  12/24/2020   ID:  Alejandro Bowen, DOB 13-Feb-1961, MRN [id number]  PCP:  Christain Sacramento, MD  Cardiologist:  Rex Kras, DO, [facility] (established care 12/24/2020)  REASON FOR CONSULT: Atrial fibrillation  REQUESTING PHYSICIAN:  Christain Sacramento, MD [place],  [place] [id number]  Chief Complaint  Patient presents with  . Atrial Fibrillation  . Coronary Artery Disease    HPI  Alejandro Bowen is a 59 y.o. male who presents to the office with a chief complaint of " atrial fibrillation management." Patient's past medical history and cardiovascular risk factors include: Hypertension, anxiety, obesity due to excess calories, recent COVID-19 infection, acute hypoxic respiratory failure due to COVID-19 pneumonia, atrial fibrillation.  He is referred to the office at the request of Christain Sacramento, MD for evaluation of management of atrial fibrillation.  Patient was recently diagnosed with COVID-19 on November 20, 2020.  Due to worsening symptoms he went to the hospital on November 27, 2020 and stayed there until December 09, 2020.  During the hospitalization he was treated for acute hypoxic respiratory failure due to COVID-19 pneumonia and during that hospitalization he was also found to be in A. fib with rapid ventricular rate.  Patient's ventricular rate was controlled with AV nodal blocking agents and he was started on oral anticoagulation for thromboembolic prophylaxis given his CHA2DS2-VASc SCORE.  At the time of discharge he was recommended to follow-up with our practice for the management of atrial fibrillation.  Since discharge patient states that he feels well no longer has shortness of breath.  EKG today continues to show persistent atrial fibrillation with controlled ventricular rate.  Discussed pathophysiology and treatment of atrial fibrillation at length during today's office visit.  FUNCTIONAL STATUS: No structured exercise program or daily routine. But is active  with his job duties.   ALLERGIES: Allergies  Allergen Reactions  . Penicillins Hives  . Alprazolam Other (See Comments)    depression    MEDICATION LIST PRIOR TO VISIT: Current Meds  Medication Sig  . albuterol (VENTOLIN HFA) 108 (90 Base) MCG/ACT inhaler Inhale 2 puffs into the lungs every 6 (six) hours as needed for wheezing or shortness of breath.  Marland Kitchen apixaban (ELIQUIS) 5 MG TABS tablet Take 1 tablet (5 mg total) by mouth 2 (two) times daily.  . citalopram (CELEXA) 20 MG tablet Take 20 mg by mouth daily.  Marland Kitchen glucose blood (FREESTYLE TEST STRIPS) test strip Use as instructed  . insulin aspart (NOVOLOG) 100 UNIT/ML FlexPen 0-20 Units, Subcutaneous, 3 times daily with meals CBG < 70: Implement Hypoglycemia measures, call MD CBG 70 - 120: 0 units CBG 121 - 150: 3 units CBG 151 - 200: 4 units CBG 201 - 250: 7 units CBG 251 - 300: 11 units CBG 301 - 350: 15 units CBG 351 - 400: 20 units CBG > 400: call MD  . Insulin Pen Needle 32G X 4 MM MISC 1 each by Does not apply route as needed.  . Lancets (FREESTYLE) lancets Use as instructed  . metoprolol tartrate (LOPRESSOR) 100 MG tablet Take 1 tablet (100 mg total) by mouth 2 (two) times daily.  . pantoprazole (PROTONIX) 40 MG tablet Take 1 tablet (40 mg total) by mouth daily.  . predniSONE (DELTASONE) 10 MG tablet Take 30 mg daily for 1 day, 20 mg daily for 1 days,10 mg daily for 1 day, then stop     PAST MEDICAL HISTORY: Past Medical History:  Diagnosis Date  . Anxiety   .  Atrial fibrillation ([place])   . Diabetes mellitus without complication ([place])   . History of COVID-19   . Hypertension     PAST SURGICAL HISTORY: History reviewed. No pertinent surgical history.  FAMILY HISTORY: The patient family history includes Dementia in his mother; Heart disease in his father.  SOCIAL HISTORY:  The patient  reports that he has never smoked. He has never used smokeless tobacco. He reports that he does not drink alcohol and does not use  drugs.  REVIEW OF SYSTEMS: Review of Systems  Constitutional: Negative for chills and fever.  HENT: Negative for hoarse voice and nosebleeds.   Eyes: Negative for discharge, double vision and pain.  Cardiovascular: Negative for chest pain, claudication, dyspnea on exertion, leg swelling, near-syncope, orthopnea, palpitations, paroxysmal nocturnal dyspnea and syncope.  Respiratory: Negative for hemoptysis and shortness of breath.   Musculoskeletal: Negative for muscle cramps and myalgias.  Gastrointestinal: Negative for abdominal pain, constipation, diarrhea, hematemesis, hematochezia, melena, nausea and vomiting.  Neurological: Negative for dizziness and light-headedness.    PHYSICAL EXAM: Vitals with BMI 12/24/2020 12/09/2020 12/09/2020  Height 6' 6"  - -  Weight 255 lbs - 225 lbs 8 oz  BMI 93.23 - 55.73  Systolic 220 254 270  Diastolic 81 69 83  Pulse 85 89 81    CONSTITUTIONAL: Well-developed and well-nourished. No acute distress.  SKIN: Skin is warm and dry. No rash noted. No cyanosis. No pallor. No jaundice HEAD: Normocephalic and atraumatic.  EYES: No scleral icterus MOUTH/THROAT: Moist oral membranes.  NECK: Unable to evaluate JVP due to a short neck with adipose tissue. No thyromegaly noted. No carotid bruits  LYMPHATIC: No visible cervical adenopathy.  CHEST Normal respiratory effort. No intercostal retractions  LUNGS: Clear to auscultation bilaterally.  No stridor. No wheezes. No rales.  CARDIOVASCULAR: Irregularly irregular, positive S1-S2, no murmurs rubs or gallops appreciated ABDOMINAL: Obese, soft, nontender, nondistended, positive bowel sounds in all 4 quadrants, no apparent ascites.  EXTREMITIES: No peripheral edema  HEMATOLOGIC: No significant bruising NEUROLOGIC: Oriented to person, place, and time. Nonfocal. Normal muscle tone.  PSYCHIATRIC: Normal mood and affect. Normal behavior. Cooperative  CARDIAC DATABASE: EKG: 12/24/2020: Atrial fibrillation, 88  bpm, normal axis, without underlying injury pattern.  Echocardiogram: 12/01/2020: 1. Left ventricular ejection fraction, by estimation, is 60 to 65%. The  left ventricle has normal function. The left ventricle has no regional  wall motion abnormalities. Left ventricular diastolic function could not  be evaluated.  2. Right ventricular systolic function is normal. The right ventricular  size is normal.  3. The mitral valve is normal in structure. No evidence of mitral valve  regurgitation. No evidence of mitral stenosis.  4. The aortic valve is normal in structure. Aortic valve regurgitation is  not visualized. No aortic stenosis is present.  5. The inferior vena cava is normal in size with greater than 50%  respiratory variability, suggesting right atrial pressure of 3 mmHg.    Stress Testing: Atleast 5-6 years ago outside facility.   Heart Catheterization: None  LABORATORY DATA: CBC Latest Ref Rng & Units 12/09/2020 12/08/2020 12/07/2020  WBC 4.0 - 10.5 K/uL 20.7(H) 16.3(H) 16.4(H)  Hemoglobin 13.0 - 17.0 g/dL 16.2 15.8 16.1  Hematocrit 39.0 - 52.0 % 48.0 46.7 47.5  Platelets 150 - 400 K/uL 243 263 231    CMP Latest Ref Rng & Units 12/09/2020 12/08/2020 12/07/2020  Glucose 70 - 99 mg/dL 76 144(H) 170(H)  BUN 6 - 20 mg/dL 41(H) 41(H) 38(H)  Creatinine 0.61 - 1.24  mg/dL 1.22 1.14 1.14  Sodium 135 - 145 mmol/L 135 135 135  Potassium 3.5 - 5.1 mmol/L 4.5 4.8 4.8  Chloride 98 - 111 mmol/L 101 101 102  CO2 22 - 32 mmol/L 26 20(L) 23  Calcium 8.9 - 10.3 mg/dL 9.5 9.3 9.5  Total Protein 6.5 - 8.1 g/dL 6.2(L) 6.0(L) 6.6  Total Bilirubin 0.3 - 1.2 mg/dL 1.0 1.0 1.0  Alkaline Phos 38 - 126 U/L 67 65 74  AST 15 - 41 U/L 44(H) 37 41  ALT 0 - 44 U/L 102(H) 92(H) 107(H)    Lipid Panel     Component Value Date/Time   TRIG 95 11/28/2020 0240    No components found for: NTPROBNP No results for input(s): PROBNP in the last 8760 hours. Recent Labs    12/01/20 0057  TSH  0.965    BMP Recent Labs    12/07/20 0801 12/08/20 0540 12/09/20 0121  NA 135 135 135  K 4.8 4.8 4.5  CL 102 101 101  CO2 23 20* 26  GLUCOSE 170* 144* 76  BUN 38* 41* 41*  CREATININE 1.14 1.14 1.22  CALCIUM 9.5 9.3 9.5  GFRNONAA >60 >60 >60    HEMOGLOBIN A1C Lab Results  Component Value Date   HGBA1C 8.3 (H) 11/30/2020   MPG 191.51 11/30/2020    IMPRESSION:    ICD-10-CM   1. Persistent atrial fibrillation (HCC)  I48.19 EKG 12-Lead    CBC    CMP14+EGFR    SARS-COV-2 RNA,(COVID-19) QUAL NAAT  2. Long term (current) use of anticoagulants  Z79.01   3. Type 2 diabetes mellitus with other circulatory complication, with long-term current use of insulin (HCC)  E11.59    Z79.4   4. Long-term insulin use (HCC)  Z79.4   5. Benign hypertension  I10      RECOMMENDATIONS: Alejandro Bowen is a 59 y.o. male whose past medical history and cardiac risk factors include: Hypertension, anxiety, obesity due to excess calories, recent COVID-19 infection, acute hypoxic respiratory failure due to COVID-19 pneumonia, atrial fibrillation.  Atrial fibrillation, persistent: . I spent a considerable amount of time reviewing Afib, natural history, pathophysiology, rate control vs rhythm control strategy and risk for stroke.   . CHA2DS2-VASc SCORE is 2 (HTN, DM) which correlates to 2.9% risk of stroke per year. . Rate control: Metoprolol. Marland Kitchen Rhythm control: N/A. Marland Kitchen Thromboembolic prophylaxis: Eliquis. . Reiterated the risks, benefits, and alternatives to oral anticoagulation. . Shared decision was to proceed with TEE cardioversion to restore normal sinus rhythm. . After careful review of history and examination, the risks, benefits of transesophageal echocardiogram, and alternatives have been explained to the patient. Complications include but not limited to esophageal perforation (rare), gastrointestinal bleeding (rare), cardiac arrhythmia which can include cardiac arrest and death (rare), pharyngeal  irritation / discomfort with swallowing / hematoma, methemoglobinemia, bronchospasm, transient hypoxia, nonsustained ventricular tachycardia, transient atrial fibrillation, minimal hemoptysis, vomiting, hypotension, respiratory compromise, reaction to medications, unavoidable damage to teeth and gums, aspiration pneumonia  were reviewed with the patient.  Patient voices understands, provides verbal feedback, questions answered, and patient wishes to proceed with the procedure. . Risks, benefits, and alternatives of direct current cardioversion reviewed with the and patient. Risk includes but not limited to: potential for post-cardioversion rhythms, life-threatening arrhythmias (ventricular tachycardia and fibrillation, profound bradycardia, cardiac arrest) needing cardiopulmonary resuscitation, myocardial damage, acute pulmonary edema, skin burns, transient hypotension. Benefits include restoration of sinus rhythm. Alternatives to treatment were discussed, questions were answered, patient voices understanding and  provides verbal feedback.  Patient is willing to proceed.  . Echocardiogram results reviewed. . Would recommend stress test once normal sinus rhythm is restored.  Long-term oral anticoagulation:  Indication: Persistent atrial fibrillation.  Patient has been educated on the importance of monitoring for evidence of bleeding which includes but not limited to hemoptysis, hematochezia, melanotic stools, and hematuria. Patient educated on fall precautions and if he was to be injured despite the mechanism of injury he is to seek medical attention at the closest ER since he is on blood thinners.  Patient understands importance of this because if internal bleeding is not treated in a timely manner it may further lead to morbidity and/or mortality.  Patient voices understanding of these recommendations and provides verbal feedback.  Benign essential hypertension:  Office blood pressure is very well  controlled.  Medications reconciled.  Currently managed by primary care provider.  Insulin-dependent diabetes mellitus type 2:  Educated him regarding the importance of glycemic control.  Currently managed by primary care provider.  Obesity, due to excess calories: Body mass index is 29.47 kg/m. . I reviewed with the patient the importance of diet, regular physical activity/exercise, weight loss.   . Patient is educated on increasing physical activity gradually as tolerated.  With the goal of moderate intensity exercise for 30 minutes a day 5 days a week.  FINAL MEDICATION LIST END OF ENCOUNTER: No orders of the defined types were placed in this encounter.   There are no discontinued medications.   Current Outpatient Medications:  .  albuterol (VENTOLIN HFA) 108 (90 Base) MCG/ACT inhaler, Inhale 2 puffs into the lungs every 6 (six) hours as needed for wheezing or shortness of breath., Disp: 6.7 g, Rfl: 0 .  apixaban (ELIQUIS) 5 MG TABS tablet, Take 1 tablet (5 mg total) by mouth 2 (two) times daily., Disp: 60 tablet, Rfl: 0 .  citalopram (CELEXA) 20 MG tablet, Take 20 mg by mouth daily., Disp: , Rfl:  .  glucose blood (FREESTYLE TEST STRIPS) test strip, Use as instructed, Disp: 100 each, Rfl: 0 .  insulin aspart (NOVOLOG) 100 UNIT/ML FlexPen, 0-20 Units, Subcutaneous, 3 times daily with meals CBG < 70: Implement Hypoglycemia measures, call MD CBG 70 - 120: 0 units CBG 121 - 150: 3 units CBG 151 - 200: 4 units CBG 201 - 250: 7 units CBG 251 - 300: 11 units CBG 301 - 350: 15 units CBG 351 - 400: 20 units CBG > 400: call MD, Disp: 15 mL, Rfl: 0 .  Insulin Pen Needle 32G X 4 MM MISC, 1 each by Does not apply route as needed., Disp: 200 each, Rfl: 0 .  Lancets (FREESTYLE) lancets, Use as instructed, Disp: 100 each, Rfl: 0 .  metoprolol tartrate (LOPRESSOR) 100 MG tablet, Take 1 tablet (100 mg total) by mouth 2 (two) times daily., Disp: 60 tablet, Rfl: 0 .  pantoprazole (PROTONIX) 40 MG  tablet, Take 1 tablet (40 mg total) by mouth daily., Disp: 30 tablet, Rfl: 0 .  predniSONE (DELTASONE) 10 MG tablet, Take 30 mg daily for 1 day, 20 mg daily for 1 days,10 mg daily for 1 day, then stop, Disp: 6 tablet, Rfl: 0 .  blood glucose meter kit and supplies, Dispense based on patient and insurance preference. Use up to four times daily as directed. (FOR ICD-10 E10.9, E11.9)., Disp: 1 each, Rfl: 0 .  metFORMIN (GLUCOPHAGE) 500 MG tablet, Take 1 tablet (500 mg total) by mouth 2 (two) times daily with a meal.,  Disp: 60 tablet, Rfl: 0  Orders Placed This Encounter  Procedures  . SARS-COV-2 RNA,(COVID-19) QUAL NAAT  . CBC  . CMP14+EGFR  . EKG 12-Lead   There are no Patient Instructions on file for this visit.   --Continue cardiac medications as reconciled in final medication list. --Return in about 2 weeks (around 01/07/2021) for s/p TEE cardioversion., EKG prior to arrival.. Or sooner if needed. --Continue follow-up with your primary care physician regarding the management of your other chronic comorbid conditions.  Patient's questions and concerns were addressed to his satisfaction. He voices understanding of the instructions provided during this encounter.   This note was created using a voice recognition software as a result there may be grammatical errors inadvertently enclosed that do not reflect the nature of this encounter. Every attempt is made to correct such errors.  Rex Kras, [place], [facility]  Pager: [phone] Office: [phone]

## 2020-12-25 LAB — CMP14+EGFR
ALT: 101 IU/L — ABNORMAL HIGH (ref 0–44)
AST: 55 IU/L — ABNORMAL HIGH (ref 0–40)
Albumin/Globulin Ratio: 1.6 (ref 1.2–2.2)
Albumin: 3.9 g/dL (ref 3.8–4.9)
Alkaline Phosphatase: 87 IU/L (ref 44–121)
BUN/Creatinine Ratio: 19 (ref 9–20)
BUN: 17 mg/dL (ref 6–24)
Bilirubin Total: 0.3 mg/dL (ref 0.0–1.2)
CO2: 21 mmol/L (ref 20–29)
Calcium: 9.7 mg/dL (ref 8.7–10.2)
Chloride: 105 mmol/L (ref 96–106)
Creatinine, Ser: 0.88 mg/dL (ref 0.76–1.27)
GFR calc Af Amer: 110 mL/min/{1.73_m2} (ref >59)
GFR calc non Af Amer: 95 mL/min/{1.73_m2} (ref >59)
Globulin, Total: 2.5 g/dL (ref 1.5–4.5)
Glucose: 167 mg/dL — ABNORMAL HIGH (ref 65–99)
Potassium: 4.4 mmol/L (ref 3.5–5.2)
Sodium: 142 mmol/L (ref 134–144)
Total Protein: 6.4 g/dL (ref 6.0–8.5)

## 2020-12-25 LAB — CBC
Hematocrit: 41.1 % (ref 37.5–51.0)
Hemoglobin: 13.9 g/dL (ref 13.0–17.7)
MCH: 31 pg (ref 26.6–33.0)
MCHC: 33.8 g/dL (ref 31.5–35.7)
MCV: 92 fL (ref 79–97)
Platelets: 221 10*3/uL (ref 150–450)
RBC: 4.48 x10E6/uL (ref 4.14–5.80)
RDW: 13.9 % (ref 11.6–15.4)
WBC: 5.5 10*3/uL (ref 3.4–10.8)

## 2020-12-28 ENCOUNTER — Other Ambulatory Visit (HOSPITAL_COMMUNITY): Payer: Self-pay

## 2020-12-28 NOTE — Progress Notes (Signed)
Patient presented to [facility] testing for pre-procedure testing. Patient tested positive 11/23/2020, no need to retest for 90 days.pateient aware.

## 2020-12-30 ENCOUNTER — Ambulatory Visit (HOSPITAL_COMMUNITY): Payer: BC Managed Care – PPO

## 2020-12-30 ENCOUNTER — Ambulatory Visit (HOSPITAL_COMMUNITY)
Admission: RE | Admit: 2020-12-30 | Discharge: 2020-12-30 | Disposition: A | Discharge status: Home / Self Care | Payer: BC Managed Care – PPO | Attending: Cardiology | Admitting: Cardiology

## 2020-12-30 ENCOUNTER — Encounter (HOSPITAL_COMMUNITY): Payer: Self-pay | Admitting: Cardiology

## 2020-12-30 ENCOUNTER — Encounter (HOSPITAL_COMMUNITY)
Admission: RE | Disposition: A | Discharge status: Home / Self Care | Payer: Self-pay | Source: Home / Self Care | Attending: Cardiology

## 2020-12-30 ENCOUNTER — Ambulatory Visit (HOSPITAL_COMMUNITY): Payer: BC Managed Care – PPO | Admitting: Certified Registered"

## 2020-12-30 DIAGNOSIS — Z8679 Personal history of other diseases of the circulatory system: Secondary | ICD-10-CM | POA: Diagnosis not present

## 2020-12-30 DIAGNOSIS — Z538 Procedure and treatment not carried out for other reasons: Secondary | ICD-10-CM | POA: Diagnosis not present

## 2020-12-30 SURGERY — CANCELLED PROCEDURE

## 2020-12-30 NOTE — Progress Notes (Signed)
Patient admitted for cardioversion and TEE, upon checking vital signs he appeared to be in NSR. EKG confirmed- MD Odis Hollingshead aware, pt updated and discharged.

## 2020-12-30 NOTE — H&P (Signed)
Patient was scheduled for TEE cardioversion.   However, pre-procedure EKG noted NSR therefore, the case was canceled.   Patient update with the findings.   Continue current medications.   Follow up in 3 months.   Tessa Lerner, [place], [facility]  Pager: [phone] Office: [phone]

## 2020-12-30 NOTE — Anesthesia Preprocedure Evaluation (Signed)
Anesthesia Evaluation  Patient identified by MRN, date of birth, ID band Patient awake    Reviewed: Allergy & Precautions, NPO status , Patient's Chart, lab work & pertinent test results  Airway        Dental   Pulmonary  H/o COVID          Cardiovascular METS: 3 - Mets hypertension, + dysrhythmias Atrial Fibrillation      Neuro/Psych Anxiety    GI/Hepatic   Endo/Other  diabetes, Type 2, Insulin Dependent, Oral Hypoglycemic Agents  Renal/GU   negative genitourinary   Musculoskeletal negative musculoskeletal ROS (+)   Abdominal   Peds negative pediatric ROS (+)  Hematology negative hematology ROS (+)   Anesthesia Other Findings   Reproductive/Obstetrics negative OB ROS                             Anesthesia Physical Anesthesia Plan  ASA: III  Anesthesia Plan: MAC   Post-op Pain Management:    Induction: Intravenous  PONV Risk Score and Plan: 1 and Propofol infusion and TIVA  Airway Management Planned: Natural Airway and Simple Face Mask  Additional Equipment:   Intra-op Plan:   Post-operative Plan:   Informed Consent: I have reviewed the patients History and Physical, chart, labs and discussed the procedure including the risks, benefits and alternatives for the proposed anesthesia with the patient or authorized representative who has indicated his/her understanding and acceptance.       Plan Discussed with: CRNA and Anesthesiologist  Anesthesia Plan Comments:         Anesthesia Quick Evaluation

## 2021-01-07 ENCOUNTER — Ambulatory Visit: Payer: BC Managed Care – PPO | Admitting: Cardiology

## 2021-01-13 ENCOUNTER — Telehealth: Payer: Self-pay

## 2021-01-13 NOTE — Telephone Encounter (Signed)
Please provide the patient with 2 week sample of Eliquis while we figure out which medication he will be on long term.   Alternative are Xarelto or Coumadin.   Ask him to reach out to his insurance to see how much Xarelto 20mg  po qsupper will cost him before sending in a script.

## 2021-01-13 NOTE — Telephone Encounter (Signed)
Patient called asking if is there is another medication besides Eliquis because the cost out-of-pocket is $500 and he is currently waiting on patient assistance.

## 2021-02-09 NOTE — Telephone Encounter (Signed)
Patient has been called several times, unsuccessful.

## 2021-02-12 NOTE — Telephone Encounter (Signed)
Error

## 2021-03-24 NOTE — Progress Notes (Signed)
Date:  03/25/2021   ID:  Alejandro Bowen, DOB 16-Feb-1961, MRN [id number]  PCP:  Alejandro Sacramento, MD  Cardiologist: Alejandro Kras, DO, [facility] (established care 12/24/2020)  Date: 03/25/21 Last Office Visit: 12/24/2020  Chief Complaint  Patient presents with  . Follow-up  . Atrial Fibrillation    HPI  Alejandro Bowen is a 60 y.o. male who presents to the office with a chief complaint of " follow-up for atrial fibrillation management." Patient's past medical history and cardiovascular risk factors include: Hypertension, anxiety, obesity due to excess calories, recent COVID-19 infection, acute hypoxic respiratory failure due to COVID-19 pneumonia, paroxysmal atrial fibrillation.  He is referred to the office at the request of Alejandro Sacramento, MD for evaluation of management of atrial fibrillation.  Diagnosed with COVID-19 on November 20, 2020 and due to worsening symptoms he went to the hospital on November 27, 2020 and stayed there until December 09, 2020.  During the hospitalization he was treated for acute hypoxic respiratory failure due to COVID-19 pneumonia and during that hospitalization he was also found to be in A. fib with rapid ventricular rate.  He was started on AV nodal blocking agents and oral anticoagulation for thromboembolic prophylaxis.  At the last office visit the shared decision was to proceed with TEE cardioversion.  The procedure was scheduled in January 2022; however, on the day of the procedure patient had already converted to normal sinus rhythm and the procedure was aborted.  He now presents for follow-up.  Patient is accompanied by his wife at today's office visit.  He denies any chest pain or anginal equivalent.  Patient's wife is concerned with his blood pressure and would like [place] to manage it.  She states that the morning blood pressures range between 559-741 mmHg and diastolic blood pressures are around 89 mmHg.  And evening blood pressures are around 136/83 mmHg.     Patient denies any evidence of bleeding.  FUNCTIONAL STATUS: No structured exercise program or daily routine. But is active with his job duties.   ALLERGIES: Allergies  Allergen Reactions  . Penicillins Hives  . Alprazolam Other (See Comments)    depression    MEDICATION LIST PRIOR TO VISIT: Current Meds  Medication Sig  . apixaban (ELIQUIS) 5 MG TABS tablet Take 1 tablet (5 mg total) by mouth 2 (two) times daily.  . Ascorbic Acid (VITAMIN C) 1000 MG tablet Take 1,000 mg by mouth daily.  . blood glucose meter kit and supplies Dispense based on patient and insurance preference. Use up to four times daily as directed. (FOR ICD-10 E10.9, E11.9).  . CALCIUM PO Take 2 tablets by mouth daily.  . cholecalciferol (VITAMIN D3) 25 MCG (1000 UNIT) tablet Take 2,000 Units by mouth daily.  . citalopram (CELEXA) 20 MG tablet Take 20 mg by mouth daily.  . Cyanocobalamin (B-12) 5000 MCG CAPS Take 5,000 mcg by mouth daily.  Marland Kitchen glucose blood (FREESTYLE TEST STRIPS) test strip Use as instructed  . Insulin Pen Needle 32G X 4 MM MISC 1 each by Does not apply route as needed.  . Lancets (FREESTYLE) lancets Use as instructed  . Melatonin 10 MG CAPS Take 10 mg by mouth at bedtime as needed (sleep).  . metFORMIN (GLUCOPHAGE) 500 MG tablet Take 1 tablet (500 mg total) by mouth 2 (two) times daily with a meal.  . metoprolol tartrate (LOPRESSOR) 100 MG tablet Take 1 tablet (100 mg total) by mouth 2 (two) times daily.  . Multiple Vitamins-Minerals (ADULT GUMMY  PO) Take 2 capsules by mouth daily.  . pantoprazole (PROTONIX) 40 MG tablet Take 1 tablet (40 mg total) by mouth daily.  Marland Kitchen zinc gluconate 50 MG tablet Take 50 mg by mouth daily.     PAST MEDICAL HISTORY: Past Medical History:  Diagnosis Date  . Anxiety   . Atrial fibrillation ([place])   . Diabetes mellitus without complication ([place])   . History of COVID-19   . Hypertension     PAST SURGICAL HISTORY: History reviewed. No pertinent surgical  history.  FAMILY HISTORY: The patient family history includes Dementia in his mother; Heart disease in his father.  SOCIAL HISTORY:  The patient  reports that he has never smoked. He has never used smokeless tobacco. He reports that he does not drink alcohol and does not use drugs.  REVIEW OF SYSTEMS: Review of Systems  Constitutional: Negative for chills and fever.  HENT: Negative for hoarse voice and nosebleeds.   Eyes: Negative for discharge, double vision and pain.  Cardiovascular: Negative for chest pain, claudication, dyspnea on exertion, leg swelling, near-syncope, orthopnea, palpitations, paroxysmal nocturnal dyspnea and syncope.  Respiratory: Positive for shortness of breath and snoring. Negative for hemoptysis.   Musculoskeletal: Negative for muscle cramps and myalgias.  Gastrointestinal: Negative for abdominal pain, constipation, diarrhea, hematemesis, hematochezia, melena, nausea and vomiting.  Neurological: Negative for dizziness and light-headedness.    PHYSICAL EXAM: Vitals with BMI 03/25/2021 12/24/2020 12/09/2020  Height _0  _1  -  Weight 267 lbs 6 oz 255 lbs -  BMI 95.28 41.32 -  Systolic 440 102 725  Diastolic 79 81 69  Pulse 59 85 89    CONSTITUTIONAL: Well-developed and well-nourished. No acute distress.  SKIN: Skin is warm and dry. No rash noted. No cyanosis. No pallor. No jaundice HEAD: Normocephalic and atraumatic.  EYES: No scleral icterus MOUTH/THROAT: Moist oral membranes.  NECK: Unable to evaluate JVP due to a short neck with adipose tissue. No thyromegaly noted. No carotid bruits  LYMPHATIC: No visible cervical adenopathy.  CHEST Normal respiratory effort. No intercostal retractions  LUNGS: Clear to auscultation bilaterally.  No stridor. No wheezes. No rales.  CARDIOVASCULAR: Regular, positive S1-S2, no murmurs rubs or gallops appreciated ABDOMINAL: Obese, soft, nontender, nondistended, positive bowel sounds in all 4 quadrants, no apparent  ascites.  EXTREMITIES: No peripheral edema  HEMATOLOGIC: No significant bruising NEUROLOGIC: Oriented to person, place, and time. Nonfocal. Normal muscle tone.  PSYCHIATRIC: Normal mood and affect. Normal behavior. Cooperative  CARDIAC DATABASE: EKG: 03/25/2021: Sinus bradycardia, 59 bpm, normal axis, without underlying injury pattern.    Echocardiogram: 12/01/2020: 1. Left ventricular ejection fraction, by estimation, is 60 to 65%. The  left ventricle has normal function. The left ventricle has no regional  wall motion abnormalities. Left ventricular diastolic function could not  be evaluated.  2. Right ventricular systolic function is normal. The right ventricular  size is normal.  3. The mitral valve is normal in structure. No evidence of mitral valve  regurgitation. No evidence of mitral stenosis.  4. The aortic valve is normal in structure. Aortic valve regurgitation is  not visualized. No aortic stenosis is present.  5. The inferior vena cava is normal in size with greater than 50%  respiratory variability, suggesting right atrial pressure of 3 mmHg.    Stress Testing: Atleast 5-6 years ago outside facility.   Heart Catheterization: None  LABORATORY DATA: CBC Latest Ref Rng & Units 12/24/2020 12/09/2020 12/08/2020  WBC 3.4 - 10.8 x10E3/uL 5.5 20.7(H) 16.3(H)  Hemoglobin  13.0 - 17.7 g/dL 13.9 16.2 15.8  Hematocrit 37.5 - 51.0 % 41.1 48.0 46.7  Platelets 150 - 450 x10E3/uL 221 243 263    CMP Latest Ref Rng & Units 12/24/2020 12/09/2020 12/08/2020  Glucose 65 - 99 mg/dL 167(H) 76 144(H)  BUN 6 - 24 mg/dL 17 41(H) 41(H)  Creatinine 0.76 - 1.27 mg/dL 0.88 1.22 1.14  Sodium 134 - 144 mmol/L 142 135 135  Potassium 3.5 - 5.2 mmol/L 4.4 4.5 4.8  Chloride 96 - 106 mmol/L 105 101 101  CO2 20 - 29 mmol/L 21 26 20(L)  Calcium 8.7 - 10.2 mg/dL 9.7 9.5 9.3  Total Protein 6.0 - 8.5 g/dL 6.4 6.2(L) 6.0(L)  Total Bilirubin 0.0 - 1.2 mg/dL 0.3 1.0 1.0  Alkaline Phos 44 - 121  IU/L 87 67 65  AST 0 - 40 IU/L 55(H) 44(H) 37  ALT 0 - 44 IU/L 101(H) 102(H) 92(H)    Lipid Panel     Component Value Date/Time   TRIG 95 11/28/2020 0240    No components found for: NTPROBNP No results for input(s): PROBNP in the last 8760 hours. Recent Labs    12/01/20 0057  TSH 0.965    BMP Recent Labs    12/08/20 0540 12/09/20 0121 12/24/20 1320  NA 135 135 142  K 4.8 4.5 4.4  CL 101 101 105  CO2 20* 26 21  GLUCOSE 144* 76 167*  BUN 41* 41* 17  CREATININE 1.14 1.22 0.88  CALCIUM 9.3 9.5 9.7  GFRNONAA >60 >60 95  GFRAA  --   --  110    HEMOGLOBIN A1C Lab Results  Component Value Date   HGBA1C 8.3 (H) 11/30/2020   MPG 191.51 11/30/2020   Lipid profile: Collected: 08/11/2020 Total cholesterol 189, triglycerides 142, HDL 45, LDL 113, non-HDL 144.  IMPRESSION:    ICD-10-CM   1. Paroxysmal atrial fibrillation (HCC)  I48.0 EKG 12-Lead  2. Long term (current) use of anticoagulants  Z79.01   3. Type 2 diabetes mellitus with other circulatory complication, without long-term current use of insulin (HCC)  E11.59   4. Benign hypertension  I10      RECOMMENDATIONS: Kinley Ferrentino is a 60 y.o. male whose past medical history and cardiac risk factors include: Hypertension, anxiety, obesity due to excess calories, recent COVID-19 infection, acute hypoxic respiratory failure due to COVID-19 pneumonia, paroxysmal atrial fibrillation.  Atrial fibrillation, paroxysmal: . Rate control: Metoprolol. Marland Kitchen Rhythm control: N/A. Marland Kitchen Thromboembolic prophylaxis: Eliquis . CHA2DS2-VASc SCORE is 2 (HTN, DM) which correlates to 2.9% risk of stroke per year.  Long-term oral anticoagulation:  Indication: Paroxysmal atrial fibrillation.  He does not endorse evidence of bleeding.  Reemphasize the risks, benefits, and alternatives to oral anticoagulation.  Benign essential hypertension:  Patient's wife is requesting cardiology to manage his blood pressure.  Home blood pressure log  reviewed.  We will start olmesartan 20 mg p.o. every afternoon  Given his body habitus, oral anatomy, elevated blood pressures in the morning, excessive snoring, I suspect clinically he has undiagnosed sleep apnea.  I will refer him to Dr. Brett Fairy for further evaluation and management for sleep apnea.  Low-salt diet recommended  Non-insulin-dependent diabetes mellitus type 2:  Patient states that he no longer is on insulin as he discontinued prednisone.    Diabetes currently managed by primary care provider.    Despite his underlying diabetes he is not on statin therapy.  His estimated 10-year risk of ASCVD is greater than 10% and therefore recommended  statin therapy.  Both the patient and wife agreed.    I requested him to have blood work done prior to initiating statin therapy.    Outside labs independently reviewed from care everywhere.    We will start Crestor 20 mg p.o. nightly   Coronary artery calcium score for further risk stratification.  Obesity, due to excess calories: Body mass index is 30.9 kg/m. . I reviewed with the patient the importance of diet, regular physical activity/exercise, weight loss.   . Patient is educated on increasing physical activity gradually as tolerated.  With the goal of moderate intensity exercise for 30 minutes a day 5 days a week.  Given the fact that now he is in normal sinus rhythm and he has multiple cardiovascular risk factors and his estimated 10-year risk of ASCVD is greater than 10% would like to schedule him for a exercise nuclear stress test to rule out reversible ischemia.  COVID screen prior to exercise nuclear stress test.  Patient's wife is thankful for the recommendations provided I will see the patient back in 4 weeks to discuss test results and medication follow-up.  FINAL MEDICATION LIST END OF ENCOUNTER: No orders of the defined types were placed in this encounter.   Medications Discontinued During This Encounter   Medication Reason  . predniSONE (DELTASONE) 10 MG tablet Error  . albuterol (VENTOLIN HFA) 108 (90 Base) MCG/ACT inhaler Error  . insulin aspart (NOVOLOG) 100 UNIT/ML FlexPen Error     Current Outpatient Medications:  .  apixaban (ELIQUIS) 5 MG TABS tablet, Take 1 tablet (5 mg total) by mouth 2 (two) times daily., Disp: 60 tablet, Rfl: 0 .  Ascorbic Acid (VITAMIN C) 1000 MG tablet, Take 1,000 mg by mouth daily., Disp: , Rfl:  .  blood glucose meter kit and supplies, Dispense based on patient and insurance preference. Use up to four times daily as directed. (FOR ICD-10 E10.9, E11.9)., Disp: 1 each, Rfl: 0 .  CALCIUM PO, Take 2 tablets by mouth daily., Disp: , Rfl:  .  cholecalciferol (VITAMIN D3) 25 MCG (1000 UNIT) tablet, Take 2,000 Units by mouth daily., Disp: , Rfl:  .  citalopram (CELEXA) 20 MG tablet, Take 20 mg by mouth daily., Disp: , Rfl:  .  Cyanocobalamin (B-12) 5000 MCG CAPS, Take 5,000 mcg by mouth daily., Disp: , Rfl:  .  glucose blood (FREESTYLE TEST STRIPS) test strip, Use as instructed, Disp: 100 each, Rfl: 0 .  Insulin Pen Needle 32G X 4 MM MISC, 1 each by Does not apply route as needed., Disp: 200 each, Rfl: 0 .  Lancets (FREESTYLE) lancets, Use as instructed, Disp: 100 each, Rfl: 0 .  Melatonin 10 MG CAPS, Take 10 mg by mouth at bedtime as needed (sleep)., Disp: , Rfl:  .  metFORMIN (GLUCOPHAGE) 500 MG tablet, Take 1 tablet (500 mg total) by mouth 2 (two) times daily with a meal., Disp: 60 tablet, Rfl: 0 .  metoprolol tartrate (LOPRESSOR) 100 MG tablet, Take 1 tablet (100 mg total) by mouth 2 (two) times daily., Disp: 60 tablet, Rfl: 0 .  Multiple Vitamins-Minerals (ADULT GUMMY PO), Take 2 capsules by mouth daily., Disp: , Rfl:  .  pantoprazole (PROTONIX) 40 MG tablet, Take 1 tablet (40 mg total) by mouth daily., Disp: 30 tablet, Rfl: 0 .  zinc gluconate 50 MG tablet, Take 50 mg by mouth daily., Disp: , Rfl:   Orders Placed This Encounter  Procedures  . EKG 12-Lead    There are no  Patient Instructions on file for this visit.   --Continue cardiac medications as reconciled in final medication list. --Return in about 4 weeks (around 04/22/2021) for Follow up, Lipid, BP, Review test results. Or sooner if needed. --Continue follow-up with your primary care physician regarding the management of your other chronic comorbid conditions.  Patient's questions and concerns were addressed to his satisfaction. He voices understanding of the instructions provided during this encounter.   This note was created using a voice recognition software as a result there may be grammatical errors inadvertently enclosed that do not reflect the nature of this encounter. Every attempt is made to correct such errors.  Alejandro Bowen, [place], [facility]  Pager: [phone] Office: [phone]

## 2021-03-25 ENCOUNTER — Other Ambulatory Visit: Payer: Self-pay

## 2021-03-25 ENCOUNTER — Ambulatory Visit: Payer: BC Managed Care – PPO | Admitting: Cardiology

## 2021-03-25 ENCOUNTER — Encounter: Payer: Self-pay | Admitting: Cardiology

## 2021-03-25 VITALS — BP 141/79 | HR 59 | Resp 16 | Ht 78.0 in | Wt 267.4 lb

## 2021-03-25 DIAGNOSIS — I480 Paroxysmal atrial fibrillation: Secondary | ICD-10-CM

## 2021-03-25 DIAGNOSIS — E1159 Type 2 diabetes mellitus with other circulatory complications: Secondary | ICD-10-CM

## 2021-03-25 DIAGNOSIS — Z794 Long term (current) use of insulin: Secondary | ICD-10-CM

## 2021-03-25 DIAGNOSIS — Z7901 Long term (current) use of anticoagulants: Secondary | ICD-10-CM

## 2021-03-25 DIAGNOSIS — I10 Essential (primary) hypertension: Secondary | ICD-10-CM

## 2021-03-25 DIAGNOSIS — I4819 Other persistent atrial fibrillation: Secondary | ICD-10-CM

## 2021-03-25 MED ORDER — ROSUVASTATIN CALCIUM 20 MG PO TABS: 20.0000 mg | ORAL_TABLET | Freq: Every evening | ORAL | 0 refills | Status: DC

## 2021-03-25 MED ORDER — OLMESARTAN MEDOXOMIL 20 MG PO TABS
20.0000 mg | ORAL_TABLET | Freq: Every evening | ORAL | 0 refills | Status: DC
Start: 2021-03-25 — End: 2021-04-12

## 2021-03-26 ENCOUNTER — Other Ambulatory Visit: Payer: Self-pay

## 2021-03-26 DIAGNOSIS — I10 Essential (primary) hypertension: Secondary | ICD-10-CM

## 2021-03-26 DIAGNOSIS — E1159 Type 2 diabetes mellitus with other circulatory complications: Secondary | ICD-10-CM

## 2021-04-02 ENCOUNTER — Other Ambulatory Visit (HOSPITAL_COMMUNITY)
Admission: RE | Admit: 2021-04-02 | Discharge: 2021-04-02 | Disposition: A | Discharge status: Home / Self Care | Payer: BC Managed Care – PPO | Source: Ambulatory Visit | Attending: Cardiology | Admitting: Cardiology

## 2021-04-02 DIAGNOSIS — Z20822 Contact with and (suspected) exposure to covid-19: Secondary | ICD-10-CM | POA: Insufficient documentation

## 2021-04-02 DIAGNOSIS — Z01812 Encounter for preprocedural laboratory examination: Secondary | ICD-10-CM | POA: Diagnosis present

## 2021-04-03 LAB — LIPID PANEL WITH LDL/HDL RATIO
Cholesterol, Total: 159 mg/dL (ref 100–199)
HDL: 44 mg/dL (ref >39)
LDL Chol Calc (NIH): 94 mg/dL (ref 0–99)
LDL/HDL Ratio: 2.1 ratio (ref 0.0–3.6)
Triglycerides: 117 mg/dL (ref 0–149)
VLDL Cholesterol Cal: 21 mg/dL (ref 5–40)

## 2021-04-03 LAB — MAGNESIUM: Magnesium: 2.2 mg/dL (ref 1.6–2.3)

## 2021-04-03 LAB — CMP14+EGFR
ALT: 76 IU/L — ABNORMAL HIGH (ref 0–44)
AST: 50 IU/L — ABNORMAL HIGH (ref 0–40)
Albumin/Globulin Ratio: 2.3 — ABNORMAL HIGH (ref 1.2–2.2)
Albumin: 4.8 g/dL (ref 3.8–4.9)
Alkaline Phosphatase: 95 IU/L (ref 44–121)
BUN/Creatinine Ratio: 17 (ref 9–20)
BUN: 16 mg/dL (ref 6–24)
Bilirubin Total: 0.3 mg/dL (ref 0.0–1.2)
CO2: 22 mmol/L (ref 20–29)
Calcium: 9.9 mg/dL (ref 8.7–10.2)
Chloride: 103 mmol/L (ref 96–106)
Creatinine, Ser: 0.93 mg/dL (ref 0.76–1.27)
Globulin, Total: 2.1 g/dL (ref 1.5–4.5)
Glucose: 126 mg/dL — ABNORMAL HIGH (ref 65–99)
Potassium: 4.7 mmol/L (ref 3.5–5.2)
Sodium: 141 mmol/L (ref 134–144)
Total Protein: 6.9 g/dL (ref 6.0–8.5)
eGFR: 96 mL/min/{1.73_m2} (ref >59)

## 2021-04-03 LAB — LDL CHOLESTEROL, DIRECT: LDL Direct: 92 mg/dL (ref 0–99)

## 2021-04-03 LAB — SARS CORONAVIRUS 2 (TAT 6-24 HRS): SARS Coronavirus 2: NEGATIVE

## 2021-04-05 ENCOUNTER — Other Ambulatory Visit: Payer: Self-pay

## 2021-04-05 ENCOUNTER — Ambulatory Visit: Payer: BC Managed Care – PPO

## 2021-04-05 DIAGNOSIS — I10 Essential (primary) hypertension: Secondary | ICD-10-CM

## 2021-04-05 DIAGNOSIS — I480 Paroxysmal atrial fibrillation: Secondary | ICD-10-CM

## 2021-04-05 DIAGNOSIS — Z7901 Long term (current) use of anticoagulants: Secondary | ICD-10-CM

## 2021-04-05 DIAGNOSIS — E1159 Type 2 diabetes mellitus with other circulatory complications: Secondary | ICD-10-CM

## 2021-04-11 ENCOUNTER — Other Ambulatory Visit: Payer: Self-pay | Admitting: Cardiology

## 2021-04-11 DIAGNOSIS — E1159 Type 2 diabetes mellitus with other circulatory complications: Secondary | ICD-10-CM

## 2021-04-11 DIAGNOSIS — I10 Essential (primary) hypertension: Secondary | ICD-10-CM

## 2021-05-07 ENCOUNTER — Other Ambulatory Visit: Payer: Self-pay

## 2021-05-07 ENCOUNTER — Ambulatory Visit: Payer: BC Managed Care – PPO | Admitting: Cardiology

## 2021-05-07 ENCOUNTER — Encounter: Payer: Self-pay | Admitting: Cardiology

## 2021-05-07 VITALS — BP 121/72 | HR 65 | Temp 97.3°F | Resp 16 | Ht 78.0 in | Wt 262.0 lb

## 2021-05-07 DIAGNOSIS — E1159 Type 2 diabetes mellitus with other circulatory complications: Secondary | ICD-10-CM

## 2021-05-07 DIAGNOSIS — I10 Essential (primary) hypertension: Secondary | ICD-10-CM

## 2021-05-07 DIAGNOSIS — I480 Paroxysmal atrial fibrillation: Secondary | ICD-10-CM

## 2021-05-07 DIAGNOSIS — Z7901 Long term (current) use of anticoagulants: Secondary | ICD-10-CM

## 2021-05-07 DIAGNOSIS — E782 Mixed hyperlipidemia: Secondary | ICD-10-CM

## 2021-05-07 MED ORDER — METOPROLOL TARTRATE 50 MG PO TABS: 50.0000 mg | ORAL_TABLET | Freq: Two times a day (BID) | ORAL | 0 refills | Status: DC

## 2021-05-07 NOTE — Progress Notes (Signed)
Date:  05/07/2021   ID:  Alejandro Bowen, DOB 1961/10/17, MRN [id number]  PCP:  Christain Sacramento, MD  Cardiologist: Rex Kras, DO, [facility] (established care 12/24/2020)  Date: 05/07/21 Last Office Visit: 03/25/2021  Chief Complaint  Patient presents with  . Hyperlipidemia  . Hypertension  . Results  . Follow-up    HPI  Alejandro Bowen is a 60 y.o. male who presents to the office with a chief complaint of "follow-up for management of hypertension, hyperlipidemia and review test results" Patient's past medical history and cardiovascular risk factors include: Hypertension, non-insulin-dependent diabetes mellitus type 2, hyperlipidemia anxiety, obesity due to excess calories, recent COVID-19 infection, acute hypoxic respiratory failure due to COVID-19 pneumonia, paroxysmal atrial fibrillation.  He is referred to the office at the request of Christain Sacramento, MD for evaluation of management of atrial fibrillation.  Diagnosed with COVID-19 on November 20, 2020 and due to worsening symptoms he went to the hospital on November 27, 2020 and stayed there until December 09, 2020.  During the hospitalization he was treated for acute hypoxic respiratory failure due to COVID-19 pneumonia and during that hospitalization he was also found to be in A. fib with rapid ventricular rate.  He was started on AV nodal blocking agents and oral anticoagulation for thromboembolic prophylaxis.  At the last office visit the shared decision was to proceed with TEE cardioversion.  The procedure was scheduled in January 2022; however, on the day of the procedure patient had already converted to normal sinus rhythm and the procedure was aborted.  At the last office visit patient's wife requested that I help manage his hypertension.  Patient was started on olmesartan at the last office visit and he is tolerated the medication well without any side effects or intolerances.  They are very happy with the progress that he has made as his  systolic blood pressures are now between 120-130 mmHG and DBP are between 80-85 mmHg.  Given his 10-year estimated risk of ASCVD and underlying diabetes he was also recommended to start Lipitor.  He is tolerating the medication well without any side effects or intolerances.  He has an appointment later this month to be evaluated for sleep apnea as well.  He has successfully lost 5 pounds since last office visit due to lifestyle changes, increasing physical activity, and has decreased the amount of time they go out to eat.  FUNCTIONAL STATUS: No structured exercise program or daily routine. But is active with his job duties.   ALLERGIES: Allergies  Allergen Reactions  . Penicillins Hives  . Alprazolam Other (See Comments)    depression    MEDICATION LIST PRIOR TO VISIT: Current Meds  Medication Sig  . apixaban (ELIQUIS) 5 MG TABS tablet Take 1 tablet (5 mg total) by mouth 2 (two) times daily.  . Ascorbic Acid (VITAMIN C) 1000 MG tablet Take 1,000 mg by mouth daily.  . blood glucose meter kit and supplies Dispense based on patient and insurance preference. Use up to four times daily as directed. (FOR ICD-10 E10.9, E11.9).  . CALCIUM PO Take 2 tablets by mouth daily.  . cholecalciferol (VITAMIN D3) 25 MCG (1000 UNIT) tablet Take 2,000 Units by mouth daily.  . citalopram (CELEXA) 20 MG tablet Take 20 mg by mouth daily.  Marland Kitchen glucose blood (FREESTYLE TEST STRIPS) test strip Use as instructed  . Insulin Pen Needle 32G X 4 MM MISC 1 each by Does not apply route as needed.  . Lancets (FREESTYLE) lancets Use  as instructed  . Melatonin 10 MG CAPS Take 10 mg by mouth at bedtime as needed (sleep).  . metFORMIN (GLUCOPHAGE) 500 MG tablet Take 1 tablet (500 mg total) by mouth 2 (two) times daily with a meal.  . metoprolol tartrate (LOPRESSOR) 50 MG tablet Take 1 tablet (50 mg total) by mouth 2 (two) times daily.  . Multiple Vitamins-Minerals (ADULT GUMMY PO) Take 2 capsules by mouth daily.  Marland Kitchen olmesartan  (BENICAR) 20 MG tablet TAKE 1 TABLET BY MOUTH EVERY DAY IN THE EVENING  . pantoprazole (PROTONIX) 40 MG tablet Take 1 tablet (40 mg total) by mouth daily.  . rosuvastatin (CRESTOR) 20 MG tablet TAKE 1 TABLET BY MOUTH EVERYDAY AT BEDTIME  . zinc gluconate 50 MG tablet Take 50 mg by mouth daily.  . [DISCONTINUED] metoprolol tartrate (LOPRESSOR) 100 MG tablet Take 1 tablet (100 mg total) by mouth 2 (two) times daily.     PAST MEDICAL HISTORY: Past Medical History:  Diagnosis Date  . Anxiety   . Atrial fibrillation ([place])   . Diabetes mellitus without complication ([place])   . History of COVID-19   . Hypertension     PAST SURGICAL HISTORY: History reviewed. No pertinent surgical history.  FAMILY HISTORY: The patient family history includes Dementia in his mother; Heart disease in his father.  SOCIAL HISTORY:  The patient  reports that he has never smoked. He has never used smokeless tobacco. He reports that he does not drink alcohol and does not use drugs.  REVIEW OF SYSTEMS: Review of Systems  Constitutional: Negative for chills and fever.  HENT: Negative for hoarse voice and nosebleeds.   Eyes: Negative for discharge, double vision and pain.  Cardiovascular: Negative for chest pain, claudication, dyspnea on exertion, leg swelling, near-syncope, orthopnea, palpitations, paroxysmal nocturnal dyspnea and syncope.  Respiratory: Positive for shortness of breath and snoring. Negative for hemoptysis.   Musculoskeletal: Negative for muscle cramps and myalgias.  Gastrointestinal: Negative for abdominal pain, constipation, diarrhea, hematemesis, hematochezia, melena, nausea and vomiting.  Neurological: Negative for dizziness and light-headedness.    PHYSICAL EXAM: Vitals with BMI 05/07/2021 03/25/2021 12/24/2020  Height 6' 6"  6' 6"  6' 6"   Weight 262 lbs 267 lbs 6 oz 255 lbs  BMI 30.28 34.35 68.61  Systolic 683 729 021  Diastolic 72 79 81  Pulse 65 59 85    CONSTITUTIONAL: Well-developed  and well-nourished. No acute distress.  SKIN: Skin is warm and dry. No rash noted. No cyanosis. No pallor. No jaundice HEAD: Normocephalic and atraumatic.  EYES: No scleral icterus MOUTH/THROAT: Moist oral membranes.  NECK: Unable to evaluate JVP due to a short neck with adipose tissue. No thyromegaly noted. No carotid bruits  LYMPHATIC: No visible cervical adenopathy.  CHEST Normal respiratory effort. No intercostal retractions  LUNGS: Clear to auscultation bilaterally.  No stridor. No wheezes. No rales.  CARDIOVASCULAR: Regular, positive S1-S2, no murmurs rubs or gallops appreciated ABDOMINAL: Obese, soft, nontender, nondistended, positive bowel sounds in all 4 quadrants, no apparent ascites.  EXTREMITIES: No peripheral edema  HEMATOLOGIC: No significant bruising NEUROLOGIC: Oriented to person, place, and time. Nonfocal. Normal muscle tone.  PSYCHIATRIC: Normal mood and affect. Normal behavior. Cooperative  CARDIAC DATABASE: EKG: 03/25/2021: Sinus bradycardia, 59 bpm, normal axis, without underlying injury pattern.    Echocardiogram: 12/01/2020: LVEF 60-65%, no regional wall motion abnormalities, no significant valvular abnormality.  Stress Testing: Exercise Sestamibi stress test 04/05/2021: Exercise nuclear stress test was performed using Bruce protocol. Patient reached 7 METS, and 87% of age  predicted maximum heart rate. Exercise capacity was low. No chest pain reported. Heart rate and hemodynamic response were normal. Stress EKG revealed no ischemic changes. Normal stress myocardial perfusion. Stress LVEF 52%. Low risk study.  Heart Catheterization: Exercise Sestamibi stress test 04/05/2021: Exercise nuclear stress test was performed using Bruce protocol. Patient reached 7 METS, and 87% of age predicted maximum heart rate. Exercise capacity was low. No chest pain reported. Heart rate and hemodynamic response were normal. Stress EKG revealed no ischemic changes. Normal stress  myocardial perfusion. Stress LVEF 52%. Low risk study.  LABORATORY DATA: CBC Latest Ref Rng & Units 12/24/2020 12/09/2020 12/08/2020  WBC 3.4 - 10.8 x10E3/uL 5.5 20.7(H) 16.3(H)  Hemoglobin 13.0 - 17.7 g/dL 13.9 16.2 15.8  Hematocrit 37.5 - 51.0 % 41.1 48.0 46.7  Platelets 150 - 450 x10E3/uL 221 243 263    CMP Latest Ref Rng & Units 04/02/2021 12/24/2020 12/09/2020  Glucose 65 - 99 mg/dL 126(H) 167(H) 76  BUN 6 - 24 mg/dL 16 17 41(H)  Creatinine 0.76 - 1.27 mg/dL 0.93 0.88 1.22  Sodium 134 - 144 mmol/L 141 142 135  Potassium 3.5 - 5.2 mmol/L 4.7 4.4 4.5  Chloride 96 - 106 mmol/L 103 105 101  CO2 20 - 29 mmol/L 22 21 26   Calcium 8.7 - 10.2 mg/dL 9.9 9.7 9.5  Total Protein 6.0 - 8.5 g/dL 6.9 6.4 6.2(L)  Total Bilirubin 0.0 - 1.2 mg/dL 0.3 0.3 1.0  Alkaline Phos 44 - 121 IU/L 95 87 67  AST 0 - 40 IU/L 50(H) 55(H) 44(H)  ALT 0 - 44 IU/L 76(H) 101(H) 102(H)   Lipid Panel     Component Value Date/Time   CHOL 159 04/02/2021 1238   TRIG 117 04/02/2021 1238   HDL 44 04/02/2021 1238   LDLCALC 94 04/02/2021 1238   LDLDIRECT 92 04/02/2021 1238   LABVLDL 21 04/02/2021 1238    No components found for: NTPROBNP No results for input(s): PROBNP in the last 8760 hours. Recent Labs    12/01/20 0057  TSH 0.965    BMP Recent Labs    12/08/20 0540 12/09/20 0121 12/24/20 1320 04/02/21 1238  NA 135 135 142 141  K 4.8 4.5 4.4 4.7  CL 101 101 105 103  CO2 20* 26 21 22   GLUCOSE 144* 76 167* 126*  BUN 41* 41* 17 16  CREATININE 1.14 1.22 0.88 0.93  CALCIUM 9.3 9.5 9.7 9.9  GFRNONAA >60 >60 95  --   GFRAA  --   --  110  --     HEMOGLOBIN A1C Lab Results  Component Value Date   HGBA1C 8.3 (H) 11/30/2020   MPG 191.51 11/30/2020   Lipid profile: Collected: 08/11/2020 Total cholesterol 189, triglycerides 142, HDL 45, LDL 113, non-HDL 144.  IMPRESSION:    ICD-10-CM   1. Benign hypertension  I10   2. Type 2 diabetes mellitus with other circulatory complication, without  long-term current use of insulin (HCC)  E11.59 Lipid Panel With LDL/HDL Ratio    LDL cholesterol, direct    CMP14+EGFR  3. Mixed hyperlipidemia  E78.2 Lipid Panel With LDL/HDL Ratio    LDL cholesterol, direct    CMP14+EGFR  4. Paroxysmal atrial fibrillation (HCC)  I48.0 metoprolol tartrate (LOPRESSOR) 50 MG tablet  5. Long term (current) use of anticoagulants  Z79.01 Hemoglobin and hematocrit, blood    CANCELED: Hemoglobin and hematocrit, blood     RECOMMENDATIONS: Alejandro Bowen is a 60 y.o. male whose past medical history and cardiac risk  factors include: Hypertension, anxiety, obesity due to excess calories, recent COVID-19 infection, acute hypoxic respiratory failure due to COVID-19 pneumonia, NIDDM, paroxysmal atrial fibrillation.  Atrial fibrillation, paroxysmal: . Rate control: Metoprolol. Marland Kitchen Rhythm control: N/A. Marland Kitchen Thromboembolic prophylaxis: Eliquis . CHA2DS2-VASc SCORE is 2 (HTN, DM) which correlates to 2.9% risk of stroke per year.  Long-term oral anticoagulation:  Indication: Paroxysmal atrial fibrillation.  He does not endorse evidence of bleeding.  Reemphasize the risks, benefits, and alternatives to oral anticoagulation.  Patient would like to transition off of oral anticoagulation.  I reviewed possibly being considered for watchman device.  Patient would like to hold off on such evaluation at this time but will reconsider at the next visit.  Check hemoglobin hematocrit prior to the next visit  Benign essential hypertension:  Office blood pressures have improved.  Home blood pressures have also improved.  Continue losartan.  Scheduled to have sleep study evaluation later this month.  Has lost 5 pounds since last visit.  Low-salt diet recommended  Non-insulin-dependent diabetes mellitus type 2:  Currently managed by primary care provider.  Started on statin therapy given his underlying diabetes and 10-year estimated risk of ASCVD.  He has tolerated statin  therapy without any side effects or intolerances.    Coronary artery calcium score for further risk stratification -pending.  Check fasting lipid profile and CMP prior to the next visit  Obesity, due to excess calories: Body mass index is 30.28 kg/m.  Lost 5 pounds due to lifestyle changes since last visit.  He is congratulated for his efforts. . I reviewed with the patient the importance of diet, regular physical activity/exercise, weight loss.   . Patient is educated on increasing physical activity gradually as tolerated.  With the goal of moderate intensity exercise for 30 minutes a day 5 days a week.  Also reviewed the results of his exercise nuclear stress test with the patient at today's office visit.  Findings noted above for further reference.  FINAL MEDICATION LIST END OF ENCOUNTER: Meds ordered this encounter  Medications  . metoprolol tartrate (LOPRESSOR) 50 MG tablet    Sig: Take 1 tablet (50 mg total) by mouth 2 (two) times daily.    Dispense:  180 tablet    Refill:  0    Medications Discontinued During This Encounter  Medication Reason  . Cyanocobalamin (B-12) 5000 MCG CAPS Error  . metoprolol tartrate (LOPRESSOR) 100 MG tablet Dose change     Current Outpatient Medications:  .  apixaban (ELIQUIS) 5 MG TABS tablet, Take 1 tablet (5 mg total) by mouth 2 (two) times daily., Disp: 60 tablet, Rfl: 0 .  Ascorbic Acid (VITAMIN C) 1000 MG tablet, Take 1,000 mg by mouth daily., Disp: , Rfl:  .  blood glucose meter kit and supplies, Dispense based on patient and insurance preference. Use up to four times daily as directed. (FOR ICD-10 E10.9, E11.9)., Disp: 1 each, Rfl: 0 .  CALCIUM PO, Take 2 tablets by mouth daily., Disp: , Rfl:  .  cholecalciferol (VITAMIN D3) 25 MCG (1000 UNIT) tablet, Take 2,000 Units by mouth daily., Disp: , Rfl:  .  citalopram (CELEXA) 20 MG tablet, Take 20 mg by mouth daily., Disp: , Rfl:  .  glucose blood (FREESTYLE TEST STRIPS) test strip, Use as  instructed, Disp: 100 each, Rfl: 0 .  Insulin Pen Needle 32G X 4 MM MISC, 1 each by Does not apply route as needed., Disp: 200 each, Rfl: 0 .  Lancets (FREESTYLE) lancets, Use  as instructed, Disp: 100 each, Rfl: 0 .  Melatonin 10 MG CAPS, Take 10 mg by mouth at bedtime as needed (sleep)., Disp: , Rfl:  .  metFORMIN (GLUCOPHAGE) 500 MG tablet, Take 1 tablet (500 mg total) by mouth 2 (two) times daily with a meal., Disp: 60 tablet, Rfl: 0 .  metoprolol tartrate (LOPRESSOR) 50 MG tablet, Take 1 tablet (50 mg total) by mouth 2 (two) times daily., Disp: 180 tablet, Rfl: 0 .  Multiple Vitamins-Minerals (ADULT GUMMY PO), Take 2 capsules by mouth daily., Disp: , Rfl:  .  olmesartan (BENICAR) 20 MG tablet, TAKE 1 TABLET BY MOUTH EVERY DAY IN THE EVENING, Disp: 30 tablet, Rfl: 0 .  pantoprazole (PROTONIX) 40 MG tablet, Take 1 tablet (40 mg total) by mouth daily., Disp: 30 tablet, Rfl: 0 .  rosuvastatin (CRESTOR) 20 MG tablet, TAKE 1 TABLET BY MOUTH EVERYDAY AT BEDTIME, Disp: 90 tablet, Rfl: 0 .  zinc gluconate 50 MG tablet, Take 50 mg by mouth daily., Disp: , Rfl:   Orders Placed This Encounter  Procedures  . Lipid Panel With LDL/HDL Ratio  . LDL cholesterol, direct  . CMP14+EGFR  . Hemoglobin and hematocrit, blood   There are no Patient Instructions on file for this visit.   --Continue cardiac medications as reconciled in final medication list. --Return in about 6 months (around 11/07/2021) for Follow up, Lipid, A. fib, BP. Or sooner if needed. --Continue follow-up with your primary care physician regarding the management of your other chronic comorbid conditions.  Patient's questions and concerns were addressed to his satisfaction. He voices understanding of the instructions provided during this encounter.   This note was created using a voice recognition software as a result there may be grammatical errors inadvertently enclosed that do not reflect the nature of this encounter. Every attempt is  made to correct such errors.  Rex Kras, [place], [facility]  Pager: [phone] Office: [phone]

## 2021-05-11 ENCOUNTER — Other Ambulatory Visit: Payer: Self-pay | Admitting: Cardiology

## 2021-05-11 DIAGNOSIS — I10 Essential (primary) hypertension: Secondary | ICD-10-CM

## 2021-05-12 ENCOUNTER — Ambulatory Visit (INDEPENDENT_AMBULATORY_CARE_PROVIDER_SITE_OTHER): Payer: BC Managed Care – PPO | Admitting: Neurology

## 2021-05-12 ENCOUNTER — Encounter: Payer: Self-pay | Admitting: Neurology

## 2021-05-12 VITALS — BP 121/76 | HR 52 | Ht 78.0 in | Wt 260.0 lb

## 2021-05-12 DIAGNOSIS — E669 Obesity, unspecified: Secondary | ICD-10-CM

## 2021-05-12 DIAGNOSIS — E119 Type 2 diabetes mellitus without complications: Secondary | ICD-10-CM | POA: Diagnosis not present

## 2021-05-12 DIAGNOSIS — U071 COVID-19: Secondary | ICD-10-CM | POA: Diagnosis not present

## 2021-05-12 DIAGNOSIS — I480 Paroxysmal atrial fibrillation: Secondary | ICD-10-CM

## 2021-05-12 DIAGNOSIS — G4719 Other hypersomnia: Secondary | ICD-10-CM | POA: Insufficient documentation

## 2021-05-12 DIAGNOSIS — J1282 Pneumonia due to coronavirus disease 2019: Secondary | ICD-10-CM

## 2021-05-12 MED ORDER — ALPRAZOLAM 0.5 MG PO TABS: 0.5000 mg | ORAL_TABLET | Freq: Every evening | ORAL | 0 refills | Status: DC | PRN

## 2021-05-12 NOTE — Patient Instructions (Signed)
Atrial Fibrillation  Atrial fibrillation is a type of irregular or rapid heartbeat (arrhythmia). In atrial fibrillation, the top part of the heart (atria) beats in an irregular pattern. This makes the heart unable to pump blood normally and effectively. The goal of treatment is to prevent blood clots from forming, control your heart rate, or restore your heartbeat to a normal rhythm. If this condition is not treated, it can cause serious problems, such as a weakened heart muscle (cardiomyopathy) or a stroke. What are the causes? This condition is often caused by medical conditions that damage the heart's electrical system. These include:  High blood pressure (hypertension). This is the most common cause.  Certain heart problems or conditions, such as heart failure, coronary artery disease, heart valve problems, or heart surgery.  Diabetes.  Overactive thyroid (hyperthyroidism).  Obesity.  Chronic kidney disease. In some cases, the cause of this condition is not known. What increases the risk? This condition is more likely to develop in:  Older people.  People who smoke.  Athletes who do endurance exercise.  People who have a family history of atrial fibrillation.  Men.  People who use drugs.  People who drink a lot of alcohol.  People who have lung conditions, such as emphysema, pneumonia, or COPD.  People who have obstructive sleep apnea. What are the signs or symptoms? Symptoms of this condition include:  A feeling that your heart is racing or beating irregularly.  Discomfort or pain in your chest.  Shortness of breath.  Sudden light-headedness or weakness.  Tiring easily during exercise or activity.  Fatigue.  Syncope (fainting).  Sweating. In some cases, there are no symptoms. How is this diagnosed? Your health care provider may detect atrial fibrillation when taking your pulse. If detected, this condition may be diagnosed with:  An electrocardiogram  (ECG) to check electrical signals of the heart.  An ambulatory cardiac monitor to record your heart's activity for a few days.  A transthoracic echocardiogram (TTE) to create pictures of your heart.  A transesophageal echocardiogram (TEE) to create even closer pictures of your heart.  A stress test to check your blood supply while you exercise.  Imaging tests, such as a CT scan or chest X-ray.  Blood tests. How is this treated? Treatment depends on underlying conditions and how you feel when you experience atrial fibrillation. This condition may be treated with:  Medicines to prevent blood clots or to treat heart rate or heart rhythm problems.  Electrical cardioversion to reset the heart's rhythm.  A pacemaker to correct abnormal heart rhythm.  Ablation to remove the heart tissue that sends abnormal signals.  Left atrial appendage closure to seal the area where blood clots can form. In some cases, underlying conditions will be treated. Follow these instructions at home: Medicines  Take over-the counter and prescription medicines only as told by your health care provider.  Do not take any new medicines without talking to your health care provider.  If you are taking blood thinners: ? Talk with your health care provider before you take any medicines that contain aspirin or NSAIDs, such as ibuprofen. These medicines increase your risk for dangerous bleeding. ? Take your medicine exactly as told, at the same time every day. ? Avoid activities that could cause injury or bruising, and follow instructions about how to prevent falls. ? Wear a medical alert bracelet or carry a card that lists what medicines you take. Lifestyle  Do not use any products that contain nicotine or  tobacco, such as cigarettes, e-cigarettes, and chewing tobacco. If you need help quitting, ask your health care provider.  Eat heart-healthy foods. Talk with a dietitian to make an eating plan that is right for  you.  Exercise regularly as told by your health care provider.  Do not drink alcohol.  Lose weight if you are overweight.  Do not use drugs, including cannabis.      General instructions  If you have obstructive sleep apnea, manage your condition as told by your health care provider.  Do not use diet pills unless your health care provider approves. Diet pills can make heart problems worse.  Keep all follow-up visits as told by your health care provider. This is important. Contact a health care provider if you:  Notice a change in the rate, rhythm, or strength of your heartbeat.  Are taking a blood thinner and you notice more bruising.  Tire more easily when you exercise or do heavy work.  Have a sudden change in weight. Get help right away if you have:  Chest pain, abdominal pain, sweating, or weakness.  Trouble breathing.  Side effects of blood thinners, such as blood in your vomit, stool, or urine, or bleeding that cannot stop.  Any symptoms of a stroke. "BE FAST" is an easy way to remember the main warning signs of a stroke: ? B - Balance. Signs are dizziness, sudden trouble walking, or loss of balance. ? E - Eyes. Signs are trouble seeing or a sudden change in vision. ? F - Face. Signs are sudden weakness or numbness of the face, or the face or eyelid drooping on one side. ? A - Arms. Signs are weakness or numbness in an arm. This happens suddenly and usually on one side of the body. ? S - Speech. Signs are sudden trouble speaking, slurred speech, or trouble understanding what people say. ? T - Time. Time to call emergency services. Write down what time symptoms started.  Other signs of a stroke, such as: ? A sudden, severe headache with no known cause. ? Nausea or vomiting. ? Seizure. These symptoms may represent a serious problem that is an emergency. Do not wait to see if the symptoms will go away. Get medical help right away. Call your local emergency services  (911 in the [place] Do not drive yourself to the hospital.   Summary  Atrial fibrillation is a type of irregular or rapid heartbeat (arrhythmia).  Symptoms include a feeling that your heart is beating fast or irregularly.  You may be given medicines to prevent blood clots or to treat heart rate or heart rhythm problems.  Get help right away if you have signs or symptoms of a stroke.  Get help right away if you cannot catch your breath or have chest pain or pressure. This information is not intended to replace advice given to you by your health care provider. Make sure you discuss any questions you have with your health care provider. Document Revised: 06/05/2019 Document Reviewed: 06/05/2019 Elsevier Patient Education  2021 [employer] Inc[employer] Screening for Sleep Apnea  Sleep apnea is a condition in which breathing pauses or becomes shallow during sleep. Sleep apnea screening is a test to determine if you are at risk for sleep apnea. The test is easy and only takes a few minutes. Your health care provider may ask you to have this test in preparation for surgery or as part of a physical exam. What are the symptoms of sleep apnea? Common symptoms  of sleep apnea include:  Snoring.  Restless sleep.  Daytime sleepiness.  Pauses in breathing.  Choking during sleep.  Irritability.  Forgetfulness.  Trouble thinking clearly.  Depression.  Personality changes. Most people with sleep apnea are not aware that they have it. Why should I get screened? Getting screened for sleep apnea can help:  Ensure your safety. It is important for your health care providers to know whether or not you have sleep apnea, especially if you are having surgery or have other long-term (chronic) health conditions.  Improve your health and allow you to get a better night's rest. Restful sleep can help you: ? Have more energy. ? Lose weight. ? Improve high blood pressure. ? Improve diabetes  management. ? Prevent stroke. ? Prevent car accidents. How is screening done? Screening usually includes being asked a list of questions about your sleep quality. Some questions you may be asked include:  Do you snore?  Is your sleep restless?  Do you have daytime sleepiness?  Has a partner or spouse told you that you stop breathing during sleep?  Have you had trouble concentrating or memory loss? If your screening test is positive, you are at risk for the condition. Further testing may be needed to confirm a diagnosis of sleep apnea. Where to find more information You can find screening tools online or at your health care clinic. For more information about sleep apnea screening and healthy sleep, visit these websites:  Centers for Disease Control and Prevention: Detail[employer].is  American Sleep Apnea Association: www.sleepapnea.org Contact a health care provider if:  You think that you may have sleep apnea. Summary  Sleep apnea screening can help determine if you are at risk for sleep apnea.  It is important for your health care providers to know whether or not you have sleep apnea, especially if you are having surgery or have other chronic health conditions.  You may be asked to take a screening test for sleep apnea in preparation for surgery or as part of a physical exam. This information is not intended to replace advice given to you by your health care provider. Make sure you discuss any questions you have with your health care provider. Document Revised: 09/28/2018 Document Reviewed: 03/24/2017 Elsevier Patient Education  2021 [employer].

## 2021-05-12 NOTE — Addendum Note (Signed)
Addended by: Melvyn Novas on: 05/12/2021 09:53 AM   Modules accepted: Orders

## 2021-05-12 NOTE — Progress Notes (Signed)
SLEEP MEDICINE CLINIC    Provider:  Larey Seat, MD  Primary Care Physician:  Christain Sacramento, MD [address] [place] [address]     Referring Provider: Dr. Rex Kras, DO       Chief Complaint according to patient   Patient presents with:    . New Patient (Initial Visit)     Pt with wife, rm 44. Pt was referred by cardiology. His wife states that she has witnessed apnea events and that he snores.  Pt has never had a SS. States he sleeps avg of 6 hrs but its broken/restless. When he wakes up still feels tired. New diagnosis of Afib after COVID 19 around thanksgiving. .  Cardioversion was not needed.       HISTORY OF PRESENT ILLNESS:  Alejandro Bowen is a 60 y.o. year old Caucasian male patient seen here as a referral on 05/12/2021 from Dr Terri Skains.  Chief concern according to patient :  in and out of a fib after COVID 19 and AB treatment.    I have the pleasure of seeing Alejandro Bowen today, a right -handed White or Caucasian male with a possible sleep disorder.  She has a  has a past medical history of Anxiety, Atrial fibrillation ([place]), Diabetes mellitus without complication ([place]), History of COVID-19, and Hypertension.. CXR after his hospitalization from [place] pneumonia documented he continued to improve. His rest oxygen levels are 97%. He has some shortness of breath with climbing a hill. Has minor chest pain with deep breath. Overall, he is improving since I last saw him. He has no cough phlegm production  Original films were done at an outside hospital and not available for comparison. Possible this actually represents interval improvement from his original CXR. He will call me with worsening symptoms for a ASAP appointment. He has plans to follow up for routine visit in 03/08/21 . Lanelle Bal, PA-C, 01/05/21 1800   Sleep relevant medical history: Nocturia/- none ,, cervical spine or ENT surgery, 2 years ago DM diagnosis. Obesity.    Family medical /sleep history:  father died at 26 of MI, mother of Alzheimer's dementia, DM, other family member on CPAP with OSA, insomnia, sleep walkers.    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 Marland Kitchen  and lives in a household with spouse, The patient currently works day time.  Tobacco use- none .  ETOH use ; seldom , Caffeine intake in form of Coffee( 1 mug) .Regular exercise in form of walking..   Sleep habits are as follows: The patient's dinner time is between 5-7 PM. The patient goes to bed at 10-10.30 PM and continues to sleep for 5 hours, wakes because the beagle wakes him. He leave for work at 6 AM, rises at 5 AM.  He often has anxiety that interferes with sleep onset.  The preferred sleep position is sideways, with the support of 2 pillows.  Dreams are reportedly frequent/vivid. 5 AM is the usual rise time. The patient wakes up spontaneously.  He reports not feeling refreshed or restored in AM, with symptoms such as dry mouth and residual fatigue. Naps are taken infrequently, lasting from 60-120 minutes and are more refreshing than nocturnal sleep.    Review of Systems: Out of a complete 14 system review, the patient complains of only the following symptoms, and all other reviewed systems are negative.:  Fatigue, sleepiness , snoring, fragmented sleep,  Insomnia when under stress. Vivid reams.    How likely are you to doze in the following situations: 0 = not likely, 1 = slight chance, 2 = moderate chance, 3 = high chance   Sitting and Reading? Watching Television? Sitting inactive in a public place (theater or meeting)? As a passenger in a car for an hour without a break? Lying down in the afternoon when circumstances permit? Sitting and talking to someone? Sitting quietly after lunch without alcohol? In a car, while stopped for a few minutes in traffic?   Total = 12/ 24 points   FSS endorsed at 30/ 63 points.   Social History   Socioeconomic  History  . Marital status: Married    Spouse name: Not on file  . Number of children: Not on file  . Years of education: Not on file  . Highest education level: Not on file  Occupational History  . Not on file  Tobacco Use  . Smoking status: Never Smoker  . Smokeless tobacco: Never Used  Substance and Sexual Activity  . Alcohol use: No  . Drug use: No  . Sexual activity: Not on file  Other Topics Concern  . Not on file  Social History Narrative  . Not on file   Social Determinants of Health   Financial Resource Strain: Not on file  Food Insecurity: Not on file  Transportation Needs: Not on file  Physical Activity: Not on file  Stress: Not on file  Social Connections: Not on file    Family History  Problem Relation Age of Onset  . Dementia Mother   . Heart disease Father     Past Medical History:  Diagnosis Date  . Anxiety   . Atrial fibrillation ([place])   . Diabetes mellitus without complication ([place])   . History of COVID-13 Dec 2020- pneumonia, and hospitalization, followed by atrial fib.    Marland Kitchen Hypertension     No past surgical history on file.   Current Outpatient Medications on File Prior to Visit  Medication Sig Dispense Refill  . apixaban (ELIQUIS) 5 MG TABS tablet Take 1 tablet (5 mg total) by mouth 2 (two) times daily. 60 tablet 0  . Ascorbic Acid (VITAMIN C) 1000 MG tablet Take 1,000 mg by mouth daily.    . blood glucose meter kit and supplies Dispense based on patient and insurance preference. Use up to four times daily as directed. (FOR ICD-10 E10.9, E11.9). 1 each 0  . CALCIUM PO Take 2 tablets by mouth daily.    . cholecalciferol (VITAMIN D3) 25 MCG (1000 UNIT) tablet Take 2,000 Units by mouth daily.    . citalopram (CELEXA) 20 MG tablet Take 20 mg by mouth daily.    Marland Kitchen glucose blood (FREESTYLE TEST STRIPS) test strip Use as instructed 100 each 0  . Insulin Pen Needle 32G X 4 MM MISC 1 each by Does not apply route as needed. 200 each 0  . Lancets  (FREESTYLE) lancets Use as instructed 100 each 0  . Melatonin 10 MG CAPS Take 10 mg by mouth at bedtime as needed (sleep).    . metFORMIN (GLUCOPHAGE) 500 MG tablet Take 1 tablet (500 mg total) by mouth 2 (two) times daily with a meal. 60 tablet 0  . metoprolol tartrate (LOPRESSOR) 50 MG tablet Take 1 tablet (50 mg total) by mouth 2 (two) times daily. 180 tablet 0  . Multiple Vitamins-Minerals (ADULT GUMMY PO) Take 2 capsules by mouth daily.    Marland Kitchen  olmesartan (BENICAR) 20 MG tablet TAKE 1 TABLET BY MOUTH EVERY DAY IN THE EVENING 30 tablet 6  . pantoprazole (PROTONIX) 40 MG tablet Take 1 tablet (40 mg total) by mouth daily. 30 tablet 0  . rosuvastatin (CRESTOR) 20 MG tablet TAKE 1 TABLET BY MOUTH EVERYDAY AT BEDTIME 90 tablet 0  . zinc gluconate 50 MG tablet Take 50 mg by mouth daily.     No current facility-administered medications on file prior to visit.    Allergies  Allergen Reactions  . Penicillins Hives  . Alprazolam Other (See Comments)    depression    Physical exam:  Today's Vitals   05/12/21 0852  BP: 121/76  Pulse: (!) 52  Weight: 260 lb (117.9 kg)  Height: 6' 6" (1.981 m)   Body mass index is 30.05 kg/m.   Wt Readings from Last 3 Encounters:  05/12/21 260 lb (117.9 kg)  05/07/21 262 lb (118.8 kg)  03/25/21 267 lb 6.4 oz (121.3 kg)     Ht Readings from Last 3 Encounters:  05/12/21 6' 6" (1.981 m)  05/07/21 6' 6" (1.981 m)  03/25/21 6' 6" (1.981 m)      General: The patient is awake, alert and appears not in acute distress. The patient is well groomed. Head: Normocephalic, atraumatic. Neck is supple.  Mallampati 3,  neck circumference:19 inches . Nasal airflow  patent.  Retrognathia is seen.  Dental status: biologic Cardiovascular:  Regular rate and cardiac rhythm by pulse,  without distended neck veins. Respiratory: Lungs are clear to auscultation.  Skin:  Without evidence of ankle edema, or rash. Trunk: The patient's posture is erect.   Neurologic exam  : The patient is awake and alert, oriented to place and time.   Memory subjective described as intact.  Attention span & concentration ability appears normal.  Speech is fluent,  without  dysarthria, dysphonia or aphasia.  Mood and affect are appropriate.   Cranial nerves: no loss of smell or taste reported  Pupils are equal and briskly reactive to light. Funduscopic exam deferred. .  Extraocular movements in vertical and horizontal planes were intact and without nystagmus. No Diplopia. Visual fields by finger perimetry are intact. Hearing was intact to soft voice and finger rubbing.    Facial sensation intact to fine touch.  Facial motor strength is symmetric and tongue and uvula move midline.  Neck ROM : rotation, tilt and flexion extension were normal for age and shoulder shrug was symmetrical.    Motor exam:  Symmetric bulk, tone and ROM.   Normal tone without cog wheeling, symmetric grip strength .   Sensory:  Fine touch, pinprick and vibration were normal.  Proprioception tested in the upper extremities was normal.   Coordination: Rapid alternating movements in the fingers/hands were of normal speed.  The Finger-to-nose maneuver was intact without evidence of ataxia, dysmetria or tremor.   Gait and station: Patient could rise unassisted from a seated position, walked without assistive device.  Stance is of normal width/ base and the patient turned with 3 steps.  Toe and heel walk were deferred.  Deep tendon reflexes: in the  upper and lower extremities are symmetric and intact.  Babinski response was deferred.     Paroxysmal atrial fibrillation-neurology Mr. Tomasetti was diagnosed with COVID-19 in late November 2021 and developed worsening symptoms he was hospitalized in November 27, 2020 and stayed in hospital on through 15 December.  During his hospitalization he developed acute hypoxic respiratory failure with COVID-19 pneumonia and  during that hospitalization he was also found  to be in atrial fibrillation for the first time.  He had a rapid ventricular rate almost 200 bpm.  He was started on AV nodal blocking agent and oral anticoagulation to prevent strokes.  At the office visit following his hospitalization it was decided to proceed with a TEE cardioversion the procedure was scheduled for January however he had already converted spontaneously to normal sinus rhythm and therefore never underwent cardioversion.  His blood pressure has been well controlled now he has lost a significant amount of weight following his COVID infection about 14 pounds.  He remains on Eliquis as a anticoagulant, vitamin B12 vitamin D citalopram vitamin C he is now taking metoprolol for rate control 100 mg 2 times daily he continues on metformin 500 mg and on Protonix to protect him from ulcers.  His echocardiogram was performed on 12-01-2020 and showed a normal mitral valve aortic valve and normal inferior vena cava, right ventricular systolic function was normal size was normal left ventricular ejection fraction was 60 to 65% which is normal.  Laboratory results White blood cell count was high during this hospitalization as expected and return to normal in January.  Normal thyroid function he did have elevated liver function tests and his last exam AST was 55 and ALT was 101.  Given the history of paroxysmal atrial fibrillation following a COVID-19 infection with pneumonia we will order either an attended sleep study or a home sleep test to screen for sleep apnea.  Sleep apnea following a viral infection And pneumonia is not an unusual finding causing atrophic.  The patient will be advised when the procedure will be performed before staff to assure that his insurance will cover the test.  I will follow-up with him in 2 to 3 months after the test but he will get the results by phone earlier.   After spending a total time of 50 minutes face to face and additional time for physical and neurologic  examination, review of laboratory studies,  personal review of imaging studies, reports and results of other testing and review of referral information / records as far as provided in visit, I have established the following assessments:     My Plan is to proceed with:  1) attended sleep study post atrial fibrillation, COVID 19.  Plan B is HST.   I would like to thank Dr Terri Skains and Christain Sacramento, Md [address] [place],  [place] [address] for allowing me to meet with and to take care of this pleasant patient.   In short, Alejandro Bowen is presenting with paroxysmal atrial fibrillation following COVID 19 pneumonia.  I plan to follow up either personally or through our NP.Marland Kitchen    Electronically signed by: Larey Seat, MD 05/12/2021 9:17 AM  [facility] Board certified by The [employer] of Sleep Medicine and Diplomate of the [employer] of Sleep Medicine. Board certified In Neurology through the [place], Fellow of the [employer] of Neurology. Medical Director of [facility].

## 2021-06-03 LAB — LIPID PANEL WITH LDL/HDL RATIO
Cholesterol, Total: 110 mg/dL (ref 100–199)
HDL: 46 mg/dL (ref >39)
LDL Chol Calc (NIH): 43 mg/dL (ref 0–99)
LDL/HDL Ratio: 0.9 ratio (ref 0.0–3.6)
Triglycerides: 113 mg/dL (ref 0–149)
VLDL Cholesterol Cal: 21 mg/dL (ref 5–40)

## 2021-06-03 LAB — MAGNESIUM: Magnesium: 2.1 mg/dL (ref 1.6–2.3)

## 2021-06-03 LAB — LDL CHOLESTEROL, DIRECT: LDL Direct: 41 mg/dL (ref 0–99)

## 2021-06-04 ENCOUNTER — Ambulatory Visit
Admission: RE | Admit: 2021-06-04 | Discharge: 2021-06-04 | Disposition: A | Discharge status: Home / Self Care | Payer: BC Managed Care – PPO | Source: Ambulatory Visit | Attending: Cardiology | Admitting: Cardiology

## 2021-06-04 DIAGNOSIS — E1159 Type 2 diabetes mellitus with other circulatory complications: Secondary | ICD-10-CM

## 2021-06-14 NOTE — Progress Notes (Signed)
Called pt to inform him about his lab results. Pt understood

## 2021-07-27 ENCOUNTER — Other Ambulatory Visit: Payer: Self-pay | Admitting: Cardiology

## 2021-07-27 DIAGNOSIS — I480 Paroxysmal atrial fibrillation: Secondary | ICD-10-CM

## 2021-07-27 DIAGNOSIS — E1159 Type 2 diabetes mellitus with other circulatory complications: Secondary | ICD-10-CM

## 2021-11-03 ENCOUNTER — Telehealth: Payer: Self-pay | Admitting: Cardiology

## 2021-11-03 ENCOUNTER — Other Ambulatory Visit: Payer: Self-pay

## 2021-11-03 DIAGNOSIS — I480 Paroxysmal atrial fibrillation: Secondary | ICD-10-CM

## 2021-11-03 MED ORDER — METOPROLOL TARTRATE 50 MG PO TABS: 50.0000 mg | ORAL_TABLET | Freq: Two times a day (BID) | ORAL | 0 refills | Status: DC

## 2021-11-03 NOTE — Telephone Encounter (Signed)
Done

## 2021-11-03 NOTE — Telephone Encounter (Signed)
REF REQ FOR BELOW MEDICATION  metoprolol tartrate (LOPRESSOR) 50 MG tablet  [id number] - [place], [place] - [address]  [phone]

## 2021-11-08 ENCOUNTER — Encounter: Payer: Self-pay | Admitting: Cardiology

## 2021-11-08 ENCOUNTER — Other Ambulatory Visit: Payer: Self-pay

## 2021-11-08 ENCOUNTER — Ambulatory Visit: Payer: BC Managed Care – PPO | Admitting: Cardiology

## 2021-11-08 VITALS — BP 133/74 | HR 54 | Temp 98.0°F | Resp 16 | Ht 78.0 in | Wt 264.0 lb

## 2021-11-08 DIAGNOSIS — E782 Mixed hyperlipidemia: Secondary | ICD-10-CM

## 2021-11-08 DIAGNOSIS — E1159 Type 2 diabetes mellitus with other circulatory complications: Secondary | ICD-10-CM

## 2021-11-08 DIAGNOSIS — I10 Essential (primary) hypertension: Secondary | ICD-10-CM

## 2021-11-08 DIAGNOSIS — I480 Paroxysmal atrial fibrillation: Secondary | ICD-10-CM

## 2021-11-08 DIAGNOSIS — Z7901 Long term (current) use of anticoagulants: Secondary | ICD-10-CM

## 2021-11-08 NOTE — Progress Notes (Signed)
Date:  11/08/2021   ID:  Alejandro Bowen, DOB [birth year], MRN [id number]  PCP:  Christain Sacramento, MD  Cardiologist: Rex Kras, DO, [facility] (established care 12/24/2020)  Date: 11/08/21 Last Office Visit: 05/07/2021   Chief Complaint  Patient presents with   Hypertension   Atrial Fibrillation   Hyperlipidemia    HPI  Alejandro Bowen is a 60 y.o. male who presents to the office with a chief complaint of "68-monthfollow-up for A. fib, hypertension, hyperlipidemia management." Patient's past medical history and cardiovascular risk factors include: Hypertension, non-insulin-dependent diabetes mellitus type 2, hyperlipidemia anxiety, obesity due to excess calories, recent COVID-19 infection, acute hypoxic respiratory failure due to COVID-19 pneumonia, paroxysmal atrial fibrillation.  He is referred to the office at the request of WChristain Sacramento MD for evaluation of management of atrial fibrillation.  Patient was diagnosed with COVID-19 pneumonia in November 2021 and was hospitalized due to hypoxemia and COVID-pneumonia.  During that hospitalization he was noted to have new onset of A. fib with RVR and started on AV nodal blocking agents as well as oral anticoagulation for thromboembolic prophylaxis.  Soon thereafter he establish care with the practice in December 2021 and his AV nodal blocking agents were uptitrated.  However due to him being in persistent atrial fibrillation the shared decision was to proceed with cardioversion.  However, patient's cardioversion was aborted as he spontaneously converted to normal sinus.  During follow-up visits patient and his wife requested my assistance with the management of hypertension and hyperlipidemia.  Patient's medications have been uptitrated and now presents for 662-monthollow-up visit.  Patient is doing well from a cardiovascular standpoint.  He denies any angina pectoris or heart failure symptoms.  Home blood pressure logs reviewed.  Overall within  acceptable range; however, morning blood pressures are higher than the evening.  Patient has been evaluated by sleep medicine but still has not undergone sleep study.  Patient is still considering discontinuation of oral anticoagulation because he does not want to be on blood thinner w/o particular reason.   FUNCTIONAL STATUS: No structured exercise program or daily routine. But is active with his job duties.   ALLERGIES: Allergies  Allergen Reactions   Penicillins Hives   Alprazolam Other (See Comments)    depression    MEDICATION LIST PRIOR TO VISIT: Current Meds  Medication Sig   apixaban (ELIQUIS) 5 MG TABS tablet Take 1 tablet (5 mg total) by mouth 2 (two) times daily.   Ascorbic Acid (VITAMIN C) 1000 MG tablet Take 1,000 mg by mouth daily.   blood glucose meter kit and supplies Dispense based on patient and insurance preference. Use up to four times daily as directed. (FOR ICD-10 E10.9, E11.9).   cholecalciferol (VITAMIN D3) 25 MCG (1000 UNIT) tablet Take 2,000 Units by mouth daily.   citalopram (CELEXA) 20 MG tablet Take 20 mg by mouth daily.   glucose blood (FREESTYLE TEST STRIPS) test strip Use as instructed   Insulin Pen Needle 32G X 4 MM MISC 1 each by Does not apply route as needed.   Lancets (FREESTYLE) lancets Use as instructed   Melatonin 10 MG CAPS Take 10 mg by mouth at bedtime as needed (sleep).   metFORMIN (GLUCOPHAGE) 500 MG tablet Take 1 tablet (500 mg total) by mouth 2 (two) times daily with a meal.   metoprolol tartrate (LOPRESSOR) 50 MG tablet Take 1 tablet (50 mg total) by mouth 2 (two) times daily.   Multiple Vitamins-Minerals (ADULT GUMMY PO) Take 2  capsules by mouth daily.   olmesartan (BENICAR) 20 MG tablet TAKE 1 TABLET BY MOUTH EVERY DAY IN THE EVENING   rosuvastatin (CRESTOR) 20 MG tablet TAKE 1 TABLET BY MOUTH EVERYDAY AT BEDTIME   zinc gluconate 50 MG tablet Take 50 mg by mouth daily.     PAST MEDICAL HISTORY: Past Medical History:  Diagnosis Date    Anxiety    Atrial fibrillation ([place])    Diabetes mellitus without complication ([place])    History of COVID-19    Hypertension     PAST SURGICAL HISTORY: History reviewed. No pertinent surgical history.  FAMILY HISTORY: The patient family history includes Dementia in his mother; Heart disease in his father; Hypertension in his brother.  SOCIAL HISTORY:  The patient  reports that he has never smoked. He has never used smokeless tobacco. He reports current alcohol use. He reports that he does not use drugs.  REVIEW OF SYSTEMS: Review of Systems  Constitutional: Negative for chills and fever.  HENT:  Negative for hoarse voice and nosebleeds.   Eyes:  Negative for discharge, double vision and pain.  Cardiovascular:  Negative for chest pain, claudication, dyspnea on exertion, leg swelling, near-syncope, orthopnea, palpitations, paroxysmal nocturnal dyspnea and syncope.  Respiratory:  Positive for snoring. Negative for hemoptysis and shortness of breath.   Musculoskeletal:  Negative for muscle cramps and myalgias.  Gastrointestinal:  Negative for abdominal pain, constipation, diarrhea, hematemesis, hematochezia, melena, nausea and vomiting.  Neurological:  Negative for dizziness and light-headedness.   PHYSICAL EXAM: Vitals with BMI 11/08/2021 05/12/2021 05/07/2021  Height 6' 6"  6' 6"  6' 6"   Weight 264 lbs 260 lbs 262 lbs  BMI 30.51 99.24 26.83  Systolic 419 622 297  Diastolic 74 76 72  Pulse 54 52 65    CONSTITUTIONAL: Well-developed and well-nourished. No acute distress.  SKIN: Skin is warm and dry. No rash noted. No cyanosis. No pallor. No jaundice HEAD: Normocephalic and atraumatic.  EYES: No scleral icterus MOUTH/THROAT: Moist oral membranes.  NECK: Unable to evaluate JVP due to a short neck with adipose tissue. No thyromegaly noted. No carotid bruits  LYMPHATIC: No visible cervical adenopathy.  CHEST Normal respiratory effort. No intercostal retractions  LUNGS: Clear to  auscultation bilaterally.  No stridor. No wheezes. No rales.  CARDIOVASCULAR: Regular, positive S1-S2, no murmurs rubs or gallops appreciated ABDOMINAL: Obese, soft, nontender, nondistended, positive bowel sounds in all 4 quadrants, no apparent ascites.  EXTREMITIES: No peripheral edema  HEMATOLOGIC: No significant bruising NEUROLOGIC: Oriented to person, place, and time. Nonfocal. Normal muscle tone.  PSYCHIATRIC: Normal mood and affect. Normal behavior. Cooperative  CARDIAC DATABASE: EKG: 11/08/2021: Sinus bradycardia, 54 bpm, normal axis, without underlying ischemia or injury pattern.  Echocardiogram: 12/01/2020: LVEF 60-65%, no regional wall motion abnormalities, no significant valvular abnormality.  Stress Testing: Exercise Sestamibi stress test 04/05/2021: Exercise nuclear stress test was performed using Bruce protocol. Patient reached 7 METS, and 87% of age predicted maximum heart rate. Exercise capacity was low. No chest pain reported. Heart rate and hemodynamic response were normal. Stress EKG revealed no ischemic changes. Normal stress myocardial perfusion. Stress LVEF 52%. Low risk study.  Heart Catheterization: Exercise Sestamibi stress test 04/05/2021: Exercise nuclear stress test was performed using Bruce protocol. Patient reached 7 METS, and 87% of age predicted maximum heart rate. Exercise capacity was low. No chest pain reported. Heart rate and hemodynamic response were normal. Stress EKG revealed no ischemic changes. Normal stress myocardial perfusion. Stress LVEF 52%. Low risk study.  Coronary calcium  score 06/04/2021: 1. Coronary calcium score of 0. 2. Resolution of acute COVID-19 pneumonia related infiltrates with prominent residual bandlike areas of peripheral scarring in both lungs. There may be some mild associated bronchiectasis due to traction in the lower lobes bilaterally. No overt fibrosis is identified. Correlation suggested with any chronic respiratory  symptoms following prior COVID pneumonia.  LABORATORY DATA: CBC Latest Ref Rng & Units 12/24/2020 12/09/2020 12/08/2020  WBC 3.4 - 10.8 x10E3/uL 5.5 20.7(H) 16.3(H)  Hemoglobin 13.0 - 17.7 g/dL 13.9 16.2 15.8  Hematocrit 37.5 - 51.0 % 41.1 48.0 46.7  Platelets 150 - 450 x10E3/uL 221 243 263    CMP Latest Ref Rng & Units 04/02/2021 12/24/2020 12/09/2020  Glucose 65 - 99 mg/dL 126(H) 167(H) 76  BUN 6 - 24 mg/dL 16 17 41(H)  Creatinine 0.76 - 1.27 mg/dL 0.93 0.88 1.22  Sodium 134 - 144 mmol/L 141 142 135  Potassium 3.5 - 5.2 mmol/L 4.7 4.4 4.5  Chloride 96 - 106 mmol/L 103 105 101  CO2 20 - 29 mmol/L 22 21 26   Calcium 8.7 - 10.2 mg/dL 9.9 9.7 9.5  Total Protein 6.0 - 8.5 g/dL 6.9 6.4 6.2(L)  Total Bilirubin 0.0 - 1.2 mg/dL 0.3 0.3 1.0  Alkaline Phos 44 - 121 IU/L 95 87 67  AST 0 - 40 IU/L 50(H) 55(H) 44(H)  ALT 0 - 44 IU/L 76(H) 101(H) 102(H)   Lipid Panel     Component Value Date/Time   CHOL 110 06/02/2021 1308   TRIG 113 06/02/2021 1308   HDL 46 06/02/2021 1308   LDLCALC 43 06/02/2021 1308   LDLDIRECT 41 06/02/2021 1309   LABVLDL 21 06/02/2021 1308    No components found for: NTPROBNP No results for input(s): PROBNP in the last 8760 hours. Recent Labs    12/01/20 0057  TSH 0.965    BMP Recent Labs    12/08/20 0540 12/09/20 0121 12/24/20 1320 04/02/21 1238  NA 135 135 142 141  K 4.8 4.5 4.4 4.7  CL 101 101 105 103  CO2 20* 26 21 22   GLUCOSE 144* 76 167* 126*  BUN 41* 41* 17 16  CREATININE 1.14 1.22 0.88 0.93  CALCIUM 9.3 9.5 9.7 9.9  GFRNONAA >60 >60 95  --   GFRAA  --   --  110  --     HEMOGLOBIN A1C Lab Results  Component Value Date   HGBA1C 8.3 (H) 11/30/2020   MPG 191.51 11/30/2020   Lipid profile: Collected: 08/11/2020 Total cholesterol 189, triglycerides 142, HDL 45, LDL 113, non-HDL 144.  IMPRESSION:    ICD-10-CM   1. Paroxysmal atrial fibrillation (HCC)  I48.0     2. Long term (current) use of anticoagulants  Z79.01     3. Benign  hypertension  I10 EKG 12-Lead    4. Type 2 diabetes mellitus with other circulatory complication, without long-term current use of insulin (HCC)  E11.59     5. Mixed hyperlipidemia  E78.2        RECOMMENDATIONS: Alejandro Bowen is a 60 y.o. male whose past medical history and cardiac risk factors include: Hypertension, anxiety, obesity due to excess calories, recent COVID-19 infection, acute hypoxic respiratory failure due to COVID-19 pneumonia, NIDDM, paroxysmal atrial fibrillation.  Atrial fibrillation, paroxysmal: Rate control: Metoprolol. Rhythm control: N/A. Thromboembolic prophylaxis: Eliquis CHA2DS2-VASc SCORE is 2 (HTN, DM) which correlates to 2.9% risk of stroke per year.  Long-term oral anticoagulation: Indication: Paroxysmal atrial fibrillation. He does not endorse evidence of bleeding. Reemphasize the risks,  benefits, and alternatives to oral anticoagulation. Patient would like to transition off of oral anticoagulation. We discussed 2 options.  Option #1 referral to Dr. Sherren Mocha for evaluation for possible watchman evaluation and subsequently coming off of oral anticoagulation as clinically appropriate.  Option #2 considering loop recorder implantation and continue oral anticoagulation for the first 30 days.  And to reconsider reinitiation of anticoagulation if atrial fibrillation is further detected.  Patient would like to consider both of these options little more at length prior to making his decision.  We will follow-up at the next office visit; however, for now he will continue oral anticoagulation.  Benign essential hypertension: Office blood pressures are within acceptable range. Blood pressure log reviewed.  Morning blood pressure usually elevated compared to evening. Recommend proceeding with sleep study as originally scheduled/planned.  Reached out to Dr. Edwena Felty office to have this looked into a little bit more in detail and the patient states that he has not  heard back. Reemphasized importance of low-salt diet Reemphasized increasing physical activity with a goal of 30 minutes a day 5 days a week.  Non-insulin-dependent diabetes mellitus type 2: Currently managed by primary care provider. Started on statin therapy given his estimated 10-year risk of ASCVD and underlying diabetes.  Patient is most recent LDL levels reviewed and currently at goal. Patient does not endorse any evidence of myalgias. Continue for now.  Obesity, due to excess calories: Body mass index is 30.51 kg/m.  I reviewed with the patient the importance of diet, regular physical activity/exercise, weight loss.   Patient is educated on increasing physical activity gradually as tolerated.  With the goal of moderate intensity exercise for 30 minutes a day 5 days a week.   FINAL MEDICATION LIST END OF ENCOUNTER: No orders of the defined types were placed in this encounter.   Medications Discontinued During This Encounter  Medication Reason   ALPRAZolam (XANAX) 0.5 MG tablet Error   CALCIUM PO Error   pantoprazole (PROTONIX) 40 MG tablet Error     Current Outpatient Medications:    apixaban (ELIQUIS) 5 MG TABS tablet, Take 1 tablet (5 mg total) by mouth 2 (two) times daily., Disp: 60 tablet, Rfl: 0   Ascorbic Acid (VITAMIN C) 1000 MG tablet, Take 1,000 mg by mouth daily., Disp: , Rfl:    blood glucose meter kit and supplies, Dispense based on patient and insurance preference. Use up to four times daily as directed. (FOR ICD-10 E10.9, E11.9)., Disp: 1 each, Rfl: 0   cholecalciferol (VITAMIN D3) 25 MCG (1000 UNIT) tablet, Take 2,000 Units by mouth daily., Disp: , Rfl:    citalopram (CELEXA) 20 MG tablet, Take 20 mg by mouth daily., Disp: , Rfl:    glucose blood (FREESTYLE TEST STRIPS) test strip, Use as instructed, Disp: 100 each, Rfl: 0   Insulin Pen Needle 32G X 4 MM MISC, 1 each by Does not apply route as needed., Disp: 200 each, Rfl: 0   Lancets (FREESTYLE) lancets, Use  as instructed, Disp: 100 each, Rfl: 0   Melatonin 10 MG CAPS, Take 10 mg by mouth at bedtime as needed (sleep)., Disp: , Rfl:    metFORMIN (GLUCOPHAGE) 500 MG tablet, Take 1 tablet (500 mg total) by mouth 2 (two) times daily with a meal., Disp: 60 tablet, Rfl: 0   metoprolol tartrate (LOPRESSOR) 50 MG tablet, Take 1 tablet (50 mg total) by mouth 2 (two) times daily., Disp: 180 tablet, Rfl: 0   Multiple Vitamins-Minerals (ADULT GUMMY PO),  Take 2 capsules by mouth daily., Disp: , Rfl:    olmesartan (BENICAR) 20 MG tablet, TAKE 1 TABLET BY MOUTH EVERY DAY IN THE EVENING, Disp: 30 tablet, Rfl: 6   rosuvastatin (CRESTOR) 20 MG tablet, TAKE 1 TABLET BY MOUTH EVERYDAY AT BEDTIME, Disp: 90 tablet, Rfl: 0   zinc gluconate 50 MG tablet, Take 50 mg by mouth daily., Disp: , Rfl:   Orders Placed This Encounter  Procedures   EKG 12-Lead   There are no Patient Instructions on file for this visit.   --Continue cardiac medications as reconciled in final medication list. --Return in about 6 months (around 05/08/2022) for Follow up, A. fib. Or sooner if needed. --Continue follow-up with your primary care physician regarding the management of your other chronic comorbid conditions.  Patient's questions and concerns were addressed to his satisfaction. He voices understanding of the instructions provided during this encounter.   This note was created using a voice recognition software as a result there may be grammatical errors inadvertently enclosed that do not reflect the nature of this encounter. Every attempt is made to correct such errors.  Rex Kras, [place], [facility]  Pager: [phone] Office: [phone]

## 2021-11-09 ENCOUNTER — Other Ambulatory Visit: Payer: Self-pay | Admitting: Neurology

## 2021-11-09 DIAGNOSIS — I480 Paroxysmal atrial fibrillation: Secondary | ICD-10-CM

## 2021-11-09 DIAGNOSIS — E119 Type 2 diabetes mellitus without complications: Secondary | ICD-10-CM

## 2021-11-09 DIAGNOSIS — E669 Obesity, unspecified: Secondary | ICD-10-CM

## 2021-11-09 DIAGNOSIS — G4719 Other hypersomnia: Secondary | ICD-10-CM

## 2021-11-15 ENCOUNTER — Ambulatory Visit (INDEPENDENT_AMBULATORY_CARE_PROVIDER_SITE_OTHER): Payer: No Typology Code available for payment source | Admitting: Neurology

## 2021-11-15 DIAGNOSIS — G4733 Obstructive sleep apnea (adult) (pediatric): Secondary | ICD-10-CM

## 2021-11-15 DIAGNOSIS — G4719 Other hypersomnia: Secondary | ICD-10-CM

## 2021-11-15 DIAGNOSIS — I480 Paroxysmal atrial fibrillation: Secondary | ICD-10-CM

## 2021-11-15 DIAGNOSIS — E669 Obesity, unspecified: Secondary | ICD-10-CM

## 2021-11-15 DIAGNOSIS — E119 Type 2 diabetes mellitus without complications: Secondary | ICD-10-CM

## 2021-11-17 NOTE — Progress Notes (Signed)
[facility]   HOME SLEEP TEST REPORT ( by Watch PAT)   STUDY DATE: loaded 11-17-2021  DOB: 1961/04/28     ORDERING CLINICIAN: Melvyn Novas, MD  REFERRING CLINICIAN:  Tessa Lerner, DO   CLINICAL INFORMATION/HISTORY: Pt was referred by cardiology 05-12-2021. His wife states that she has witnessed apnea events and that he snores.  States he sleeps about 6 hours but very restless. When he wakes up still feels tired.  New diagnosis of Afib after COVID 19 around Thanksgiving. Cardioversion was not needed. He has a  medical history of Anxiety, Atrial fibrillation (HCC), Diabetes mellitus without complication (HCC), History of COVID-19, and Hypertension.. CXR after his hospitalization from COVID pneumonia documented he continued to improve. His rest oxygen levels are 97%. He has some shortness of breath with climbing a hill. Has minor chest pain with deep breath. Overall, he is improving since I last saw him. He has no longer nocturnal cough , no phlegm production. He dreams vividly, naps often and naps last for 1-2 hours.    Epworth sleepiness score: 12/24.   BMI: 30.1 kg/m   Neck Circumference: 19"   FINDINGS:   Sleep Summary:   Total Recording Time (hours, min): Total recording time was 7 hours and 59 minutes with a total sleep time calculated at 7 hours 27 minutes.  REM sleep amounted to 21.2% of total sleep time.                             Respiratory Indices:   Calculated pAHI (per hour): Overall AHI was 16.8/h during REM sleep exacerbated to 27.9/h and in non-REM sleep 13.8/h.  Positional AHI in supine position was 26.9 and in nonsupine 11.3/h.  The lowest AHI was seen when sleeping on the right side with an AHI of 8.5/h.  Snoring was of average volume and accompanied about 21% of total sleep time.  The mean volume was 41 dB.                           Oxygen Saturation Statistics:   O2 Saturation Range (%): Varied between a nadir at 86% and a maximum of  99% with a mean oxygenation at 94%.                                     O2 Saturation (minutes) <89%:    0.2 minutes       Pulse Rate Statistics:      Pulse Range:   Ranged from 46 bpm to 84 bpm with a mean heart rate of 60 bpm.              IMPRESSION:  This HST confirms the presence of mild sleep apnea with a strong REM sleep exacerbation and equally strong dependence on supine sleep.  There was no significant hypoxia noted. This patient should avoid sleeping in supine position and preferably right sided sleep position it may help to use a tennis ball or the sleep balance device by [employer] to achieve this positional adjustment.   I would still recommend CPAP therapy.    RECOMMENDATION: An auto titration device with a setting from 5 through 15 cmH2O pressure to centimeter EPR heated humidification, mask of choice in conjunction with avoiding supine sleep position should help to reduce  the risk of recurrent atrial fibrillation and help with excessive daytime sleepiness.     INTERPRETING PHYSICIAN:   Melvyn Novas, MD   Medical Director of [facility].

## 2021-11-19 ENCOUNTER — Other Ambulatory Visit: Payer: Self-pay | Admitting: Cardiology

## 2021-11-19 DIAGNOSIS — E1159 Type 2 diabetes mellitus with other circulatory complications: Secondary | ICD-10-CM

## 2021-11-25 ENCOUNTER — Other Ambulatory Visit: Payer: Self-pay

## 2021-11-25 ENCOUNTER — Other Ambulatory Visit: Payer: Self-pay | Admitting: Cardiology

## 2021-11-25 ENCOUNTER — Telehealth: Payer: Self-pay | Admitting: Cardiology

## 2021-11-25 DIAGNOSIS — I480 Paroxysmal atrial fibrillation: Secondary | ICD-10-CM

## 2021-11-25 DIAGNOSIS — I10 Essential (primary) hypertension: Secondary | ICD-10-CM

## 2021-11-25 MED ORDER — OLMESARTAN MEDOXOMIL 20 MG PO TABS: ORAL_TABLET | ORAL | 6 refills | Status: DC

## 2021-11-25 NOTE — Telephone Encounter (Signed)
done

## 2021-11-25 NOTE — Telephone Encounter (Signed)
Pt wife req refill for   olmesartan (BENICAR) 20 MG tablet  [id number] - [place], [place] - [address]  [phone]

## 2021-11-26 ENCOUNTER — Telehealth: Payer: Self-pay | Admitting: Neurology

## 2021-11-26 NOTE — Telephone Encounter (Signed)
I will inform the MD that pt is calling asking for results. At this time they have not been read. Once the MD reads the study, I will reach out to the pt and review

## 2021-11-26 NOTE — Telephone Encounter (Signed)
Pt requesting a call back about his sleep study results.

## 2021-11-29 NOTE — Progress Notes (Signed)
   IMPRESSION:  This HST confirms the presence of mild sleep apnea with a strong REM sleep exacerbation and equally strong dependence on supine sleep.   There was no significant hypoxia noted. This patient should avoid sleeping in supine position and preferably right sided sleep position it may help to use a tennis ball or the sleep balance device by [employer] to achieve this positional adjustment.    I still recommend CPAP therapy.    RECOMMENDATION: An auto titration device with a setting from 5 through 15 cmH2O pressure to centimeter EPR heated humidification, mask of choice in conjunction with avoiding supine sleep position should help to reduce the risk of recurrent atrial fibrillation and help with excessive daytime sleepiness.

## 2021-11-29 NOTE — Addendum Note (Signed)
Addended by: Melvyn Novas on: 11/29/2021 06:32 PM   Modules accepted: Orders

## 2021-11-29 NOTE — Procedures (Signed)
[facility] [place] TEST REPORT ( by Watch PAT)   STUDY DATE: loaded 11-17-2021  DOB: 01/13/1961     ORDERING CLINICIAN: Larey Seat, MD  REFERRING CLINICIAN:  Rex Kras, DO   CLINICAL INFORMATION/HISTORY: Pt was referred by cardiology 05-12-2021. His wife states that she has witnessed apnea events and that he snores.  States he sleeps about 6 hours but very restless. When he wakes up still feels tired.  New diagnosis of Afib after COVID 19 around Thanksgiving. Cardioversion was not needed. He has a  medical history of Anxiety, Atrial fibrillation ([place]), Diabetes mellitus without complication ([place]), History of COVID-19, and Hypertension.. CXR after his hospitalization from [place] pneumonia documented he continued to improve. His rest oxygen levels are 97%. He has some shortness of breath with climbing a hill. Has minor chest pain with deep breath. Overall, he is improving since I last saw him. He has no longer nocturnal cough , no phlegm production. He dreams vividly, naps often and naps last for 1-2 hours.    Epworth sleepiness score: 12/24.   BMI: 30.1 kg/m   Neck Circumference: 19"   FINDINGS:   Sleep Summary:   Total Recording Time (hours, min): Total recording time was 7 hours and 59 minutes with a total sleep time calculated at 7 hours 27 minutes.  REM sleep amounted to 21.2% of total sleep time.                             Respiratory Indices:   Calculated pAHI (per hour): Overall AHI was 16.8/h during REM sleep exacerbated to 27.9/h and in non-REM sleep 13.8/h.  Positional AHI in supine position was 26.9 and in nonsupine 11.3/h.  The lowest AHI was seen when sleeping on the right side with an AHI of 8.5/h.  Snoring was of average volume and accompanied about 21% of total sleep time.  The mean volume was 41 dB.                           Oxygen Saturation Statistics:   O2 Saturation Range (%): Varied between a nadir at 86% and a maximum of 99% with a  mean oxygenation at 94%.                                     O2 Saturation (minutes) <89%:    0.2 minutes       Pulse Rate Statistics:      Pulse Range:   Ranged from 46 bpm to 84 bpm with a mean heart rate of 60 bpm.              IMPRESSION:  This HST confirms the presence of mild sleep apnea with a strong REM sleep exacerbation and equally strong dependence on supine sleep.   There was no significant hypoxia noted. This patient should avoid sleeping in supine position and preferably right sided sleep position it may help to use a tennis ball or the sleep balance device by [employer]ips to achieve this positional adjustment.    I still recommend CPAP therapy.    RECOMMENDATION: An auto titration device with a setting from 5 through 15 cmH2O pressure to centimeter EPR heated humidification, mask of choice in conjunction with avoiding supine sleep position should help to reduce the risk of recurrent atrial  fibrillation and help with excessive daytime sleepiness.     INTERPRETING PHYSICIAN:   Melvyn Novas, MD   Medical Director of [facility].

## 2021-11-30 ENCOUNTER — Telehealth: Payer: Self-pay | Admitting: Neurology

## 2021-11-30 ENCOUNTER — Other Ambulatory Visit: Payer: Self-pay

## 2021-11-30 DIAGNOSIS — E782 Mixed hyperlipidemia: Secondary | ICD-10-CM

## 2021-11-30 DIAGNOSIS — Z7901 Long term (current) use of anticoagulants: Secondary | ICD-10-CM

## 2021-11-30 DIAGNOSIS — E1159 Type 2 diabetes mellitus with other circulatory complications: Secondary | ICD-10-CM

## 2021-11-30 NOTE — Telephone Encounter (Signed)
-----   Message from Melvyn Novas, MD sent at 11/29/2021  6:32 PM EST -----   IMPRESSION:  This HST confirms the presence of mild sleep apnea with a strong REM sleep exacerbation and equally strong dependence on supine sleep.   There was no significant hypoxia noted. This patient should avoid sleeping in supine position and preferably right sided sleep position it may help to use a tennis ball or the sleep balance device by [employer] to achieve this positional adjustment.    I still recommend CPAP therapy.    RECOMMENDATION: An auto titration device with a setting from 5 through 15 cmH2O pressure to centimeter EPR heated humidification, mask of choice in conjunction with avoiding supine sleep position should help to reduce the risk of recurrent atrial fibrillation and help with excessive daytime sleepiness.

## 2021-11-30 NOTE — Telephone Encounter (Signed)
I called pt. I advised pt that Dr. Vickey Huger reviewed their sleep study results and found that pt has sleep apnea. Dr. Vickey Huger recommends that pt starts auto CPAP. I reviewed PAP compliance expectations with the pt. Pt is agreeable to starting a CPAP. I advised pt that an order will be sent to a [facility] and [facility] will call the pt within about one week after they file with the pt's insurance. [facility] will show the pt how to use the machine, fit for masks, and troubleshoot the CPAP if needed. A follow up appt will need to be made for insurance purposes with Dr. Vickey Huger or NP within 31-90 days from the date he starts the machine. Pt would like to call back to schedule this. A letter with all of this information in it will be mailed to the pt as a reminder.  Pt verbalized understanding of results. Pt had no questions at this time but was encouraged to call back if questions arise. I have sent the order to [facility] and have received confirmation that they have received the order.

## 2021-11-30 NOTE — Telephone Encounter (Signed)
Results were in EPIC !

## 2022-01-21 ENCOUNTER — Other Ambulatory Visit: Payer: Self-pay

## 2022-01-21 ENCOUNTER — Encounter: Payer: Self-pay | Admitting: Cardiology

## 2022-01-21 ENCOUNTER — Ambulatory Visit: Payer: BC Managed Care – PPO | Admitting: Cardiology

## 2022-01-21 VITALS — BP 120/77 | HR 58 | Temp 97.5°F | Resp 16 | Ht 78.0 in | Wt 262.0 lb

## 2022-01-21 DIAGNOSIS — Z7901 Long term (current) use of anticoagulants: Secondary | ICD-10-CM

## 2022-01-21 DIAGNOSIS — I480 Paroxysmal atrial fibrillation: Secondary | ICD-10-CM

## 2022-01-21 DIAGNOSIS — I10 Essential (primary) hypertension: Secondary | ICD-10-CM

## 2022-01-21 DIAGNOSIS — I2089 Other forms of angina pectoris: Secondary | ICD-10-CM

## 2022-01-21 DIAGNOSIS — E782 Mixed hyperlipidemia: Secondary | ICD-10-CM

## 2022-01-21 DIAGNOSIS — I208 Other forms of angina pectoris: Secondary | ICD-10-CM

## 2022-01-21 DIAGNOSIS — E1159 Type 2 diabetes mellitus with other circulatory complications: Secondary | ICD-10-CM

## 2022-01-21 DIAGNOSIS — I209 Angina pectoris, unspecified: Secondary | ICD-10-CM

## 2022-01-21 MED ORDER — ASPIRIN EC 81 MG PO TBEC: 81.0000 mg | DELAYED_RELEASE_TABLET | Freq: Every day | ORAL | 0 refills | Status: DC

## 2022-01-21 MED ORDER — NITROGLYCERIN 0.4 MG SL SUBL: 0.4000 mg | SUBLINGUAL_TABLET | SUBLINGUAL | 0 refills | Status: AC | PRN

## 2022-01-21 NOTE — Progress Notes (Signed)
Date:  01/21/2022   ID:  Alejandro Bowen, DOB 02-15-1961, MRN [id number]  PCP:  Christain Sacramento, MD  Cardiologist: Rex Kras, DO, [facility] (established care 12/24/2020)  Date: 01/21/22 Last Office Visit: 11/08/2021  Chief Complaint  Patient presents with   Chest Pain   Follow-up    HPI  Alejandro Bowen is a 61 y.o. male who presents to the office with a chief complaint of "chest pain evaluation." Patient's past medical history and cardiovascular risk factors include: Hypertension, non-insulin-dependent diabetes mellitus type 2, hyperlipidemia anxiety, obesity due to excess calories, recent COVID-19 infection, acute hypoxic respiratory failure due to COVID-19 pneumonia, paroxysmal atrial fibrillation.  He is referred to the office at the request of Christain Sacramento, MD for evaluation of management of atrial fibrillation.  In November 2021 he was diagnosed with COVID-19 pneumonia and experienced hypoxemia and COVID-pneumonia requiring hospitalization.  During that hospitalization he was diagnosed with new onset of atrial fibrillation with rapid ventricular rate he was started on medical therapy and scheduled for direct-current cardioversion.  However, on the day of the cardioversion he was in sinus rhythm and the procedure was aborted.  Since then he has been working on improving his modifiable cardiovascular risk factors.  His blood pressure is better controlled, he is also been evaluated for sleep apnea and currently not using CPAP.  He presents today for evaluation of chest pain.  Patient states that starting December 2022 he has started exercising on a treadmill.  He tries to walk about 2 miles every other day under 45 minutes. Within 5 minutes into exercising he experiences substernal discomfort.  He has been attributing the discomfort/tightness to improper posture.  But the pain usually resolves after resting 15 minutes.  However, due to ongoing discomfort he presents today for  evaluation.  Patient denies any near-syncope or syncopal event, orthopnea, paroxysmal nocturnal dyspnea or lower extremity swelling.  FUNCTIONAL STATUS: No structured exercise program or daily routine. But is active with his job duties.   ALLERGIES: Allergies  Allergen Reactions   Penicillins Hives   Alprazolam Other (See Comments)    depression    MEDICATION LIST PRIOR TO VISIT: Current Meds  Medication Sig   apixaban (ELIQUIS) 5 MG TABS tablet Take 1 tablet (5 mg total) by mouth 2 (two) times daily.   Ascorbic Acid (VITAMIN C) 1000 MG tablet Take 1,000 mg by mouth daily.   aspirin EC 81 MG tablet Take 1 tablet (81 mg total) by mouth daily. Swallow whole.   blood glucose meter kit and supplies Dispense based on patient and insurance preference. Use up to four times daily as directed. (FOR ICD-10 E10.9, E11.9).   cholecalciferol (VITAMIN D3) 25 MCG (1000 UNIT) tablet Take 2,000 Units by mouth daily.   citalopram (CELEXA) 20 MG tablet Take 20 mg by mouth daily.   glucose blood (FREESTYLE TEST STRIPS) test strip Use as instructed   Insulin Pen Needle 32G X 4 MM MISC 1 each by Does not apply route as needed.   Lancets (FREESTYLE) lancets Use as instructed   Melatonin 10 MG CAPS Take 10 mg by mouth at bedtime as needed (sleep).   metFORMIN (GLUCOPHAGE) 500 MG tablet Take 1 tablet (500 mg total) by mouth 2 (two) times daily with a meal.   metoprolol tartrate (LOPRESSOR) 50 MG tablet TAKE 1 TABLET BY MOUTH TWICE A DAY   Multiple Vitamins-Minerals (ADULT GUMMY PO) Take 2 capsules by mouth daily.   nitroGLYCERIN (NITROSTAT) 0.4 MG SL tablet Place  1 tablet (0.4 mg total) under the tongue every 5 (five) minutes as needed for chest pain. If you require more than two tablets five minutes apart go to the nearest ER via EMS.   olmesartan (BENICAR) 20 MG tablet TAKE 1 TABLET BY MOUTH EVERY DAY IN THE EVENING   olmesartan (BENICAR) 20 MG tablet TAKE 1 TABLET BY MOUTH EVERY DAY IN THE EVENING    rosuvastatin (CRESTOR) 20 MG tablet TAKE 1 TABLET BY MOUTH EVERYDAY AT BEDTIME   zinc gluconate 50 MG tablet Take 50 mg by mouth daily.     PAST MEDICAL HISTORY: Past Medical History:  Diagnosis Date   Anxiety    Atrial fibrillation ([place])    Diabetes mellitus without complication ([place])    History of COVID-19    Hypertension     PAST SURGICAL HISTORY: History reviewed. No pertinent surgical history.  FAMILY HISTORY: The patient family history includes Dementia in his mother; Heart disease in his father; Hypertension in his brother.  SOCIAL HISTORY:  The patient  reports that he has never smoked. He has never used smokeless tobacco. He reports current alcohol use. He reports that he does not use drugs.  REVIEW OF SYSTEMS: Review of Systems  Constitutional: Negative for chills and fever.  HENT:  Negative for hoarse voice and nosebleeds.   Eyes:  Negative for discharge, double vision and pain.  Cardiovascular:  Positive for chest pain. Negative for claudication, dyspnea on exertion, leg swelling, near-syncope, orthopnea, palpitations, paroxysmal nocturnal dyspnea and syncope.  Respiratory:  Positive for snoring. Negative for hemoptysis and shortness of breath.   Musculoskeletal:  Negative for muscle cramps and myalgias.  Gastrointestinal:  Negative for abdominal pain, constipation, diarrhea, hematemesis, hematochezia, melena, nausea and vomiting.  Neurological:  Negative for dizziness and light-headedness.   PHYSICAL EXAM: Vitals with BMI 01/21/2022 11/08/2021 05/12/2021  Height 6' 6"  6' 6"  6' 6"   Weight 262 lbs 264 lbs 260 lbs  BMI 30.28 03.47 42.59  Systolic 563 875 643  Diastolic 77 74 76  Pulse 58 54 52    CONSTITUTIONAL: Well-developed and well-nourished. No acute distress.  SKIN: Skin is warm and dry. No rash noted. No cyanosis. No pallor. No jaundice HEAD: Normocephalic and atraumatic.  EYES: No scleral icterus MOUTH/THROAT: Moist oral membranes.  NECK: Unable to  evaluate JVP due to a short neck with adipose tissue. No thyromegaly noted. No carotid bruits  LYMPHATIC: No visible cervical adenopathy.  CHEST Normal respiratory effort. No intercostal retractions  LUNGS: Clear to auscultation bilaterally.  No stridor. No wheezes. No rales.  CARDIOVASCULAR: Regular, positive S1-S2, no murmurs rubs or gallops appreciated ABDOMINAL: Obese, soft, nontender, nondistended, positive bowel sounds in all 4 quadrants, no apparent ascites.  EXTREMITIES: No peripheral edema  HEMATOLOGIC: No significant bruising NEUROLOGIC: Oriented to person, place, and time. Nonfocal. Normal muscle tone.  PSYCHIATRIC: Normal mood and affect. Normal behavior. Cooperative  CARDIAC DATABASE: EKG: 01/21/2022: Sinus bradycardia, 57 bpm, normal axis, without underlying ischemia or injury pattern.   Echocardiogram: 12/01/2020: LVEF 60-65%, no regional wall motion abnormalities, no significant valvular abnormality.  Stress Testing: Exercise Sestamibi stress test 04/05/2021: Exercise nuclear stress test was performed using Bruce protocol. Patient reached 7 METS, and 87% of age predicted maximum heart rate. Exercise capacity was low. No chest pain reported. Heart rate and hemodynamic response were normal. Stress EKG revealed no ischemic changes. Normal stress myocardial perfusion. Stress LVEF 52%. Low risk study.  Heart Catheterization: None  Coronary calcium score 06/04/2021: 1. Coronary calcium score  of 0. 2. Resolution of acute COVID-19 pneumonia related infiltrates with prominent residual bandlike areas of peripheral scarring in both lungs. There may be some mild associated bronchiectasis due to traction in the lower lobes bilaterally. No overt fibrosis is identified. Correlation suggested with any chronic respiratory symptoms following prior COVID pneumonia.  LABORATORY DATA: CBC Latest Ref Rng & Units 12/24/2020 12/09/2020 12/08/2020  WBC 3.4 - 10.8 x10E3/uL 5.5 20.7(H) 16.3(H)   Hemoglobin 13.0 - 17.7 g/dL 13.9 16.2 15.8  Hematocrit 37.5 - 51.0 % 41.1 48.0 46.7  Platelets 150 - 450 x10E3/uL 221 243 263    CMP Latest Ref Rng & Units 04/02/2021 12/24/2020 12/09/2020  Glucose 65 - 99 mg/dL 126(H) 167(H) 76  BUN 6 - 24 mg/dL 16 17 41(H)  Creatinine 0.76 - 1.27 mg/dL 0.93 0.88 1.22  Sodium 134 - 144 mmol/L 141 142 135  Potassium 3.5 - 5.2 mmol/L 4.7 4.4 4.5  Chloride 96 - 106 mmol/L 103 105 101  CO2 20 - 29 mmol/L 22 21 26   Calcium 8.7 - 10.2 mg/dL 9.9 9.7 9.5  Total Protein 6.0 - 8.5 g/dL 6.9 6.4 6.2(L)  Total Bilirubin 0.0 - 1.2 mg/dL 0.3 0.3 1.0  Alkaline Phos 44 - 121 IU/L 95 87 67  AST 0 - 40 IU/L 50(H) 55(H) 44(H)  ALT 0 - 44 IU/L 76(H) 101(H) 102(H)   Lipid Panel     Component Value Date/Time   CHOL 110 06/02/2021 1308   TRIG 113 06/02/2021 1308   HDL 46 06/02/2021 1308   LDLCALC 43 06/02/2021 1308   LDLDIRECT 41 06/02/2021 1309   LABVLDL 21 06/02/2021 1308    No components found for: NTPROBNP No results for input(s): PROBNP in the last 8760 hours. No results for input(s): TSH in the last 8760 hours.   BMP Recent Labs    04/02/21 1238  NA 141  K 4.7  CL 103  CO2 22  GLUCOSE 126*  BUN 16  CREATININE 0.93  CALCIUM 9.9    HEMOGLOBIN A1C Lab Results  Component Value Date   HGBA1C 8.3 (H) 11/30/2020   MPG 191.51 11/30/2020   Lipid profile: Collected: 08/11/2020 Total cholesterol 189, triglycerides 142, HDL 45, LDL 113, non-HDL 144.  IMPRESSION:    ICD-10-CM   1. Angina pectoris (HCC)  I20.9 EKG 12-Lead    aspirin EC 81 MG tablet    nitroGLYCERIN (NITROSTAT) 0.4 MG SL tablet    Basic metabolic panel    2. Type 2 diabetes mellitus with other circulatory complication, without long-term current use of insulin (HCC)  E11.59     3. Paroxysmal atrial fibrillation (HCC)  I48.0     4. Long term (current) use of anticoagulants  Z79.01     5. Benign hypertension  I10     6. Mixed hyperlipidemia  E78.2     7. Other forms of  angina pectoris ([place])  I20.8 CT CORONARY MORPH W/CTA COR W/SCORE W/CA W/CM &/OR WO/CM    Basic metabolic panel       RECOMMENDATIONS: Evelyn Moch is a 61 y.o. male whose past medical history and cardiac risk factors include: Hypertension, anxiety, obesity due to excess calories, recent COVID-19 infection, acute hypoxic respiratory failure due to COVID-19 pneumonia, NIDDM, paroxysmal atrial fibrillation.  Angina pectoris: Patient's symptoms are very suggestive of cardiac etiology. Pretest probability of CAD intermediate/high Recommended left heart catheterization with possible intervention.  However, after discussing the risks, benefits, alternatives he would like to proceed with coronary CTA. Patient's heart rate  is already well controlled and he is on metoprolol. Sublingual nitroglycerin tablets provided medication profile discussed. Check BMP -he has had recent blood work at [facility] primary care office. EKG shows sinus bradycardia without underlying injury pattern or active ischemia. Currently chest pain-free. Educated on seeking medical attention sooner by going to the closest ER via EMS if the symptoms increase in intensity, frequency, duration, or has typical chest pain as discussed in the office.  Patient verbalized understanding.  Atrial fibrillation, paroxysmal: Rate control: Metoprolol. Rhythm control: N/A. Thromboembolic prophylaxis: Eliquis CHA2DS2-VASc SCORE is 2 (HTN, DM) which correlates to 2.9% risk of stroke per year.  Long-term oral anticoagulation: Indication: Paroxysmal atrial fibrillation. He does not endorse evidence of bleeding. Reemphasize the risks, benefits, and alternatives to oral anticoagulation.  Benign essential hypertension: Office blood pressures are within acceptable range. Blood pressure log reviewed.  Morning blood pressure usually elevated compared to evening. Patient is also noted to have mild sleep apnea, currently not on CPAP.  I have asked  him to follow-up with his sleep provider to discuss this further. Reemphasized importance of low-salt diet Reemphasized increasing physical activity with a goal of 30 minutes a day 5 days a week.  Non-insulin-dependent diabetes mellitus type 2: Currently managed by primary care provider. Started on statin therapy given his estimated 10-year risk of ASCVD and underlying diabetes.  Patient is most recent LDL levels reviewed and currently at goal. Patient does not endorse any evidence of myalgias. Continue for now.  Obesity, due to excess calories: Body mass index is 30.28 kg/m.  I reviewed with the patient the importance of diet, regular physical activity/exercise, weight loss.   Patient is educated on increasing physical activity gradually as tolerated.  With the goal of moderate intensity exercise for 30 minutes a day 5 days a week.   FINAL MEDICATION LIST END OF ENCOUNTER: Meds ordered this encounter  Medications   aspirin EC 81 MG tablet    Sig: Take 1 tablet (81 mg total) by mouth daily. Swallow whole.    Dispense:  30 tablet    Refill:  0   nitroGLYCERIN (NITROSTAT) 0.4 MG SL tablet    Sig: Place 1 tablet (0.4 mg total) under the tongue every 5 (five) minutes as needed for chest pain. If you require more than two tablets five minutes apart go to the nearest ER via EMS.    Dispense:  30 tablet    Refill:  0     There are no discontinued medications.    Current Outpatient Medications:    apixaban (ELIQUIS) 5 MG TABS tablet, Take 1 tablet (5 mg total) by mouth 2 (two) times daily., Disp: 60 tablet, Rfl: 0   Ascorbic Acid (VITAMIN C) 1000 MG tablet, Take 1,000 mg by mouth daily., Disp: , Rfl:    aspirin EC 81 MG tablet, Take 1 tablet (81 mg total) by mouth daily. Swallow whole., Disp: 30 tablet, Rfl: 0   blood glucose meter kit and supplies, Dispense based on patient and insurance preference. Use up to four times daily as directed. (FOR ICD-10 E10.9, E11.9)., Disp: 1 each, Rfl:  0   cholecalciferol (VITAMIN D3) 25 MCG (1000 UNIT) tablet, Take 2,000 Units by mouth daily., Disp: , Rfl:    citalopram (CELEXA) 20 MG tablet, Take 20 mg by mouth daily., Disp: , Rfl:    glucose blood (FREESTYLE TEST STRIPS) test strip, Use as instructed, Disp: 100 each, Rfl: 0   Insulin Pen Needle 32G X 4 MM MISC, 1  each by Does not apply route as needed., Disp: 200 each, Rfl: 0   Lancets (FREESTYLE) lancets, Use as instructed, Disp: 100 each, Rfl: 0   Melatonin 10 MG CAPS, Take 10 mg by mouth at bedtime as needed (sleep)., Disp: , Rfl:    metFORMIN (GLUCOPHAGE) 500 MG tablet, Take 1 tablet (500 mg total) by mouth 2 (two) times daily with a meal., Disp: 60 tablet, Rfl: 0   metoprolol tartrate (LOPRESSOR) 50 MG tablet, TAKE 1 TABLET BY MOUTH TWICE A DAY, Disp: 180 tablet, Rfl: 0   Multiple Vitamins-Minerals (ADULT GUMMY PO), Take 2 capsules by mouth daily., Disp: , Rfl:    nitroGLYCERIN (NITROSTAT) 0.4 MG SL tablet, Place 1 tablet (0.4 mg total) under the tongue every 5 (five) minutes as needed for chest pain. If you require more than two tablets five minutes apart go to the nearest ER via EMS., Disp: 30 tablet, Rfl: 0   olmesartan (BENICAR) 20 MG tablet, TAKE 1 TABLET BY MOUTH EVERY DAY IN THE EVENING, Disp: 90 tablet, Rfl: 2   olmesartan (BENICAR) 20 MG tablet, TAKE 1 TABLET BY MOUTH EVERY DAY IN THE EVENING, Disp: 30 tablet, Rfl: 6   rosuvastatin (CRESTOR) 20 MG tablet, TAKE 1 TABLET BY MOUTH EVERYDAY AT BEDTIME, Disp: 90 tablet, Rfl: 0   zinc gluconate 50 MG tablet, Take 50 mg by mouth daily., Disp: , Rfl:   Orders Placed This Encounter  Procedures   CT CORONARY MORPH W/CTA COR W/SCORE W/CA W/CM &/OR WO/CM   Basic metabolic panel   EKG [id number]   There are no Patient Instructions on file for this visit.   --Continue cardiac medications as reconciled in final medication list. --Return in about 2 weeks (around 02/04/2022) for Follow up, Chest pain. Or sooner if needed. --Continue follow-up  with your primary care physician regarding the management of your other chronic comorbid conditions.  Patient's questions and concerns were addressed to his satisfaction. He voices understanding of the instructions provided during this encounter.   This note was created using a voice recognition software as a result there may be grammatical errors inadvertently enclosed that do not reflect the nature of this encounter. Every attempt is made to correct such errors.  Rex Kras, [place], [facility]  Pager: [phone] Office: [phone]

## 2022-01-24 ENCOUNTER — Ambulatory Visit: Payer: BC Managed Care – PPO | Admitting: Cardiology

## 2022-01-31 ENCOUNTER — Telehealth (HOSPITAL_COMMUNITY): Payer: Self-pay | Admitting: *Deleted

## 2022-01-31 NOTE — Telephone Encounter (Signed)
Reaching out to patient to offer assistance regarding upcoming cardiac imaging study; pt verbalizes understanding of appt date/time, parking situation and where to check in, pre-test NPO status; name and call back number provided for further questions should they arise  Larey Brick RN Navigator Cardiac Imaging [facility] [phone] office [phone] cell  Patient aware to arrive at 1:30pm for his 2pm scan.

## 2022-02-02 ENCOUNTER — Ambulatory Visit (HOSPITAL_COMMUNITY)
Admission: RE | Admit: 2022-02-02 | Discharge: 2022-02-02 | Disposition: A | Discharge status: Home / Self Care | Payer: No Typology Code available for payment source | Source: Ambulatory Visit | Attending: Family Medicine | Admitting: Family Medicine

## 2022-02-02 ENCOUNTER — Other Ambulatory Visit: Payer: Self-pay

## 2022-02-02 DIAGNOSIS — I208 Other forms of angina pectoris: Secondary | ICD-10-CM | POA: Diagnosis present

## 2022-02-02 MED ORDER — NITROGLYCERIN 0.4 MG SL SUBL
SUBLINGUAL_TABLET | SUBLINGUAL | Status: AC
  Filled 2022-02-02: qty 2

## 2022-02-02 MED ORDER — IOHEXOL 350 MG/ML SOLN
95.0000 mL | Freq: Once | INTRAVENOUS | Status: AC | PRN
  Administered 2022-02-02: 95 mL via INTRAVENOUS

## 2022-02-02 MED ORDER — NITROGLYCERIN 0.4 MG SL SUBL
0.8000 mg | SUBLINGUAL_TABLET | Freq: Once | SUBLINGUAL | Status: AC
  Administered 2022-02-02: 0.8 mg via SUBLINGUAL

## 2022-02-07 DIAGNOSIS — I208 Other forms of angina pectoris: Secondary | ICD-10-CM | POA: Insufficient documentation

## 2022-02-11 ENCOUNTER — Encounter: Payer: Self-pay | Admitting: Cardiology

## 2022-02-11 ENCOUNTER — Other Ambulatory Visit: Payer: Self-pay

## 2022-02-11 ENCOUNTER — Ambulatory Visit: Payer: No Typology Code available for payment source | Admitting: Cardiology

## 2022-02-11 VITALS — BP 143/82 | HR 59 | Temp 98.2°F | Resp 16 | Ht 78.0 in | Wt 266.0 lb

## 2022-02-11 DIAGNOSIS — I480 Paroxysmal atrial fibrillation: Secondary | ICD-10-CM

## 2022-02-11 DIAGNOSIS — E1159 Type 2 diabetes mellitus with other circulatory complications: Secondary | ICD-10-CM

## 2022-02-11 DIAGNOSIS — Z7901 Long term (current) use of anticoagulants: Secondary | ICD-10-CM

## 2022-02-11 DIAGNOSIS — I10 Essential (primary) hypertension: Secondary | ICD-10-CM

## 2022-02-11 DIAGNOSIS — E782 Mixed hyperlipidemia: Secondary | ICD-10-CM

## 2022-02-11 NOTE — Progress Notes (Signed)
ID:  Alejandro Bowen, DOB 1961/07/05, MRN [id number]  PCP:  [facility]  Cardiologist: Tessa Lerner, DO, [facility] (established care 12/24/2020)  Date: 02/11/22 Last Office Visit: 01/21/2022  Chief Complaint  Patient presents with   Follow-up    Reevaluation of chest pain and discuss results    HPI  Alejandro Bowen is a 61 y.o. male who presents to the office with a chief complaint of " reevaluation of chest pain and discuss test results." Patient's past medical history and cardiovascular risk factors include: Hypertension, non-insulin-dependent diabetes mellitus type 2, hyperlipidemia anxiety, obesity due to excess calories, recent COVID-19 infection, acute hypoxic respiratory failure due to COVID-19 pneumonia, paroxysmal atrial fibrillation.  He is referred to the office at the request of [name], MD for evaluation of management of atrial fibrillation.  In November 2020 when he was diagnosed with COVID-19 pneumonia and hospitalized for hypoxemia/COVID-pneumonia.  During that hospitalization he was found to be in new onset of atrial fibrillation and was started on medical therapy and has done well.  At the last office visit patient has concerns with regards to chest pain which are suggestive of possible cardiac etiology and given his other cardiovascular risk factors including diabetes that shared decision was to proceed with coronary CTA for further evaluation and management.  Results of the coronary CTA reviewed with the patient and his wife at today's visit and noted below for further reference.  Patient's total coronary calcium score is 0 and no epicardial coronary artery disease.  Noncardiac findings were also discussed with the patient and his wife at today's office visit.  Since last office visit patient has not had any reoccurrence of chest pain and denies heart failure symptoms.  FUNCTIONAL STATUS: No structured exercise program or daily routine. But  is active with his job duties.   ALLERGIES: Allergies  Allergen Reactions   Penicillins Hives   Alprazolam Other (See Comments)    depression    MEDICATION LIST PRIOR TO VISIT: Current Meds  Medication Sig   apixaban (ELIQUIS) 5 MG TABS tablet Take 1 tablet (5 mg total) by mouth 2 (two) times daily.   Ascorbic Acid (VITAMIN C) 1000 MG tablet Take 1,000 mg by mouth daily.   blood glucose meter kit and supplies Dispense based on patient and insurance preference. Use up to four times daily as directed. (FOR ICD-10 E10.9, E11.9).   cholecalciferol (VITAMIN D3) 25 MCG (1000 UNIT) tablet Take 2,000 Units by mouth daily.   glucose blood (FREESTYLE TEST STRIPS) test strip Use as instructed   Insulin Pen Needle 32G X 4 MM MISC 1 each by Does not apply route as needed.   Lancets (FREESTYLE) lancets Use as instructed   Melatonin 10 MG CAPS Take 10 mg by mouth at bedtime as needed (sleep).   metFORMIN (GLUCOPHAGE) 500 MG tablet Take 1 tablet (500 mg total) by mouth 2 (two) times daily with a meal.   metoprolol tartrate (LOPRESSOR) 50 MG tablet TAKE 1 TABLET BY MOUTH TWICE A DAY   Multiple Vitamins-Minerals (ADULT GUMMY PO) Take 2 capsules by mouth daily.   nitroGLYCERIN (NITROSTAT) 0.4 MG SL tablet Place 1 tablet (0.4 mg total) under the tongue every 5 (five) minutes as needed for chest pain. If you require more than two tablets five minutes apart go to the nearest ER via EMS.   rosuvastatin (CRESTOR) 20 MG tablet TAKE 1 TABLET BY MOUTH EVERYDAY AT BEDTIME   [DISCONTINUED] aspirin EC 81 MG tablet Take  1 tablet (81 mg total) by mouth daily. Swallow whole.     PAST MEDICAL HISTORY: Past Medical History:  Diagnosis Date   Anxiety    Atrial fibrillation ([place])    Diabetes mellitus without complication ([place])    History of COVID-19    Hypertension     PAST SURGICAL HISTORY: History reviewed. No pertinent surgical history.  FAMILY HISTORY: The patient family history includes Dementia in his  mother; Heart disease in his father; Hypertension in his brother.  SOCIAL HISTORY:  The patient  reports that he has never smoked. He has never used smokeless tobacco. He reports current alcohol use. He reports that he does not use drugs.  REVIEW OF SYSTEMS: Review of Systems  Constitutional: Negative for chills and fever.  HENT:  Negative for hoarse voice and nosebleeds.   Eyes:  Negative for discharge, double vision and pain.  Cardiovascular:  Negative for chest pain, claudication, dyspnea on exertion, leg swelling, near-syncope, orthopnea, palpitations, paroxysmal nocturnal dyspnea and syncope.  Respiratory:  Positive for snoring. Negative for hemoptysis and shortness of breath.   Musculoskeletal:  Negative for muscle cramps and myalgias.  Gastrointestinal:  Negative for abdominal pain, constipation, diarrhea, hematemesis, hematochezia, melena, nausea and vomiting.  Neurological:  Negative for dizziness and light-headedness.   PHYSICAL EXAM: Vitals with BMI 02/11/2022 02/02/2022 02/02/2022  Height $Remov'6\' 6"'muOxbR$  - -  Weight 266 lbs - -  BMI 57.26 - -  Systolic 203 559 741  Diastolic 82 70 91  Pulse 59 57 57    CONSTITUTIONAL: Well-developed and well-nourished. No acute distress.  SKIN: Skin is warm and dry. No rash noted. No cyanosis. No pallor. No jaundice HEAD: Normocephalic and atraumatic.  EYES: No scleral icterus MOUTH/THROAT: Moist oral membranes.  NECK: Unable to evaluate JVP due to a short neck with adipose tissue. No thyromegaly noted. No carotid bruits  LYMPHATIC: No visible cervical adenopathy.  CHEST Normal respiratory effort. No intercostal retractions  LUNGS: Clear to auscultation bilaterally.  No stridor. No wheezes. No rales.  CARDIOVASCULAR: Regular, positive S1-S2, no murmurs rubs or gallops appreciated ABDOMINAL: Obese, soft, nontender, nondistended, positive bowel sounds in all 4 quadrants, no apparent ascites.  EXTREMITIES: No peripheral edema  HEMATOLOGIC: No  significant bruising NEUROLOGIC: Oriented to person, place, and time. Nonfocal. Normal muscle tone.  PSYCHIATRIC: Normal mood and affect. Normal behavior. Cooperative  CARDIAC DATABASE: EKG: 01/21/2022: Sinus bradycardia, 57 bpm, normal axis, without underlying ischemia or injury pattern.   Echocardiogram: 12/01/2020: LVEF 60-65%, no regional wall motion abnormalities, no significant valvular abnormality.  Stress Testing: Exercise Sestamibi stress test 04/05/2021: Exercise nuclear stress test was performed using Bruce protocol. Patient reached 7 METS, and 87% of age predicted maximum heart rate. Exercise capacity was low. No chest pain reported. Heart rate and hemodynamic response were normal. Stress EKG revealed no ischemic changes. Normal stress myocardial perfusion. Stress LVEF 52%. Low risk study.  Heart Catheterization: None  Coronary calcium score 06/04/2021: 1. Coronary calcium score of 0. 2. Resolution of acute COVID-19 pneumonia related infiltrates with prominent residual bandlike areas of peripheral scarring in both lungs. There may be some mild associated bronchiectasis due to traction in the lower lobes bilaterally. No overt fibrosis is identified. Correlation suggested with any chronic respiratory symptoms following prior COVID pneumonia.  CCTA:  02/02/2022 1. Total coronary calcium score of 0.   2. Normal coronary origin with left dominance.   3. CAD-RADS = 0. No significant epicardial coronary artery disease. Due to artifact the accuracy of the proximal  non-dominant RCA, distal and apical LAD is reduced.  Noncardiac findings:  1.  No acute findings in the imaged extracardiac chest. 2. 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 3.  Aortic Atherosclerosis (ICD10-I70.0). 4. Mild hepatic steatosis.  LABORATORY DATA: CBC Latest Ref Rng & Units  12/24/2020 12/09/2020 12/08/2020  WBC 3.4 - 10.8 x10E3/uL 5.5 20.7(H) 16.3(H)  Hemoglobin 13.0 - 17.7 g/dL 13.9 16.2 15.8  Hematocrit 37.5 - 51.0 % 41.1 48.0 46.7  Platelets 150 - 450 x10E3/uL 221 243 263    CMP Latest Ref Rng & Units 04/02/2021 12/24/2020 12/09/2020  Glucose 65 - 99 mg/dL 126(H) 167(H) 76  BUN 6 - 24 mg/dL 16 17 41(H)  Creatinine 0.76 - 1.27 mg/dL 0.93 0.88 1.22  Sodium 134 - 144 mmol/L 141 142 135  Potassium 3.5 - 5.2 mmol/L 4.7 4.4 4.5  Chloride 96 - 106 mmol/L 103 105 101  CO2 20 - 29 mmol/L $RemoveB'22 21 26  'thiLBEhk$ Calcium 8.7 - 10.2 mg/dL 9.9 9.7 9.5  Total Protein 6.0 - 8.5 g/dL 6.9 6.4 6.2(L)  Total Bilirubin 0.0 - 1.2 mg/dL 0.3 0.3 1.0  Alkaline Phos 44 - 121 IU/L 95 87 67  AST 0 - 40 IU/L 50(H) 55(H) 44(H)  ALT 0 - 44 IU/L 76(H) 101(H) 102(H)   Lipid Panel     Component Value Date/Time   CHOL 110 06/02/2021 1308   TRIG 113 06/02/2021 1308   HDL 46 06/02/2021 1308   LDLCALC 43 06/02/2021 1308   LDLDIRECT 41 06/02/2021 1309   LABVLDL 21 06/02/2021 1308    No components found for: NTPROBNP No results for input(s): PROBNP in the last 8760 hours. No results for input(s): TSH in the last 8760 hours.   BMP Recent Labs    04/02/21 1238  NA 141  K 4.7  CL 103  CO2 22  GLUCOSE 126*  BUN 16  CREATININE 0.93  CALCIUM 9.9    HEMOGLOBIN A1C Lab Results  Component Value Date   HGBA1C 8.3 (H) 11/30/2020   MPG 191.51 11/30/2020   Lipid profile: Collected: 08/11/2020 Total cholesterol 189, triglycerides 142, HDL 45, LDL 113, non-HDL 144.  IMPRESSION:    ICD-10-CM   1. Paroxysmal atrial fibrillation (HCC)  I48.0     2. Long term (current) use of anticoagulants  Z79.01     3. Benign hypertension  I10     4. Mixed hyperlipidemia  E78.2     5. Type 2 diabetes mellitus with other circulatory complication, without long-term current use of insulin (HCC)  E11.59         RECOMMENDATIONS: Alejandro Bowen is a 61 y.o. male whose past medical history and cardiac  risk factors include: Hypertension, anxiety, obesity due to excess calories, recent COVID-19 infection, acute hypoxic respiratory failure due to COVID-19 pneumonia, NIDDM, paroxysmal atrial fibrillation.  Paroxysmal atrial fibrillation (HCC) Rate control: Metoprolol. Rhythm control: N/A. Thromboembolic prophylaxis Eliquis. CHA2DS2-VASc SCORE is 2 which correlates to 2.2% risk of stroke per year (hypertension, DM). No prior history of cardioversion or atrial fibrillation ablation.  Long term (current) use of anticoagulants Indication: Paroxysmal atrial fibrillation. Does not endorse evidence of bleeding. Reemphasized the risks, benefits, and alternatives to oral anticoagulation. The patient is considering left atrial appendage occlusion device, i.e. watchman as an alternative to oral anticoagulation.  Benign hypertension Office blood pressures are within acceptable range but currently not at goal. Reemphasized the importance of considering CPAP  given his underlying sleep apnea.  I have asked him to discuss it further with his sleep provider. 3 emphasized the importance of a low-salt diet. Encouraged him to increase physical activity to 30 minutes a day 5 days a week as tolerated.  Mixed hyperlipidemia Currently on rosuvastatin.   He denies myalgia or other side effects. Most recent lipids dated 05/2021 reviewed as noted above. Currently managed by primary care provider.  Type 2 diabetes mellitus with other circulatory complication, without long-term current use of insulin (HCC) Currently managed by primary team. Started on statin therapy given his diabetes and elevated 10-year risk of ASCVD. Continue ARB, statin therapy.   FINAL MEDICATION LIST END OF ENCOUNTER: No orders of the defined types were placed in this encounter.   Current Outpatient Medications:    apixaban (ELIQUIS) 5 MG TABS tablet, Take 1 tablet (5 mg total) by mouth 2 (two) times daily., Disp: 60 tablet, Rfl: 0    Ascorbic Acid (VITAMIN C) 1000 MG tablet, Take 1,000 mg by mouth daily., Disp: , Rfl:    blood glucose meter kit and supplies, Dispense based on patient and insurance preference. Use up to four times daily as directed. (FOR ICD-10 E10.9, E11.9)., Disp: 1 each, Rfl: 0   cholecalciferol (VITAMIN D3) 25 MCG (1000 UNIT) tablet, Take 2,000 Units by mouth daily., Disp: , Rfl:    glucose blood (FREESTYLE TEST STRIPS) test strip, Use as instructed, Disp: 100 each, Rfl: 0   Insulin Pen Needle 32G X 4 MM MISC, 1 each by Does not apply route as needed., Disp: 200 each, Rfl: 0   Lancets (FREESTYLE) lancets, Use as instructed, Disp: 100 each, Rfl: 0   Melatonin 10 MG CAPS, Take 10 mg by mouth at bedtime as needed (sleep)., Disp: , Rfl:    metFORMIN (GLUCOPHAGE) 500 MG tablet, Take 1 tablet (500 mg total) by mouth 2 (two) times daily with a meal., Disp: 60 tablet, Rfl: 0   metoprolol tartrate (LOPRESSOR) 50 MG tablet, TAKE 1 TABLET BY MOUTH TWICE A DAY, Disp: 180 tablet, Rfl: 0   Multiple Vitamins-Minerals (ADULT GUMMY PO), Take 2 capsules by mouth daily., Disp: , Rfl:    nitroGLYCERIN (NITROSTAT) 0.4 MG SL tablet, Place 1 tablet (0.4 mg total) under the tongue every 5 (five) minutes as needed for chest pain. If you require more than two tablets five minutes apart go to the nearest ER via EMS., Disp: 30 tablet, Rfl: 0   rosuvastatin (CRESTOR) 20 MG tablet, TAKE 1 TABLET BY MOUTH EVERYDAY AT BEDTIME, Disp: 90 tablet, Rfl: 0   [employer] ASPIRIN LOW STRENGTH 81 MG EC tablet, TAKE 1 TABLET (81 MG TOTAL) BY MOUTH DAILY. SWALLOW WHOLE., Disp: 90 tablet, Rfl: 1  No orders of the defined types were placed in this encounter.  There are no Patient Instructions on file for this visit.   --Continue cardiac medications as reconciled in final medication list. --Return in about 6 months (around 08/11/2022) for Follow up, A. fib. Or sooner if needed. --Continue follow-up with your primary care physician regarding the management of  your other chronic comorbid conditions.  Patient's questions and concerns were addressed to his satisfaction. He voices understanding of the instructions provided during this encounter.   This note was created using a voice recognition software as a result there may be grammatical errors inadvertently enclosed that do not reflect the nature of this encounter. Every attempt is made to correct such errors.  Rex Kras, [place], [facility]  Pager: [phone] Office:  [phone] ° ° °

## 2022-02-12 ENCOUNTER — Other Ambulatory Visit: Payer: Self-pay | Admitting: Cardiology

## 2022-02-12 DIAGNOSIS — I209 Angina pectoris, unspecified: Secondary | ICD-10-CM

## 2022-02-22 ENCOUNTER — Other Ambulatory Visit: Payer: Self-pay | Admitting: Cardiology

## 2022-02-22 DIAGNOSIS — I209 Angina pectoris, unspecified: Secondary | ICD-10-CM

## 2022-02-24 ENCOUNTER — Encounter: Payer: Self-pay | Admitting: Cardiology

## 2022-02-27 ENCOUNTER — Other Ambulatory Visit: Payer: Self-pay | Admitting: Cardiology

## 2022-02-27 DIAGNOSIS — E1159 Type 2 diabetes mellitus with other circulatory complications: Secondary | ICD-10-CM

## 2022-04-20 ENCOUNTER — Other Ambulatory Visit: Payer: Self-pay | Admitting: Cardiology

## 2022-04-20 DIAGNOSIS — I480 Paroxysmal atrial fibrillation: Secondary | ICD-10-CM

## 2022-05-13 ENCOUNTER — Ambulatory Visit: Payer: No Typology Code available for payment source | Admitting: Cardiology

## 2022-05-30 ENCOUNTER — Other Ambulatory Visit: Payer: Self-pay | Admitting: Cardiology

## 2022-05-30 DIAGNOSIS — E1159 Type 2 diabetes mellitus with other circulatory complications: Secondary | ICD-10-CM

## 2022-07-12 ENCOUNTER — Other Ambulatory Visit: Payer: Self-pay | Admitting: Cardiology

## 2022-07-12 DIAGNOSIS — I480 Paroxysmal atrial fibrillation: Secondary | ICD-10-CM

## 2022-08-12 ENCOUNTER — Ambulatory Visit: Payer: No Typology Code available for payment source | Admitting: Cardiology

## 2022-08-12 ENCOUNTER — Encounter: Payer: Self-pay | Admitting: Cardiology

## 2022-08-12 VITALS — BP 131/72 | HR 56 | Temp 97.6°F | Resp 18 | Ht 78.0 in | Wt 262.0 lb

## 2022-08-12 DIAGNOSIS — I10 Essential (primary) hypertension: Secondary | ICD-10-CM

## 2022-08-12 DIAGNOSIS — Z7901 Long term (current) use of anticoagulants: Secondary | ICD-10-CM

## 2022-08-12 DIAGNOSIS — E1159 Type 2 diabetes mellitus with other circulatory complications: Secondary | ICD-10-CM

## 2022-08-12 DIAGNOSIS — E782 Mixed hyperlipidemia: Secondary | ICD-10-CM

## 2022-08-12 DIAGNOSIS — I480 Paroxysmal atrial fibrillation: Secondary | ICD-10-CM

## 2022-08-12 NOTE — Progress Notes (Signed)
ID:  Alejandro Bowen, DOB 23-Jul-1961, MRN [id number]  PCP:  [facility]  Cardiologist: Rex Kras, DO, [facility] (established care 12/24/2020)  Date: 08/12/22 Last Office Visit: 02/11/2022.  Chief Complaint  Patient presents with   Atrial Fibrillation   Follow-up    6 month follow up    HPI  Alejandro Bowen is a 61 y.o. male whose past medical history and cardiovascular risk factors include: Hypertension, non-insulin-dependent diabetes mellitus type 2, hyperlipidemia anxiety, obesity due to excess calories, recent COVID-19 infection, acute hypoxic respiratory failure due to COVID-19 pneumonia, paroxysmal atrial fibrillation.  Patient presents today for 5-monthfollow-up visit for management of paroxysmal atrial fibrillation.  In November 2020 he was diagnosed with COVID-pneumonia and was hospitalized for hypoxemia and COVID-pneumonia and during hospitalization he was diagnosed with new onset of A-fib.  He was started on medical therapy and he converted to normal sinus rhythm.  In the interim he was having symptoms of chest discomfort and did undergo ischemic work-up including a coronary CTA which noted a total CAC of 0 and no epicardial coronary artery disease.  Since last office visit he is doing well from a cardiovascular standpoint.  Does not endorse evidence of bleeding.  He is lost 4 pounds with lifestyle changes.   FUNCTIONAL STATUS: No structured exercise program or daily routine. But is active with his job duties.   ALLERGIES: Allergies  Allergen Reactions   Penicillins Hives   Alprazolam Other (See Comments)    depression    MEDICATION LIST PRIOR TO VISIT: Current Meds  Medication Sig   apixaban (ELIQUIS) 5 MG TABS tablet Take 1 tablet (5 mg total) by mouth 2 (two) times daily.   Ascorbic Acid (VITAMIN C) 1000 MG tablet Take 1,000 mg by mouth daily.   blood glucose meter kit and supplies Dispense based on patient and insurance preference. Use  up to four times daily as directed. (FOR ICD-10 E10.9, E11.9).   cholecalciferol (VITAMIN D3) 25 MCG (1000 UNIT) tablet Take 2,000 Units by mouth daily.   [employer] ASPIRIN LOW STRENGTH 81 MG EC tablet TAKE 1 TABLET (81 MG TOTAL) BY MOUTH DAILY. SWALLOW WHOLE.   glucose blood (FREESTYLE TEST STRIPS) test strip Use as instructed   Insulin Pen Needle 32G X 4 MM MISC 1 each by Does not apply route as needed.   Lancets (FREESTYLE) lancets Use as instructed   Melatonin 10 MG CAPS Take 10 mg by mouth at bedtime as needed (sleep).   metoprolol tartrate (LOPRESSOR) 50 MG tablet TAKE 1 TABLET BY MOUTH TWICE A DAY   Multiple Vitamins-Minerals (ADULT GUMMY PO) Take 2 capsules by mouth daily.   rosuvastatin (CRESTOR) 20 MG tablet TAKE 1 TABLET BY MOUTH EVERYDAY AT BEDTIME     PAST MEDICAL HISTORY: Past Medical History:  Diagnosis Date   Anxiety    Atrial fibrillation ([place]    Diabetes mellitus without complication ([place]    History of COVID-19    Hypertension     PAST SURGICAL HISTORY: History reviewed. No pertinent surgical history.  FAMILY HISTORY: The patient family history includes Dementia in his mother; Heart disease in his father; Hypertension in his brother.  SOCIAL HISTORY:  The patient  reports that he has never smoked. He has never used smokeless tobacco. He reports current alcohol use. He reports that he does not use drugs.  REVIEW OF SYSTEMS: Review of Systems  Constitutional: Negative for chills and fever.  HENT:  Negative for hoarse voice and nosebleeds.  Eyes:  Negative for discharge, double vision and pain.  Cardiovascular:  Negative for chest pain, claudication, dyspnea on exertion, leg swelling, near-syncope, orthopnea, palpitations, paroxysmal nocturnal dyspnea and syncope.  Respiratory:  Positive for snoring. Negative for hemoptysis and shortness of breath.   Musculoskeletal:  Negative for muscle cramps and myalgias.  Gastrointestinal:  Negative for abdominal pain,  constipation, diarrhea, hematemesis, hematochezia, melena, nausea and vomiting.  Neurological:  Negative for dizziness and light-headedness.    PHYSICAL EXAM:    08/12/2022   10:08 AM 02/11/2022    2:19 PM 02/02/2022    1:50 PM  Vitals with BMI  Height 6' 6"  6' 6"    Weight 262 lbs 266 lbs   BMI 53.66 44.03   Systolic 474 259 563  Diastolic 72 82 70  Pulse 56 59 57   Physical Exam  Constitutional: No distress.  Age appropriate, hemodynamically stable.   Neck: No JVD present.  Cardiovascular: Normal rate, regular rhythm, S1 normal, S2 normal, intact distal pulses and normal pulses. Exam reveals no gallop, no S3 and no S4.  No murmur heard. Pulmonary/Chest: Effort normal and breath sounds normal. No stridor. He has no wheezes. He has no rales.  Abdominal: Soft. Bowel sounds are normal. He exhibits no distension. There is no abdominal tenderness.  Musculoskeletal:        General: No edema.     Cervical back: Neck supple.  Neurological: He is alert and oriented to person, place, and time. He has intact cranial nerves (2-12).  Skin: Skin is warm and moist.   CARDIAC DATABASE: EKG: 01/21/2022: Sinus bradycardia, 57 bpm, normal axis, without underlying ischemia or injury pattern.  08/12/22:Sinus Bradycardia, 57bpm, normal axis, PRWP, without underlying injury pattern.   Echocardiogram: 12/01/2020: LVEF 60-65%, no regional wall motion abnormalities, no significant valvular abnormality.  Stress Testing: Exercise Sestamibi stress test 04/05/2021: Exercise nuclear stress test was performed using Bruce protocol. Patient reached 7 METS, and 87% of age predicted maximum heart rate. Exercise capacity was low. No chest pain reported. Heart rate and hemodynamic response were normal. Stress EKG revealed no ischemic changes. Normal stress myocardial perfusion. Stress LVEF 52%. Low risk study.  Heart Catheterization: None  Coronary calcium score 06/04/2021: 1. Coronary calcium score of 0. 2.  Resolution of acute COVID-19 pneumonia related infiltrates with prominent residual bandlike areas of peripheral scarring in both lungs. There may be some mild associated bronchiectasis due to traction in the lower lobes bilaterally. No overt fibrosis is identified. Correlation suggested with any chronic respiratory symptoms following prior COVID pneumonia.  CCTA:  02/02/2022 1. Total coronary calcium score of 0.   2. Normal coronary origin with left dominance.   3. CAD-RADS = 0. No significant epicardial coronary artery disease. Due to artifact the accuracy of the proximal non-dominant RCA, distal and apical LAD is reduced.  Noncardiac findings:  1.  No acute findings in the imaged extracardiac chest. 2. 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 3.  Aortic Atherosclerosis (ICD10-I70.0). 4. Mild hepatic steatosis.  LABORATORY DATA:    Latest Ref Rng & Units 12/24/2020    1:20 PM 12/09/2020    1:21 AM 12/08/2020    5:40 AM  CBC  WBC 3.4 - 10.8 x10E3/uL 5.5  20.7  16.3   Hemoglobin 13.0 - 17.7 g/dL 13.9  16.2  15.8   Hematocrit 37.5 - 51.0 % 41.1  48.0  46.7   Platelets 150 -  450 x10E3/uL 221  243  263        Latest Ref Rng & Units 04/02/2021   12:38 PM 12/24/2020    1:20 PM 12/09/2020    1:21 AM  CMP  Glucose 65 - 99 mg/dL 126  167  76   BUN 6 - 24 mg/dL 16  17  41   Creatinine 0.76 - 1.27 mg/dL 0.93  0.88  1.22   Sodium 134 - 144 mmol/L 141  142  135   Potassium 3.5 - 5.2 mmol/L 4.7  4.4  4.5   Chloride 96 - 106 mmol/L 103  105  101   CO2 20 - 29 mmol/L 22  21  26    Calcium 8.7 - 10.2 mg/dL 9.9  9.7  9.5   Total Protein 6.0 - 8.5 g/dL 6.9  6.4  6.2   Total Bilirubin 0.0 - 1.2 mg/dL 0.3  0.3  1.0   Alkaline Phos 44 - 121 IU/L 95  87  67   AST 0 - 40 IU/L 50  55  44   ALT 0 - 44 IU/L 76  101  102    Lipid Panel     Component Value Date/Time    CHOL 110 06/02/2021 1308   TRIG 113 06/02/2021 1308   HDL 46 06/02/2021 1308   LDLCALC 43 06/02/2021 1308   LDLDIRECT 41 06/02/2021 1309   LABVLDL 21 06/02/2021 1308    No components found for: "NTPROBNP" No results for input(s): "PROBNP" in the last 8760 hours. No results for input(s): "TSH" in the last 8760 hours.   BMP No results for input(s): "NA", "K", "CL", "CO2", "GLUCOSE", "BUN", "CREATININE", "CALCIUM", "GFRNONAA", "GFRAA" in the last 8760 hours.   HEMOGLOBIN A1C Lab Results  Component Value Date   HGBA1C 8.3 (H) 11/30/2020   MPG 191.51 11/30/2020   Lipid profile: Collected: 08/11/2020 Total cholesterol 189, triglycerides 142, HDL 45, LDL 113, non-HDL 144.  IMPRESSION:    ICD-10-CM   1. Paroxysmal atrial fibrillation (HCC)  I48.0 EKG 12-Lead    2. Long term (current) use of anticoagulants  Z79.01     3. Benign hypertension  I10     4. Mixed hyperlipidemia  E78.2     5. Type 2 diabetes mellitus with other circulatory complication, without long-term current use of insulin (HCC)  E11.59         RECOMMENDATIONS: Alejandro Bowen is a 61 y.o. male whose past medical history and cardiac risk factors include: Hypertension, anxiety, obesity due to excess calories, recent COVID-19 infection, acute hypoxic respiratory failure due to COVID-19 pneumonia, NIDDM, paroxysmal atrial fibrillation.  Paroxysmal atrial fibrillation (HCC) Rate control: Metoprolol. Rhythm control: N/A. Thromboembolic prophylaxis: Eliquis. CHA2DS2-VASc SCORE is 2 which correlates to 2.2% risk of stroke per year (HTN, diabetes).  No prior history of cardioversion or atrial fibrillation ablation. ECG today illustrates sinus bradycardia.  Long term (current) use of anticoagulants Indication: Paroxysmal atrial fibrillation. Does not endorse evidence of bleeding. Outside labs from July 2023 independently reviewed renal function stable. Hemoglobin was not checked during July 2023.  He has an  appointment with PCP in October and will have it checked. He was considering left atrial appendage occlusion device (i.e. watchman) as an alternative to anticoagulation but he would like to hold off.  Benign hypertension Office blood pressures are well controlled. Medications reconciled. Reemphasized importance of low-salt diet.  Mixed hyperlipidemia Currently on rosuvastatin. Does not endorse myalgias.  Type 2 diabetes mellitus with other circulatory complication, without  long-term current use of insulin ([place]) Currently managed by PCP. A1c slightly uptrending. Educated on importance of glycemic control. He has a repeat A1c check in October 2023 with PCP. Currently on ARB, statin therapy.  Discussed management of at least 2 chronic comorbid conditions, prior echo and coronary CTA results reviewed, independently reviewed labs from care everywhere from July 2023 and EKG.   FINAL MEDICATION LIST END OF ENCOUNTER: No orders of the defined types were placed in this encounter.   Current Outpatient Medications:    apixaban (ELIQUIS) 5 MG TABS tablet, Take 1 tablet (5 mg total) by mouth 2 (two) times daily., Disp: 60 tablet, Rfl: 0   Ascorbic Acid (VITAMIN C) 1000 MG tablet, Take 1,000 mg by mouth daily., Disp: , Rfl:    blood glucose meter kit and supplies, Dispense based on patient and insurance preference. Use up to four times daily as directed. (FOR ICD-10 E10.9, E11.9)., Disp: 1 each, Rfl: 0   cholecalciferol (VITAMIN D3) 25 MCG (1000 UNIT) tablet, Take 2,000 Units by mouth daily., Disp: , Rfl:    CVS ASPIRIN LOW STRENGTH 81 MG EC tablet, TAKE 1 TABLET (81 MG TOTAL) BY MOUTH DAILY. SWALLOW WHOLE., Disp: 90 tablet, Rfl: 1   glucose blood (FREESTYLE TEST STRIPS) test strip, Use as instructed, Disp: 100 each, Rfl: 0   Insulin Pen Needle 32G X 4 MM MISC, 1 each by Does not apply route as needed., Disp: 200 each, Rfl: 0   Lancets (FREESTYLE) lancets, Use as instructed, Disp: 100 each, Rfl:  0   Melatonin 10 MG CAPS, Take 10 mg by mouth at bedtime as needed (sleep)., Disp: , Rfl:    metoprolol tartrate (LOPRESSOR) 50 MG tablet, TAKE 1 TABLET BY MOUTH TWICE A DAY, Disp: 60 tablet, Rfl: 2   Multiple Vitamins-Minerals (ADULT GUMMY PO), Take 2 capsules by mouth daily., Disp: , Rfl:    rosuvastatin (CRESTOR) 20 MG tablet, TAKE 1 TABLET BY MOUTH EVERYDAY AT BEDTIME, Disp: 90 tablet, Rfl: 0   metFORMIN (GLUCOPHAGE) 500 MG tablet, Take 1 tablet (500 mg total) by mouth 2 (two) times daily with a meal., Disp: 60 tablet, Rfl: 0   nitroGLYCERIN (NITROSTAT) 0.4 MG SL tablet, Place 1 tablet (0.4 mg total) under the tongue every 5 (five) minutes as needed for chest pain. If you require more than two tablets five minutes apart go to the nearest ER via EMS., Disp: 30 tablet, Rfl: 0  Orders Placed This Encounter  Procedures   EKG 12-Lead    There are no Patient Instructions on file for this visit.   --Continue cardiac medications as reconciled in final medication list. --Return in about 6 months (around 02/12/2023) for Follow up, A. fib. Or sooner if needed. --Continue follow-up with your primary care physician regarding the management of your other chronic comorbid conditions.  Patient's questions and concerns were addressed to his satisfaction. He voices understanding of the instructions provided during this encounter.   This note was created using a voice recognition software as a result there may be grammatical errors inadvertently enclosed that do not reflect the nature of this encounter. Every attempt is made to correct such errors.  Rex Kras, [place], [facility]  Pager: [phone] Office: [phone]

## 2022-09-06 ENCOUNTER — Other Ambulatory Visit: Payer: Self-pay | Admitting: Cardiology

## 2022-09-06 DIAGNOSIS — E1159 Type 2 diabetes mellitus with other circulatory complications: Secondary | ICD-10-CM

## 2022-10-03 ENCOUNTER — Other Ambulatory Visit: Payer: Self-pay | Admitting: Cardiology

## 2022-10-03 DIAGNOSIS — I480 Paroxysmal atrial fibrillation: Secondary | ICD-10-CM

## 2022-11-02 ENCOUNTER — Other Ambulatory Visit: Payer: Self-pay | Admitting: Cardiology

## 2022-11-02 DIAGNOSIS — E1159 Type 2 diabetes mellitus with other circulatory complications: Secondary | ICD-10-CM

## 2022-12-22 IMAGING — CT CT HEART MORP W/ CTA COR W/ SCORE W/ CA W/CM &/OR W/O CM
4 of 7 series · 8 of 20 positions shown, 9 images · IV contrast (omnipaque)
Comparison: 06/04/2021 calcium score CT
COMPARISON: 06/04/2021 calcium score CT

Addendum:
EXAM:
OVER-READ INTERPRETATION  CT CHEST

The following report is an over-read performed by radiologist Dr.
Osman Jemison [[email or web address]] on 02/02/2022. This over-read
does not include interpretation of cardiac or coronary anatomy or
pathology. The coronary CTA interpretation by the cardiologist is
attached.
HISTORY: Chest pain, nonspecific
Cardiac/Coronary  CT
TECHNIQUE: The patient was scanned on a Siemens Force scanner.
PROTOCOL: A 120 kV prospective scan was triggered in the descending thoracic
aorta at 111 HU's. Axial non-contrast 3 mm slices were carried out
through the heart. The data set was analyzed on a dedicated work
station and scored using the Agatson method. Gantry rotation speed
was 250 msecs and collimation was .6 mm. No IV beta blockade but
mg of sl NTG was given. The 3D data set was reconstructed in 5%
intervals of the 67-82 % of the R-R cycle. Diastolic phases were
analyzed on a dedicated work station using MPR, MIP and VRT modes.
The patient received 95mL OMNIPAQUE IOHEXOL 350 MG/ML SOLN of
contrast.

[Series 6: ts diast sharp · axial · 0.39mm/px · z∈[-26,+13]mm · 2 of 291 slices shown]
[im 97/291  lung]
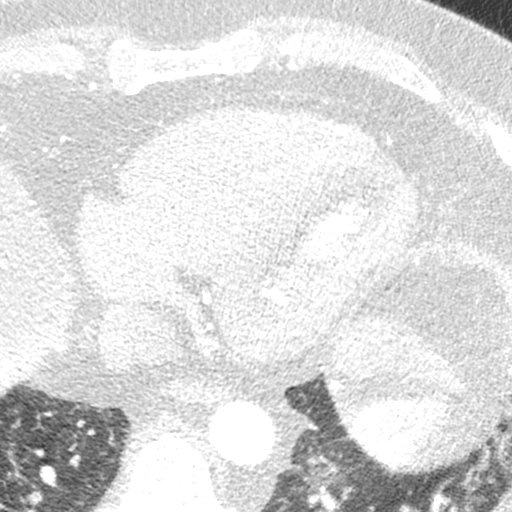
[im 194/291  lung]
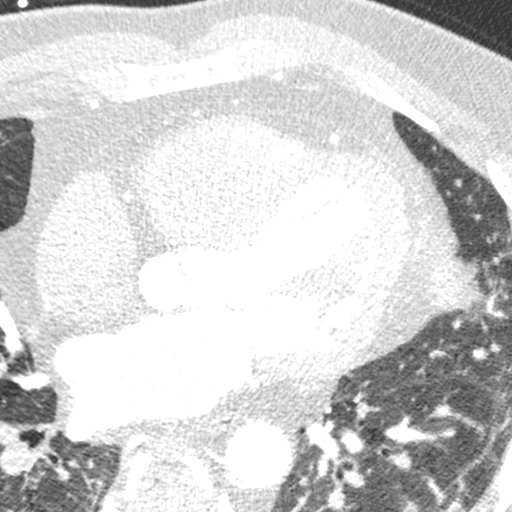

[Series 7: ts syst sharp · axial · 0.39mm/px · z∈[-26,+13]mm · 2 of 291 slices shown]
[im 97/291  lung]
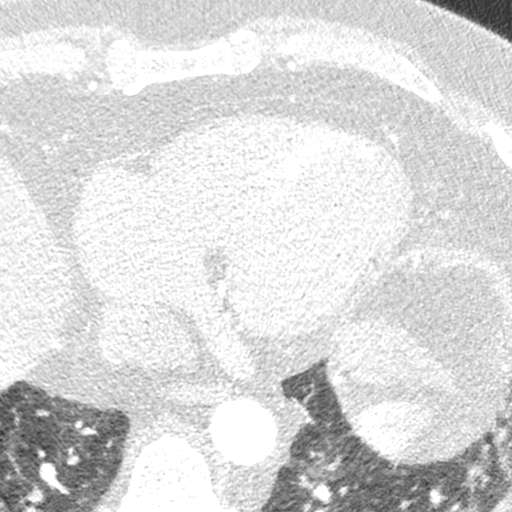
[im 194/291  lung]
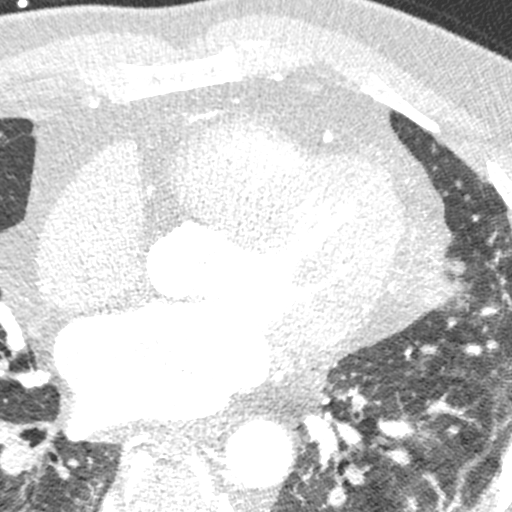

[Series 8: best syst · axial · 0.39mm/px · z∈[-26,+13]mm · 2 of 291 slices shown, 3 images]
[im 97/291  vessel]
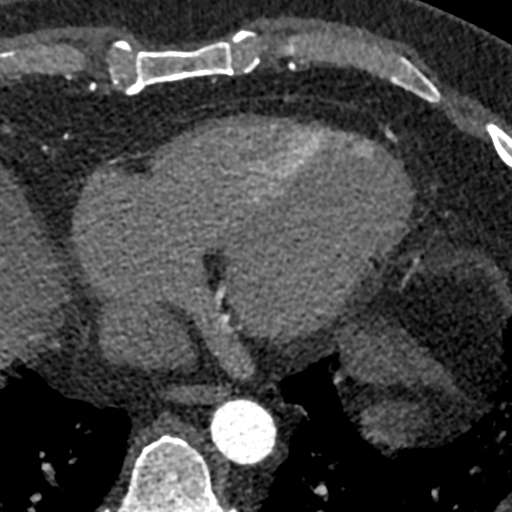
[im 97/291  lung]
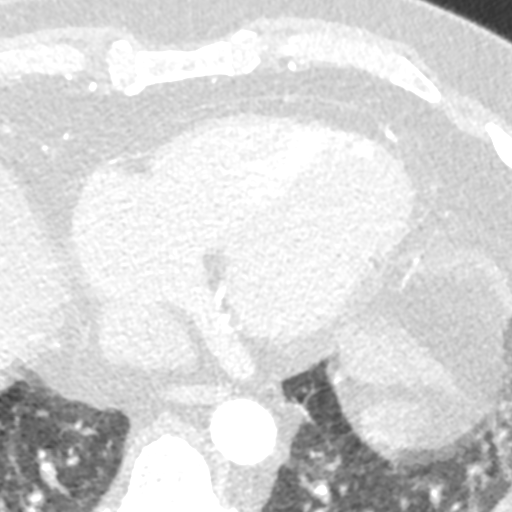
[im 194/291  vessel]
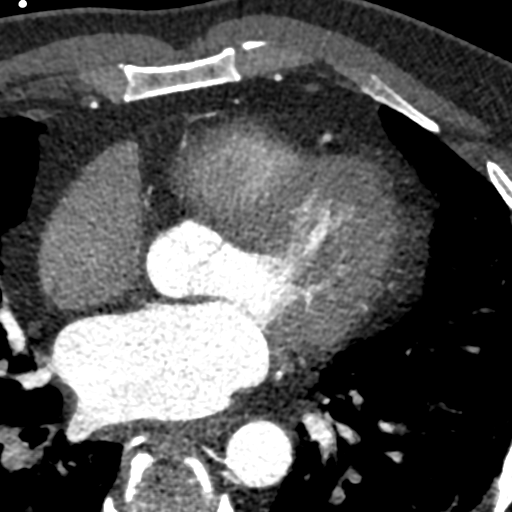

[Series 9: best diast · axial · 0.39mm/px · z∈[-26,+13]mm · 2 of 291 slices shown]
[im 97/291  vessel]
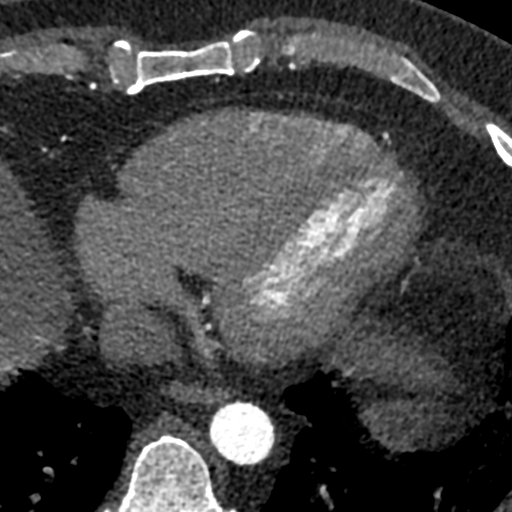
[im 194/291  vessel]
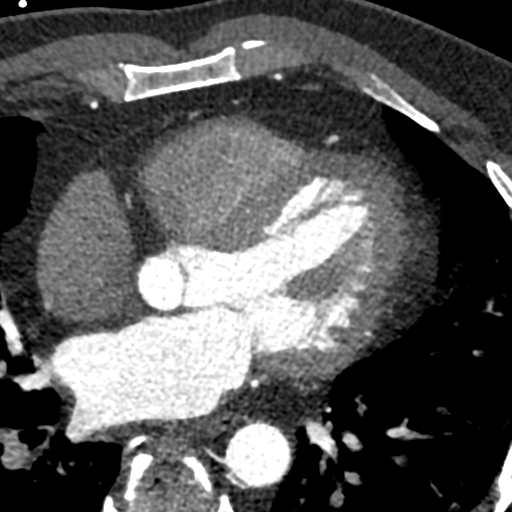

[8 of 20 positions shown; findings below may reference images not displayed]

FINDINGS: Vascular: Aortic atherosclerosis. No central pulmonary embolism, on
this non-dedicated study.

Mediastinum/Nodes: No imaged thoracic adenopathy.

Lungs/Pleura: No pleural fluid. Peripheral and basilar predominant
subpleural linear opacities with mild volume loss and architectural
distortion.

Upper Abdomen: Mild hepatic steatosis. Normal imaged portions of the
spleen, stomach.

Musculoskeletal: No acute osseous abnormality.
IMPRESSION: 1.  No acute findings in the imaged extracardiac chest.
2. Redemonstration of peripheral and basilar predominant linear
opacities with architectural distortion and volume loss, most
consistent with mild post PWBJ6-TF fibrosis.
3.  Aortic Atherosclerosis (448SH-X2T.T).
4. Mild hepatic steatosis.
FINDINGS: Image quality: Average.

Artifact: Limited.

Coronary artery calcification score: Total coronary calcium score of
0.

Coronary arteries: Normal coronary origins.  Left dominance.

Left Main Coronary Artery: The left main is a normal caliber vessel
with a normal take off from the left coronary cusp that trifurcates
into a LAD, LCX, and ramus intermedius. There is no plaque or
stenosis.

Left Anterior Descending Coronary Artery: Normal caliber vessel,
wraps the apex, gives off 2 patent diagonal branches. The LAD is
patent without evidence of plaque or stenosis; however,accuracy of
distal and apical LAD is limited due to artifact.

Ramus intermedius: Normal caliber, normal size, patent with no
evidence of plaque or stenosis.

Left Circumflex Artery: Normal caliber vessel, dominant, travels
within the atrioventricular groove, gives off 1 patent obtuse
marginal branches, L-PDA and L-PLB. The LCX is patent with no
evidence of plaque or stenosis.

Right Coronary Artery: The RCA is non-dominant with normal take off
from the right coronary cusp. Overall there is no evidence of plaque
or stenosis; however, accuracy of proximal RCA is limited to
artifact.

Left Atrium: Grossly normal in size with no left atrial appendage
filling defect.

Left Ventricle: Grossly normal in size. There are no stigmata of
prior infarction. There is no abnormal filling defect.

Pulmonary arteries: Normal in size without proximal filling defect.

Pulmonary veins: Normal pulmonary venous drainage.

Aorta: Normal size, 33.2 mm at the mid ascending aorta (level of the
PA bifurcation) measured double oblique. No calcifications. No
dissection.

Pericardium: Normal thickness with no significant effusion or
calcium present.

Cardiac valves: The aortic valve is trileaflet without
calcification. The mitral valve is normal structure without
calcification.

Extra-cardiac findings: See attached radiology report for
non-cardiac structures.
IMPRESSION: 1. Total coronary calcium score of 0.

2. Normal coronary origin with left dominance.

3. CAD-RADS = 0. No significant epicardial coronary artery disease.
Due to artifact the accuracy of the proximal non-dominant RCA,
distal and apical LAD is reduced.

RECOMMENDATIONS:

Consider non-atherosclerotic causes of chest pain.

*** End of Addendum ***
EXAM:
OVER-READ INTERPRETATION  CT CHEST

The following report is an over-read performed by radiologist Dr.
Osman Jemison [[email or web address]] on 02/02/2022. This over-read
does not include interpretation of cardiac or coronary anatomy or
pathology. The coronary CTA interpretation by the cardiologist is
attached.
FINDINGS: Vascular: Aortic atherosclerosis. No central pulmonary embolism, on
this non-dedicated study.

Mediastinum/Nodes: No imaged thoracic adenopathy.

Lungs/Pleura: No pleural fluid. Peripheral and basilar predominant
subpleural linear opacities with mild volume loss and architectural
distortion.

Upper Abdomen: Mild hepatic steatosis. Normal imaged portions of the
spleen, stomach.

Musculoskeletal: No acute osseous abnormality.
IMPRESSION: 1.  No acute findings in the imaged extracardiac chest.
2. Redemonstration of peripheral and basilar predominant linear
opacities with architectural distortion and volume loss, most
consistent with mild post PWBJ6-TF fibrosis.
3.  Aortic Atherosclerosis (448SH-X2T.T).
4. Mild hepatic steatosis.

## 2022-12-27 ENCOUNTER — Other Ambulatory Visit: Payer: Self-pay | Admitting: Cardiology

## 2022-12-27 DIAGNOSIS — I480 Paroxysmal atrial fibrillation: Secondary | ICD-10-CM

## 2023-01-26 ENCOUNTER — Other Ambulatory Visit: Payer: Self-pay | Admitting: Cardiology

## 2023-01-26 DIAGNOSIS — E1159 Type 2 diabetes mellitus with other circulatory complications: Secondary | ICD-10-CM

## 2023-02-10 ENCOUNTER — Ambulatory Visit: Payer: PRIVATE HEALTH INSURANCE | Admitting: Cardiology

## 2023-02-10 ENCOUNTER — Encounter: Payer: Self-pay | Admitting: Cardiology

## 2023-02-10 ENCOUNTER — Ambulatory Visit: Payer: Self-pay | Admitting: Cardiology

## 2023-02-10 VITALS — BP 133/80 | HR 63 | Resp 16 | Ht 78.0 in | Wt 270.0 lb

## 2023-02-10 DIAGNOSIS — Z7901 Long term (current) use of anticoagulants: Secondary | ICD-10-CM

## 2023-02-10 DIAGNOSIS — E782 Mixed hyperlipidemia: Secondary | ICD-10-CM

## 2023-02-10 DIAGNOSIS — I480 Paroxysmal atrial fibrillation: Secondary | ICD-10-CM

## 2023-02-10 DIAGNOSIS — I10 Essential (primary) hypertension: Secondary | ICD-10-CM

## 2023-02-10 DIAGNOSIS — E1159 Type 2 diabetes mellitus with other circulatory complications: Secondary | ICD-10-CM

## 2023-02-10 NOTE — Progress Notes (Unsigned)
ID:  Richter Rozek, DOB [birth year], MRN [id number]  PCP:  [facility]  Cardiologist: Rex Kras, DO, [facility] (established care 12/24/2020)  Date: 02/10/23 Last Office Visit: 08/12/2022  Chief Complaint  Patient presents with   Atrial Fibrillation   Follow-up    6 month    HPI  Kal Wassmuth is a 62 y.o. male whose past medical history and cardiovascular risk factors include: Hypertension, non-insulin-dependent diabetes mellitus type 2, hyperlipidemia anxiety, obesity due to excess calories, recent COVID-19 infection, acute hypoxic respiratory failure due to COVID-19 pneumonia, paroxysmal atrial fibrillation.  Patient presents today for 36-monthfollow-up visit for management of paroxysmal atrial fibrillation.  Patient is accompanied by his wife at today's office visit.  Since last office visit he has not had any anginal discomfort, heart failure symptoms, or symptoms consistent with atrial fibrillation.  He has undergone appropriate cardiovascular workup as outlined below.  He was diagnosed with mild sleep apnea according to the patient and CPAP was recommended.  However compliance has been an issue.  He does not endorse any evidence of bleeding.  He had recent labs with PCP these were independently reviewed and noted below.  Hemoglobin levels were not checked for reasons unknown.  Unfortunately, he has gained weight since last office visit likely due to dietary indiscretion.  FUNCTIONAL STATUS: No structured exercise program or daily routine. But is active with his job duties.   ALLERGIES: Allergies  Allergen Reactions   Penicillins Hives   Alprazolam Other (See Comments)    depression    MEDICATION LIST PRIOR TO VISIT: Current Meds  Medication Sig   apixaban (ELIQUIS) 5 MG TABS tablet Take 1 tablet (5 mg total) by mouth 2 (two) times daily.   Ascorbic Acid (VITAMIN C) 1000 MG tablet Take 1,000 mg by mouth daily.   blood glucose meter kit  and supplies Dispense based on patient and insurance preference. Use up to four times daily as directed. (FOR ICD-10 E10.9, E11.9).   cholecalciferol (VITAMIN D3) 25 MCG (1000 UNIT) tablet Take 2,000 Units by mouth daily.   citalopram (CELEXA) 20 MG tablet Take 20 mg by mouth daily.   glipiZIDE (GLUCOTROL XL) 2.5 MG 24 hr tablet Take 2.5 mg by mouth daily with breakfast.   glucose blood (FREESTYLE TEST STRIPS) test strip Use as instructed   Insulin Pen Needle 32G X 4 MM MISC 1 each by Does not apply route as needed.   Lancets (FREESTYLE) lancets Use as instructed   metoprolol tartrate (LOPRESSOR) 50 MG tablet TAKE 1 TABLET BY MOUTH TWICE A DAY   Multiple Vitamins-Minerals (ADULT GUMMY PO) Take 2 capsules by mouth daily.   nitroGLYCERIN (NITROSTAT) 0.4 MG SL tablet Place 1 tablet (0.4 mg total) under the tongue every 5 (five) minutes as needed for chest pain. If you require more than two tablets five minutes apart go to the nearest ER via EMS.   rosuvastatin (CRESTOR) 20 MG tablet TAKE 1 TABLET BY MOUTH EVERYDAY AT BEDTIME     PAST MEDICAL HISTORY: Past Medical History:  Diagnosis Date   Anxiety    Atrial fibrillation ([place]    Diabetes mellitus without complication ([facility] [place]    History of COVID-19    Hypertension     PAST SURGICAL HISTORY: History reviewed. No pertinent surgical history.  FAMILY HISTORY: The patient family history includes Dementia in his mother; Heart disease in his father; Hypertension in his brother.  SOCIAL HISTORY:  The patient  reports that he has never smoked.  He has never used smokeless tobacco. He reports current alcohol use. He reports that he does not use drugs.  REVIEW OF SYSTEMS: Review of Systems  Constitutional: Negative for chills and fever.  HENT:  Negative for hoarse voice and nosebleeds.   Eyes:  Negative for discharge, double vision and pain.  Cardiovascular:  Negative for chest pain, claudication, dyspnea on exertion, leg swelling, near-syncope,  orthopnea, palpitations, paroxysmal nocturnal dyspnea and syncope.  Respiratory:  Positive for snoring. Negative for hemoptysis and shortness of breath.   Musculoskeletal:  Negative for muscle cramps and myalgias.  Gastrointestinal:  Negative for abdominal pain, constipation, diarrhea, hematemesis, hematochezia, melena, nausea and vomiting.  Neurological:  Negative for dizziness and light-headedness.    PHYSICAL EXAM:    02/10/2023   10:04 AM 08/12/2022   10:08 AM 02/11/2022    2:19 PM  Vitals with BMI  Height 6' 6"$  6' 6"$  6' 6"$   Weight 270 lbs 262 lbs 266 lbs  BMI 31.21 [id number]  Systolic Q000111Q [id number]  Diastolic 80 72 82  Pulse 63 56 59   Physical Exam  Constitutional: No distress.  Age appropriate, hemodynamically stable.   Neck: No JVD present.  Cardiovascular: Normal rate, regular rhythm, S1 normal, S2 normal, intact distal pulses and normal pulses. Exam reveals no gallop, no S3 and no S4.  No murmur heard. Pulmonary/Chest: Effort normal and breath sounds normal. No stridor. He has no wheezes. He has no rales.  Abdominal: Soft. Bowel sounds are normal. He exhibits no distension. There is no abdominal tenderness.  Musculoskeletal:        General: No edema.     Cervical back: Neck supple.  Neurological: He is alert and oriented to person, place, and time. He has intact cranial nerves (2-12).  Skin: Skin is warm and moist.   CARDIAC DATABASE: EKG: 02/10/2023: Sinus rhythm, 60 bpm, normal axis, without underlying ischemia or injury pattern.  Echocardiogram: 12/01/2020: LVEF 60-65%, no regional wall motion abnormalities, no significant valvular abnormality.  Stress Testing: Exercise Sestamibi stress test 04/05/2021: Exercise nuclear stress test was performed using Bruce protocol. Patient reached 7 METS, and 87% of age predicted maximum heart rate. Exercise capacity was low. No chest pain reported. Heart rate and hemodynamic response were normal. Stress EKG revealed no ischemic  changes. Normal stress myocardial perfusion. Stress LVEF 52%. Low risk study.  Heart Catheterization: None  Coronary calcium score 06/04/2021: 1. Coronary calcium score of 0. 2. Resolution of acute COVID-19 pneumonia related infiltrates with prominent residual bandlike areas of peripheral scarring in both lungs. There may be some mild associated bronchiectasis due to traction in the lower lobes bilaterally. No overt fibrosis is identified. Correlation suggested with any chronic respiratory symptoms following prior COVID pneumonia.  CCTA:  02/02/2022 1. Total coronary calcium score of 0.   2. Normal coronary origin with left dominance.   3. CAD-RADS = 0. No significant epicardial coronary artery disease. Due to artifact the accuracy of the proximal non-dominant RCA, distal and apical LAD is reduced.  Noncardiac findings:  1.  No acute findings in the imaged extracardiac chest. 2. 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 3.  Aortic Atherosclerosis (ICD10-I70.0). 4. Mild hepatic steatosis.  LABORATORY DATA:  Lipid profile: Collected: 08/11/2020 Total cholesterol 189, triglycerides 142, HDL 45, LDL 113, non-HDL 144.  External Labs: Collected: 01/20/2023 [facility]. A1c 7.6 Total cholesterol 125, triglycerides 134,  HDL 48, LDL direct 66, non-HDL 77 Sodium 138, potassium 4.8, chloride 103, bicarb 27, BUN 21, creatinine 0.9. AST 33, ALT 47, alkaline phosphatase 81  IMPRESSION:    ICD-10-CM   1. Paroxysmal atrial fibrillation (HCC)  I48.0 EKG 12-Lead    2. Long term (current) use of anticoagulants  Z79.01 Hemoglobin and hematocrit, blood    3. Benign hypertension  I10     4. Mixed hyperlipidemia  E78.2     5. Type 2 diabetes mellitus with other circulatory complication, without long-term current use of insulin (HCC)   E11.59         RECOMMENDATIONS: Keithan Giovanelli is a 62 y.o. male whose past medical history and cardiac risk factors include: Hypertension, anxiety, obesity due to excess calories, recent COVID-19 infection, acute hypoxic respiratory failure due to COVID-19 pneumonia, NIDDM, paroxysmal atrial fibrillation.  Paroxysmal atrial fibrillation (HCC) Rate control: Metoprolol. Rhythm control: N/A. Thromboembolic prophylaxis: Eliquis. Diagnosed in November 2020 CHA2DS2-VASc SCORE is 2 which correlates to 2.2% risk of stroke per year (HTN, diabetes).  No prior history of cardioversion or ablation. ECG today illustrates sinus without ectopy. Despite management of atrial fibrillation reemphasized the importance of weight loss, addressing his sleep apnea, and management of comorbid conditions.  Long term (current) use of anticoagulants Indication: Paroxysmal atrial fibrillation. Does not endorse evidence of bleeding. Outside labs from Jan 2024 independently reviewed as noted above.  Hemoglobin was not checked during January 2023.   Will check hemoglobin and hematocrit.  Benign hypertension Office blood pressures are well controlled. Medications reconciled. Reemphasized importance of low-salt diet.  Mixed hyperlipidemia Currently on rosuvastatin. Does not endorse myalgias.  Type 2 diabetes mellitus with other circulatory complication, without long-term current use of insulin ([place]) Currently managed by PCP. A1c slightly uptrending. Educated on importance of glycemic control. Most recent hemoglobin A1c reviewed.   Currently on ARB, statin therapy. Given his atrial fibrillation, diabetes, I have asked him to discuss the role of Ozempic/Wegovy/Rybelsus with PCP to help facilitate weight loss.  From a cardiovascular standpoint he is doing well.  Ideally would like to see him back on an annual basis.  But I have asked him to space his well visit/annual physical and my follow-up by 6 months so  each provider can do labs and make sure that his hemoglobin and renal function are adequate as he is currently on anticoagulation.  FINAL MEDICATION LIST END OF ENCOUNTER: No orders of the defined types were placed in this encounter.   Current Outpatient Medications:    apixaban (ELIQUIS) 5 MG TABS tablet, Take 1 tablet (5 mg total) by mouth 2 (two) times daily., Disp: 60 tablet, Rfl: 0   Ascorbic Acid (VITAMIN C) 1000 MG tablet, Take 1,000 mg by mouth daily., Disp: , Rfl:    blood glucose meter kit and supplies, Dispense based on patient and insurance preference. Use up to four times daily as directed. (FOR ICD-10 E10.9, E11.9)., Disp: 1 each, Rfl: 0   cholecalciferol (VITAMIN D3) 25 MCG (1000 UNIT) tablet, Take 2,000 Units by mouth daily., Disp: , Rfl:    citalopram (CELEXA) 20 MG tablet, Take 20 mg by mouth daily., Disp: , Rfl:    glipiZIDE (GLUCOTROL XL) 2.5 MG 24 hr tablet, Take 2.5 mg by mouth daily with breakfast., Disp: , Rfl:    glucose blood (FREESTYLE TEST STRIPS) test strip, Use as instructed, Disp: 100 each, Rfl: 0   Insulin Pen Needle 32G X 4 MM MISC, 1 each by Does not apply route  as needed., Disp: 200 each, Rfl: 0   Lancets (FREESTYLE) lancets, Use as instructed, Disp: 100 each, Rfl: 0   metoprolol tartrate (LOPRESSOR) 50 MG tablet, TAKE 1 TABLET BY MOUTH TWICE A DAY, Disp: 60 tablet, Rfl: 2   Multiple Vitamins-Minerals (ADULT GUMMY PO), Take 2 capsules by mouth daily., Disp: , Rfl:    nitroGLYCERIN (NITROSTAT) 0.4 MG SL tablet, Place 1 tablet (0.4 mg total) under the tongue every 5 (five) minutes as needed for chest pain. If you require more than two tablets five minutes apart go to the nearest ER via EMS., Disp: 30 tablet, Rfl: 0   rosuvastatin (CRESTOR) 20 MG tablet, TAKE 1 TABLET BY MOUTH EVERYDAY AT BEDTIME, Disp: 30 tablet, Rfl: 2  Orders Placed This Encounter  Procedures   Hemoglobin and hematocrit, blood   EKG 12-Lead    There are no Patient Instructions on file for  this visit.   --Continue cardiac medications as reconciled in final medication list. --Return in about 6 months (around 08/11/2023) for Follow up, A. fib. Or sooner if needed. --Continue follow-up with your primary care physician regarding the management of your other chronic comorbid conditions.  Patient's questions and concerns were addressed to his satisfaction. He voices understanding of the instructions provided during this encounter.   This note was created using a voice recognition software as a result there may be grammatical errors inadvertently enclosed that do not reflect the nature of this encounter. Every attempt is made to correct such errors.  Rex Kras, [place], [facility]  Pager: [phone] Office: [phone]

## 2023-03-25 ENCOUNTER — Other Ambulatory Visit: Payer: Self-pay | Admitting: Cardiology

## 2023-03-25 DIAGNOSIS — I480 Paroxysmal atrial fibrillation: Secondary | ICD-10-CM

## 2023-04-10 ENCOUNTER — Other Ambulatory Visit: Payer: Self-pay | Admitting: Cardiology

## 2023-04-10 DIAGNOSIS — E1159 Type 2 diabetes mellitus with other circulatory complications: Secondary | ICD-10-CM

## 2023-05-31 ENCOUNTER — Other Ambulatory Visit: Payer: Self-pay | Admitting: Cardiology

## 2023-05-31 DIAGNOSIS — I480 Paroxysmal atrial fibrillation: Secondary | ICD-10-CM

## 2023-07-20 ENCOUNTER — Other Ambulatory Visit: Payer: Self-pay | Admitting: Cardiology

## 2023-07-20 DIAGNOSIS — E1159 Type 2 diabetes mellitus with other circulatory complications: Secondary | ICD-10-CM

## 2023-08-11 ENCOUNTER — Ambulatory Visit: Payer: PRIVATE HEALTH INSURANCE | Admitting: Cardiology

## 2023-08-14 ENCOUNTER — Ambulatory Visit: Payer: PRIVATE HEALTH INSURANCE | Admitting: Cardiology

## 2023-08-14 ENCOUNTER — Encounter: Payer: Self-pay | Admitting: Cardiology

## 2023-08-14 VITALS — BP 134/83 | HR 55 | Resp 16 | Ht 78.0 in | Wt 268.0 lb

## 2023-08-14 DIAGNOSIS — E1159 Type 2 diabetes mellitus with other circulatory complications: Secondary | ICD-10-CM

## 2023-08-14 DIAGNOSIS — I480 Paroxysmal atrial fibrillation: Secondary | ICD-10-CM

## 2023-08-14 DIAGNOSIS — E782 Mixed hyperlipidemia: Secondary | ICD-10-CM

## 2023-08-14 DIAGNOSIS — I10 Essential (primary) hypertension: Secondary | ICD-10-CM

## 2023-08-14 DIAGNOSIS — Z7901 Long term (current) use of anticoagulants: Secondary | ICD-10-CM

## 2023-08-14 NOTE — Progress Notes (Signed)
ID:  Alejandro Bowen, DOB 1961/05/15, MRN [id number]  PCP:  [facility]  Cardiologist: Tessa Lerner, DO, [facility] (established care 12/24/2020)  Date: 08/14/23 Last Office Visit: 02/10/2023  Chief Complaint  Patient presents with   Paroxysmal atrial fibrillation [facility])    HPI  Alejandro Bowen is a 62 y.o. male whose past medical history and cardiovascular risk factors include: Hypertension, non-insulin-dependent diabetes mellitus type 2, hyperlipidemia anxiety, obesity due to excess calories, recent COVID-19 infection, acute hypoxic respiratory failure due to COVID-19 pneumonia, paroxysmal atrial fibrillation.  Patient is being followed by the practice for paroxysmal atrial fibrillation.  With regards to atrial fibrillation he is currently on metoprolol for rate control strategy and Eliquis for thromboembolic prophylaxis.  Since last office visit he is doing well from a cardiovascular standpoint.  Denies anginal chest pain or heart failure symptoms.  FUNCTIONAL STATUS: No structured exercise program or daily routine. But is active with his job duties.   ALLERGIES: Allergies  Allergen Reactions   Penicillins Hives   Alprazolam Other (See Comments)    depression    MEDICATION LIST PRIOR TO VISIT: Current Meds  Medication Sig   apixaban (ELIQUIS) 5 MG TABS tablet Take 1 tablet (5 mg total) by mouth 2 (two) times daily.   Ascorbic Acid (VITAMIN C) 1000 MG tablet Take 1,000 mg by mouth daily.   blood glucose meter kit and supplies Dispense based on patient and insurance preference. Use up to four times daily as directed. (FOR ICD-10 E10.9, E11.9).   cholecalciferol (VITAMIN D3) 25 MCG (1000 UNIT) tablet Take 2,000 Units by mouth daily.   citalopram (CELEXA) 20 MG tablet Take 20 mg by mouth daily.   glipiZIDE (GLUCOTROL XL) 2.5 MG 24 hr tablet Take 2.5 mg by mouth daily with breakfast.   glucose blood (FREESTYLE TEST STRIPS) test strip Use as instructed    Insulin Pen Needle 32G X 4 MM MISC 1 each by Does not apply route as needed.   Lancets (FREESTYLE) lancets Use as instructed   metoprolol tartrate (LOPRESSOR) 50 MG tablet TAKE 1 TABLET BY MOUTH TWICE A DAY   Multiple Vitamins-Minerals (ADULT GUMMY PO) Take 2 capsules by mouth daily.   rosuvastatin (CRESTOR) 20 MG tablet TAKE 1 TABLET BY MOUTH EVERYDAY AT BEDTIME     PAST MEDICAL HISTORY: Past Medical History:  Diagnosis Date   Anxiety    Atrial fibrillation (HCC)    Diabetes mellitus without complication (HCC)    History of COVID-19    Hypertension     PAST SURGICAL HISTORY: History reviewed. No pertinent surgical history.  FAMILY HISTORY: The patient family history includes Dementia in his mother; Heart disease in his father; Hypertension in his brother.  SOCIAL HISTORY:  The patient  reports that he has never smoked. He has never used smokeless tobacco. He reports current alcohol use. He reports that he does not use drugs.  REVIEW OF SYSTEMS: Review of Systems  Constitutional: Positive for weight loss.  Cardiovascular:  Negative for chest pain, claudication, dyspnea on exertion, irregular heartbeat, leg swelling, near-syncope, orthopnea, palpitations, paroxysmal nocturnal dyspnea and syncope.  Respiratory:  Positive for snoring. Negative for shortness of breath.   Hematologic/Lymphatic: Negative for bleeding problem.  Musculoskeletal:  Negative for muscle cramps and myalgias.  Neurological:  Negative for dizziness and light-headedness.    PHYSICAL EXAM:    08/14/2023   10:00 AM 02/10/2023   10:04 AM 08/12/2022   10:08 AM  Vitals with BMI  Height 6\' 6"   6\' 6"  6\' 6"   Weight 268 lbs 270 lbs 262 lbs  BMI 30.98 31.21 30.28  Systolic 134 133 161  Diastolic 83 80 72  Pulse 55 63 56   Physical Exam  Constitutional: No distress.  Age appropriate, hemodynamically stable.   Neck: No JVD present.  Cardiovascular: Regular rhythm, S1 normal, S2 normal, intact distal pulses and  normal pulses. Bradycardia present. Exam reveals no gallop, no S3 and no S4.  No murmur heard. Pulmonary/Chest: Effort normal and breath sounds normal. No stridor. He has no wheezes. He has no rales.  Abdominal: Soft. Bowel sounds are normal. He exhibits no distension. There is no abdominal tenderness.  Musculoskeletal:        General: No edema.     Cervical back: Neck supple.  Neurological: He is alert and oriented to person, place, and time. He has intact cranial nerves (2-12).  Skin: Skin is warm and moist.   CARDIAC DATABASE: EKG: 08/14/2023: Sinus bradycardia, 59 bpm, without underlying ischemia injury pattern.  Echocardiogram: 12/01/2020: LVEF 60-65%, no regional wall motion abnormalities, no significant valvular abnormality.  Stress Testing: Exercise Sestamibi stress test 04/05/2021: Exercise nuclear stress test was performed using Bruce protocol. Patient reached 7 METS, and 87% of age predicted maximum heart rate. Exercise capacity was low. No chest pain reported. Heart rate and hemodynamic response were normal. Stress EKG revealed no ischemic changes. Normal stress myocardial perfusion. Stress LVEF 52%. Low risk study.  Heart Catheterization: None  Coronary calcium score 06/04/2021: 1. Coronary calcium score of 0. 2. Resolution of acute COVID-19 pneumonia related infiltrates with prominent residual bandlike areas of peripheral scarring in both lungs. There may be some mild associated bronchiectasis due to traction in the lower lobes bilaterally. No overt fibrosis is identified. Correlation suggested with any chronic respiratory symptoms following prior COVID pneumonia.  CCTA:  02/02/2022 1. Total coronary calcium score of 0.   2. Normal coronary origin with left dominance.   3. CAD-RADS = 0. No significant epicardial coronary artery disease. Due to artifact the accuracy of the proximal non-dominant RCA, distal and apical LAD is reduced.  Noncardiac findings:  1.  No acute  findings in the imaged extracardiac chest. 2. 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 3.  Aortic Atherosclerosis (ICD10-I70.0). 4. Mild hepatic steatosis.  LABORATORY DATA:  Lipid profile: Collected: 08/11/2020 Total cholesterol 189, triglycerides 142, HDL 45, LDL 113, non-HDL 144.  External Labs: Collected: 01/20/2023 [facility]. A1c 7.6 Total cholesterol 125, triglycerides 134, HDL 48, LDL direct 66, non-HDL 77 Sodium 138, potassium 4.8, chloride 103, bicarb 27, BUN 21, creatinine 0.9. AST 33, ALT 47, alkaline phosphatase 81  IMPRESSION:    ICD-10-CM   1. Paroxysmal atrial fibrillation (HCC)  I48.0 EKG 12-Lead    Hemoglobin and hematocrit, blood    Basic metabolic panel    2. Long term (current) use of anticoagulants  Z79.01 Hemoglobin and hematocrit, blood    Basic metabolic panel    3. Benign hypertension  I10     4. Mixed hyperlipidemia  E78.2     5. Type 2 diabetes mellitus with other circulatory complication, without long-term current use of insulin (HCC)  E11.59         RECOMMENDATIONS: Alejandro Bowen is a 62 y.o. male whose past medical history and cardiac risk factors include: Hypertension, anxiety, obesity due to excess calories, recent COVID-19 infection, acute hypoxic respiratory  failure due to COVID-19 pneumonia, NIDDM, paroxysmal atrial fibrillation.  Paroxysmal atrial fibrillation (HCC) Rate control: Metoprolol. Rhythm control: N/A. Thromboembolic prophylaxis: Eliquis. Diagnosed in November 2020 CHA2DS2-VASc SCORE is 2 which correlates to 2.2% risk of stroke per year (HTN, diabetes).  No prior history of cardioversion or ablation. ECG today illustrates sinus bradycardia.  Discussed the importance of weight loss, addressing his sleep apnea, and management of comorbid conditions.  Long term  (current) use of anticoagulants Indication: Paroxysmal atrial fibrillation. Does not endorse evidence of bleeding. Check hemoglobin and hematocrit. 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  However he remains reluctant.  Benign hypertension Office blood pressures are well-controlled. Continue current medical therapy. No changes warranted.  Mixed hyperlipidemia Continue rosuvastatin, does not endorse myalgias. Currently managed by primary care provider.  Type 2 diabetes mellitus with other circulatory complication, without long-term current use of insulin (HCC) Reemphasized importance of glycemic control. Has lost weight since last office visit, congratulated on his efforts. Currently managed by primary care provider.   Given his atrial fibrillation, diabetes, I have asked him to discuss the role of Ozempic/Wegovy/Rybelsus with PCP to help facilitate weight loss.  Recommended baseline blood work as a follow-up to evaluate renal function and hemoglobin.  He will have a yearly follow-up visit with PCP in approxi-6 months.  I have asked him to send [place] a copy for reference to make sure renal function and hemoglobin are at baseline since he is on anticoagulation.    FINAL MEDICATION LIST END OF ENCOUNTER: No orders of the defined types were placed in this encounter.   Current Outpatient Medications:    apixaban (ELIQUIS) 5 MG TABS tablet, Take 1 tablet (5 mg total) by mouth 2 (two) times daily., Disp: 60 tablet, Rfl: 0   Ascorbic Acid (VITAMIN C) 1000 MG tablet, Take 1,000 mg by mouth daily., Disp: , Rfl:    blood glucose meter kit and supplies, Dispense based on patient and insurance preference. Use up to four times daily as directed. (FOR ICD-10 E10.9, E11.9)., Disp: 1 each, Rfl: 0   cholecalciferol (VITAMIN D3) 25 MCG (1000 UNIT) tablet, Take 2,000 Units by  mouth daily., Disp: , Rfl:    citalopram (CELEXA) 20 MG tablet, Take 20 mg by mouth daily., Disp: , Rfl:    glipiZIDE (GLUCOTROL XL) 2.5 MG 24 hr tablet, Take 2.5 mg by mouth daily with breakfast., Disp: , Rfl:    glucose blood (FREESTYLE TEST STRIPS) test strip, Use as instructed, Disp: 100 each, Rfl: 0   Insulin Pen Needle 32G X 4 MM MISC, 1 each by Does not apply route as needed., Disp: 200 each, Rfl: 0   Lancets (FREESTYLE) lancets, Use as instructed, Disp: 100 each, Rfl: 0   metoprolol tartrate (LOPRESSOR) 50 MG tablet, TAKE 1 TABLET BY MOUTH TWICE A DAY, Disp: 60 tablet, Rfl: 2   Multiple Vitamins-Minerals (ADULT GUMMY PO), Take 2 capsules by mouth daily., Disp: , Rfl:    rosuvastatin (CRESTOR) 20 MG tablet, TAKE 1 TABLET BY MOUTH EVERYDAY AT BEDTIME, Disp: 30 tablet, Rfl: 2   nitroGLYCERIN (NITROSTAT) 0.4 MG SL tablet, Place 1 tablet (0.4 mg total) under the tongue every 5 (five) minutes as needed for chest pain. If you require more than two tablets five minutes apart go to the nearest ER via EMS., Disp: 30 tablet, Rfl: 0  Orders Placed This  Encounter  Procedures   Hemoglobin and hematocrit, blood   Basic metabolic panel   EKG 12-Lead    There are no Patient Instructions on file for this visit.   --Continue cardiac medications as reconciled in final medication list. --Return in about 6 months (around 02/14/2024) for Follow up, A. fib. Or sooner if needed. --Continue follow-up with your primary care physician regarding the management of your other chronic comorbid conditions.  Patient's questions and concerns were addressed to his satisfaction. He voices understanding of the instructions provided during this encounter.   This note was created using a voice recognition software as a result there may be grammatical errors inadvertently enclosed that do not reflect the nature of this encounter. Every attempt is made to correct such errors.  Tessa Lerner, [place], [facility]  Pager:  [phone] Office: [phone]

## 2023-08-15 LAB — BASIC METABOLIC PANEL
BUN/Creatinine Ratio: 25 — ABNORMAL HIGH (ref 9–20)
BUN: 21 mg/dL (ref 6–24)
CO2: 23 mmol/L (ref 20–29)
Calcium: 9.5 mg/dL (ref 8.7–10.2)
Chloride: 102 mmol/L (ref 96–106)
Creatinine, Ser: 0.85 mg/dL (ref 0.76–1.27)
Glucose: 232 mg/dL — ABNORMAL HIGH (ref 70–99)
Potassium: 4.4 mmol/L (ref 3.5–5.2)
Sodium: 136 mmol/L (ref 134–144)
eGFR: 100 mL/min/{1.73_m2} (ref >59)

## 2023-08-15 LAB — HEMOGLOBIN AND HEMATOCRIT, BLOOD
Hematocrit: 39.5 % (ref 37.5–51.0)
Hemoglobin: 13.7 g/dL (ref 13.0–17.7)

## 2023-08-29 ENCOUNTER — Other Ambulatory Visit: Payer: Self-pay | Admitting: Cardiology

## 2023-08-29 DIAGNOSIS — I480 Paroxysmal atrial fibrillation: Secondary | ICD-10-CM

## 2023-10-11 ENCOUNTER — Other Ambulatory Visit: Payer: Self-pay | Admitting: Cardiology

## 2023-10-11 DIAGNOSIS — E1159 Type 2 diabetes mellitus with other circulatory complications: Secondary | ICD-10-CM

## 2023-12-11 ENCOUNTER — Other Ambulatory Visit: Payer: Self-pay | Admitting: Cardiology

## 2023-12-11 DIAGNOSIS — I480 Paroxysmal atrial fibrillation: Secondary | ICD-10-CM

## 2024-02-16 ENCOUNTER — Ambulatory Visit: Payer: Self-pay | Admitting: Cardiology

## 2024-02-23 ENCOUNTER — Ambulatory Visit: Payer: No Typology Code available for payment source | Attending: Cardiology | Admitting: Cardiology

## 2024-02-23 ENCOUNTER — Encounter: Payer: Self-pay | Admitting: Cardiology

## 2024-02-23 VITALS — BP 120/80 | HR 52 | Resp 16 | Ht 78.0 in | Wt 252.2 lb

## 2024-02-23 DIAGNOSIS — E782 Mixed hyperlipidemia: Secondary | ICD-10-CM | POA: Diagnosis not present

## 2024-02-23 DIAGNOSIS — Z7901 Long term (current) use of anticoagulants: Secondary | ICD-10-CM | POA: Diagnosis not present

## 2024-02-23 DIAGNOSIS — I10 Essential (primary) hypertension: Secondary | ICD-10-CM | POA: Diagnosis not present

## 2024-02-23 DIAGNOSIS — E1159 Type 2 diabetes mellitus with other circulatory complications: Secondary | ICD-10-CM

## 2024-02-23 DIAGNOSIS — I480 Paroxysmal atrial fibrillation: Secondary | ICD-10-CM

## 2024-02-23 NOTE — Patient Instructions (Signed)
 Medication Instructions:  Your physician recommends that you continue on your current medications as directed. Please refer to the Current Medication list given to you today.  *If you need a refill on your cardiac medications before your next appointment, please call your pharmacy*  Lab Work: To be completed prior to your June appointment with Dr. Odis Hollingshead:  BMP and hemoglobin/ hematocrit   If you have labs (blood work) drawn today and your tests are completely normal, you will receive your results only by: MyChart Message (if you have MyChart) OR A paper copy in the mail If you have any lab test that is abnormal or we need to change your treatment, we will call you to review the results.  Testing/Procedures: None ordered today.  Follow-Up: At [facility], you and your health needs are our priority.  As part of our continuing mission to provide you with exceptional heart care, we have created designated Provider Care Teams.  These Care Teams include your primary Cardiologist (physician) and Advanced Practice Providers (APPs -  Physician Assistants and Nurse Practitioners) who all work together to provide you with the care you need, when you need it.   Your next appointment:   4 month(s) (June 2025)  The format for your next appointment:   In Person  Provider:   Tessa Lerner, DO {

## 2024-02-23 NOTE — Progress Notes (Signed)
 Cardiology Office Note:  .   Date:  02/23/2024  ID:  Alejandro Bowen, DOB [birth year], MRN [id number] PCP:  [facility]  Former Cardiology Providers: N/A [facility] Providers Cardiologist:  Tessa Lerner, DO , [facility] (established care 02/23/24) Electrophysiologist:  None  Click to update primary MD,subspecialty MD or APP then REFRESH:1}    Chief Complaint  Patient presents with   Paroxysmal atrial fibrillation   Follow-up    History of Present Illness: .   Alejandro Bowen is a 63 y.o. Caucasian male whose past medical history and cardiovascular risk factors includes: Hypertension, non-insulin-dependent diabetes mellitus type 2, hyperlipidemia anxiety, obesity due to excess calories, recent COVID-19 infection, acute hypoxic respiratory failure due to COVID-19 pneumonia, paroxysmal atrial fibrillation.   Is being followed by the practice given his history of paroxysmal atrial fibrillation.  Patient remains on metoprolol for rate control strategy and Eliquis for thromboembolic prophylaxis.   Since last office visit, doing well from a cardiovascular standpoint.  Denies anginal chest pain or heart failure symptoms.  With lifestyle changes he has lost 13 pounds for which he is congratulated for today's office visit.  He was diagnosed with mild sleep apnea but chooses not to be on device therapy.  Patient does not endorse any evidence of bleeding.  Review of Systems: .   Review of Systems  Constitutional: Positive for weight loss (13#).  Cardiovascular:  Negative for chest pain, claudication, irregular heartbeat, leg swelling, near-syncope, orthopnea, palpitations, paroxysmal nocturnal dyspnea and syncope.  Respiratory:  Negative for shortness of breath.   Hematologic/Lymphatic: Negative for bleeding problem.    Studies Reviewed:   EKG: EKG Interpretation Date/Time:  Friday February 23 2024 08:30:24 EST Ventricular Rate:  52 PR Interval:  196 QRS  Duration:  88 QT Interval:  430 QTC Calculation: 399 R Axis:   47  Text Interpretation: Sinus bradycardia When compared with ECG of 30-Dec-2020 12:16, No significant change was found Confirmed by Tessa Lerner [id number] on 02/23/2024 8:37:22 AM  Echocardiogram: 12/01/2020: LVEF 60-65%, no regional wall motion abnormalities, no significant valvular abnormality.   Stress Testing: Exercise Sestamibi stress test 04/05/2021: Exercise nuclear stress test was performed using Bruce protocol. Patient reached 7 METS, and 87% of age predicted maximum heart rate. Exercise capacity was low. No chest pain reported. Heart rate and hemodynamic response were normal. Stress EKG revealed no ischemic changes. Normal stress myocardial perfusion. Stress LVEF 52%. Low risk study.   Heart Catheterization: None   Coronary calcium score 06/04/2021: 1. Coronary calcium score of 0. 2. Resolution of acute COVID-19 pneumonia related infiltrates with prominent residual bandlike areas of peripheral scarring in both lungs. There may be some mild associated bronchiectasis due to traction in the lower lobes bilaterally. No overt fibrosis is identified. Correlation suggested with any chronic respiratory symptoms following prior COVID pneumonia.   CCTA:  02/02/2022 1. Total coronary calcium score of 0.   2. Normal coronary origin with left dominance.   3. CAD-RADS = 0. No significant epicardial coronary artery disease. Due to artifact the accuracy of the proximal non-dominant RCA, distal and apical LAD is reduced.   Noncardiac findings:  1.  No acute findings in the imaged extracardiac chest. 2. 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 3.  Aortic Atherosclerosis (ICD10-I70.0). 4. Mild hepatic steatosis.  RADIOLOGY: NA  Risk Assessment/Calculations:   Click Here to Calculate/Change CHADS2VASc  Score The patient's CHADS2-VASc score  is 2, indicating a 2.2% annual risk of stroke.   CHF History: No HTN History: Yes Diabetes History: Yes Stroke History: No Vascular Disease History: No   Labs:       Latest Ref Rng & Units 08/14/2023   11:07 AM 12/24/2020    1:20 PM 12/09/2020    1:21 AM  CBC  WBC 3.4 - 10.8 x10E3/uL  5.5  20.7   Hemoglobin 13.0 - 17.7 g/dL 16.1  09.6  04.5   Hematocrit 37.5 - 51.0 % 39.5  41.1  48.0   Platelets 150 - 450 x10E3/uL  221  243        Latest Ref Rng & Units 08/14/2023   11:07 AM 04/02/2021   12:38 PM 12/24/2020    1:20 PM  BMP  Glucose 70 - 99 mg/dL 409  811  914   BUN 6 - 24 mg/dL 21  16  17    Creatinine 0.76 - 1.27 mg/dL 7.82  9.56  2.13   BUN/Creat Ratio 9 - 20 25  17  19    Sodium 134 - 144 mmol/L 136  141  142   Potassium 3.5 - 5.2 mmol/L 4.4  4.7  4.4   Chloride 96 - 106 mmol/L 102  103  105   CO2 20 - 29 mmol/L 23  22  21    Calcium 8.7 - 10.2 mg/dL 9.5  9.9  9.7       Latest Ref Rng & Units 08/14/2023   11:07 AM 04/02/2021   12:38 PM 12/24/2020    1:20 PM  CMP  Glucose 70 - 99 mg/dL 086  578  469   BUN 6 - 24 mg/dL 21  16  17    Creatinine 0.76 - 1.27 mg/dL 6.29  5.28  4.13   Sodium 134 - 144 mmol/L 136  141  142   Potassium 3.5 - 5.2 mmol/L 4.4  4.7  4.4   Chloride 96 - 106 mmol/L 102  103  105   CO2 20 - 29 mmol/L 23  22  21    Calcium 8.7 - 10.2 mg/dL 9.5  9.9  9.7   Total Protein 6.0 - 8.5 g/dL  6.9  6.4   Total Bilirubin 0.0 - 1.2 mg/dL  0.3  0.3   Alkaline Phos 44 - 121 IU/L  95  87   AST 0 - 40 IU/L  50  55   ALT 0 - 44 IU/L  76  101    Lipid profile: 08/11/2020 Total cholesterol 189, triglycerides 142, HDL 45, LDL 113, non-HDL 144. 01/20/2023 Total cholesterol 125, triglycerides 134, HDL 48, LDL direct 66, non-HDL 77  11/29/2023 [facility]: Total cholesterol 113, triglycerides 138, HDL 40, LDL calculated 51, non-HDL 73. Hemoglobin A1c 9. Hemoglobin 14.7, hematocrit 43.9% Sodium 136, potassium 4.8, chloride 100,  bicarb 27  Physical Exam:    Today's Vitals   02/23/24 0827  BP: 120/80  Pulse: (!) 52  Resp: 16  SpO2: 97%  Weight: 252 lb 3.2 oz (114.4 kg)  Height: 6\' 6"  (1.981 m)   Body mass index is 29.14 kg/m. Wt Readings from Last 3 Encounters:  02/23/24 252 lb 3.2 oz (114.4 kg)  08/14/23 268 lb (121.6 kg)  02/10/23 270 lb (122.5 kg)    Physical Exam  Constitutional: No distress.  Age appropriate, hemodynamically stable.   Neck: No JVD present.  Cardiovascular: Regular rhythm, S1 normal, S2 normal, intact distal pulses and normal pulses. Bradycardia present. Exam reveals no gallop, no  S3 and no S4.  No murmur heard. Pulmonary/Chest: Effort normal and breath sounds normal. No stridor. He has no wheezes. He has no rales.  Abdominal: Soft. Bowel sounds are normal. He exhibits no distension. There is no abdominal tenderness.  Musculoskeletal:        General: No edema.     Cervical back: Neck supple.  Neurological: He is alert and oriented to person, place, and time. He has intact cranial nerves (2-12).  Skin: Skin is warm and moist.     Impression & Recommendation(s):  Impression:   ICD-10-CM   1. Paroxysmal atrial fibrillation (HCC)  I48.0 EKG 12-Lead    Basic metabolic panel    Hemoglobin and hematocrit, blood    Hemoglobin and hematocrit, blood    Basic metabolic panel    2. Long term (current) use of anticoagulants  Z79.01 Basic metabolic panel    Hemoglobin and hematocrit, blood    Hemoglobin and hematocrit, blood    Basic metabolic panel    3. Benign hypertension  I10     4. Mixed hyperlipidemia  E78.2     5. Type 2 diabetes mellitus with other circulatory complication, without long-term current use of insulin (HCC)  E11.59        Recommendation(s):  Paroxysmal atrial fibrillation (HCC) Long term (current) use of anticoagulants Rate control: Lopressor 50 mg p.o. twice daily. Thromboembolic prophylaxis: Eliquis 5 mg p.o. twice daily. EKG illustrates sinus  bradycardia without underlying ischemia or injury pattern. Does not endorse any evidence of bleeding Outside labs from December 2024 independently reviewed and noted above for further reference. Will check H&H and BMP prior to the next office visit in June 2025  Benign hypertension Office blood pressures are very well-controlled. Continue metoprolol titrate as discussed above. Has lost 13 pounds at the last office visit which has also significantly helped. Reemphasized importance of low-salt diet.  Mixed hyperlipidemia Currently on Crestor 20 mg p.o. daily.   He denies myalgia or other side effects. Most recent lipids dated November 29, 2023 [facility] Everywhere, independently reviewed as noted above.  LDL is 51 mg/dL.  Cardiology is following peripherally.   Type 2 diabetes mellitus with other circulatory complication, without long-term current use of insulin (HCC) Hemoglobin A1c still not well-controlled, as of December 2024 it is 9.0. Has an upcoming appointment with PCP to have it rechecked. Consider low-dose ARB for renal protection-will defer to PCP Continue statin therapy-LDL at goal.  Patient overall is doing well from a cardiovascular standpoint.  He has his yearly physical in December at which point he will have his H&H and BMP checked with PCP.  And I will see him once a year in June at that time I will also check his H&H and BMP to make sure there is no signs for bleeding and renal function remained stable as he is on anticoagulation.  This way he will be seeing cardiology on a once a year basis but will still have his labs checked every 6 months.  Patient and wife are agreeable with the plan of care.  Orders Placed:  Orders Placed This Encounter  Procedures   Basic metabolic panel    Standing Status:   Future    Number of Occurrences:   1    Expected Date:   05/22/2024    Expiration Date:   02/22/2025   Hemoglobin and hematocrit, blood    Standing Status:    Future    Number of Occurrences:  1    Expected Date:   05/22/2024    Expiration Date:   02/22/2025   EKG 12-Lead    Final Medication List:   No orders of the defined types were placed in this encounter.   There are no discontinued medications.   Current Outpatient Medications:    apixaban (ELIQUIS) 5 MG TABS tablet, Take 1 tablet (5 mg total) by mouth 2 (two) times daily., Disp: 60 tablet, Rfl: 0   Ascorbic Acid (VITAMIN C) 1000 MG tablet, Take 1,000 mg by mouth daily., Disp: , Rfl:    blood glucose meter kit and supplies, Dispense based on patient and insurance preference. Use up to four times daily as directed. (FOR ICD-10 E10.9, E11.9)., Disp: 1 each, Rfl: 0   cholecalciferol (VITAMIN D3) 25 MCG (1000 UNIT) tablet, Take 2,000 Units by mouth daily., Disp: , Rfl:    citalopram (CELEXA) 20 MG tablet, Take 20 mg by mouth daily., Disp: , Rfl:    glipiZIDE (GLUCOTROL XL) 2.5 MG 24 hr tablet, Take 2.5 mg by mouth daily with breakfast., Disp: , Rfl:    glucose blood (FREESTYLE TEST STRIPS) test strip, Use as instructed, Disp: 100 each, Rfl: 0   Insulin Pen Needle 32G X 4 MM MISC, 1 each by Does not apply route as needed., Disp: 200 each, Rfl: 0   Lancets (FREESTYLE) lancets, Use as instructed, Disp: 100 each, Rfl: 0   metoprolol tartrate (LOPRESSOR) 50 MG tablet, TAKE 1 TABLET BY MOUTH TWICE A DAY, Disp: 60 tablet, Rfl: 7   Multiple Vitamins-Minerals (ADULT GUMMY PO), Take 2 capsules by mouth daily., Disp: , Rfl:    rosuvastatin (CRESTOR) 20 MG tablet, Take 1 tablet (20 mg total) by mouth daily., Disp: 90 tablet, Rfl: 3   nitroGLYCERIN (NITROSTAT) 0.4 MG SL tablet, Place 1 tablet (0.4 mg total) under the tongue every 5 (five) minutes as needed for chest pain. If you require more than two tablets five minutes apart go to the nearest ER via EMS., Disp: 30 tablet, Rfl: 0  Consent:   NA  Disposition:   4 months sooner if needed.  His questions and concerns were addressed to his satisfaction.  He voices understanding of the recommendations provided during this encounter.    Signed, Tessa Lerner, DO, [facility]  [facility]  [address] [place], [place] [address] 02/23/2024 11:22 AM

## 2024-04-28 ENCOUNTER — Other Ambulatory Visit: Payer: Self-pay

## 2024-04-28 ENCOUNTER — Emergency Department (HOSPITAL_BASED_OUTPATIENT_CLINIC_OR_DEPARTMENT_OTHER)
Admission: EM | Admit: 2024-04-28 | Discharge: 2024-04-28 | Disposition: A | Discharge status: Home / Self Care | Attending: Emergency Medicine | Admitting: Emergency Medicine

## 2024-04-28 ENCOUNTER — Emergency Department (HOSPITAL_BASED_OUTPATIENT_CLINIC_OR_DEPARTMENT_OTHER)

## 2024-04-28 ENCOUNTER — Encounter (HOSPITAL_BASED_OUTPATIENT_CLINIC_OR_DEPARTMENT_OTHER): Payer: Self-pay | Admitting: *Deleted

## 2024-04-28 DIAGNOSIS — E119 Type 2 diabetes mellitus without complications: Secondary | ICD-10-CM | POA: Insufficient documentation

## 2024-04-28 DIAGNOSIS — J189 Pneumonia, unspecified organism: Secondary | ICD-10-CM | POA: Insufficient documentation

## 2024-04-28 DIAGNOSIS — Z7984 Long term (current) use of oral hypoglycemic drugs: Secondary | ICD-10-CM | POA: Diagnosis not present

## 2024-04-28 DIAGNOSIS — I10 Essential (primary) hypertension: Secondary | ICD-10-CM | POA: Insufficient documentation

## 2024-04-28 DIAGNOSIS — Z794 Long term (current) use of insulin: Secondary | ICD-10-CM | POA: Insufficient documentation

## 2024-04-28 DIAGNOSIS — R059 Cough, unspecified: Secondary | ICD-10-CM | POA: Diagnosis present

## 2024-04-28 DIAGNOSIS — Z7901 Long term (current) use of anticoagulants: Secondary | ICD-10-CM | POA: Diagnosis not present

## 2024-04-28 LAB — CBC
HCT: 44.3 % (ref 39.0–52.0)
Hemoglobin: 15.1 g/dL (ref 13.0–17.0)
MCH: 29.8 pg (ref 26.0–34.0)
MCHC: 34.1 g/dL (ref 30.0–36.0)
MCV: 87.4 fL (ref 80.0–100.0)
Platelets: 186 10*3/uL (ref 150–400)
RBC: 5.07 MIL/uL (ref 4.22–5.81)
RDW: 14 % (ref 11.5–15.5)
WBC: 6.6 10*3/uL (ref 4.0–10.5)
nRBC: 0 % (ref 0.0–0.2)

## 2024-04-28 LAB — BASIC METABOLIC PANEL WITH GFR
Anion gap: 17 — ABNORMAL HIGH (ref 5–15)
BUN: 22 mg/dL — ABNORMAL HIGH (ref 6–20)
CO2: 20 mmol/L — ABNORMAL LOW (ref 22–32)
Calcium: 10.4 mg/dL — ABNORMAL HIGH (ref 8.9–10.3)
Chloride: 100 mmol/L (ref 98–111)
Creatinine, Ser: 0.97 mg/dL (ref 0.61–1.24)
GFR, Estimated: >60 mL/min (ref >60)
Glucose, Bld: 149 mg/dL — ABNORMAL HIGH (ref 70–99)
Potassium: 4.2 mmol/L (ref 3.5–5.1)
Sodium: 137 mmol/L (ref 135–145)

## 2024-04-28 LAB — TROPONIN T, HIGH SENSITIVITY: Troponin T High Sensitivity: <15 ng/L (ref <19)

## 2024-04-28 MED ORDER — ACETAMINOPHEN 500 MG PO TABS
1000.0000 mg | ORAL_TABLET | Freq: Once | ORAL | Status: AC
  Administered 2024-04-28: 1000 mg via ORAL
  Filled 2024-04-28: qty 2

## 2024-04-28 MED ORDER — DOXYCYCLINE HYCLATE 100 MG PO TABS
100.0000 mg | ORAL_TABLET | Freq: Once | ORAL | Status: AC
  Administered 2024-04-28: 100 mg via ORAL
  Filled 2024-04-28: qty 1

## 2024-04-28 MED ORDER — LEVOFLOXACIN 750 MG PO TABS: 750.0000 mg | ORAL_TABLET | Freq: Once | ORAL | Status: DC

## 2024-04-28 MED ORDER — DOXYCYCLINE HYCLATE 100 MG PO CAPS: 100.0000 mg | ORAL_CAPSULE | Freq: Two times a day (BID) | ORAL | 0 refills | Status: AC

## 2024-04-28 NOTE — ED Triage Notes (Signed)
 Pt has had a cough since Wednesday night which has gotten worse.  No SOB.  Pt has temp 102.6 F orally in triage.

## 2024-04-28 NOTE — Discharge Instructions (Addendum)
 Thank for letting us  evaluate you today.  Your lab work is all unremarkable.  Your heart enzyme/troponin was negative so I do not think any heart issues are occurring.  Your EKG was normal.  Your chest x-ray showed pneumonia in the left lower lobe which we provided you with a dose of antibiotics here in the emergency department as well as a prescription for the next 5 days  Please return to Emergency Department if you experience significant worsening symptoms otherwise please follow-up with PCP within the next 7-10 days for repeat x-ray

## 2024-04-28 NOTE — ED Provider Notes (Signed)
 [place] [facility] AT [facility] Provider Note   CSN: [id number] Arrival date & time: 04/28/24  1659     History {Add pertinent medical, surgical, social history, OB history to HPI:1} Chief Complaint  Patient presents with   Cough    Alejandro Bowen is a 63 y.o. male with past medical history of A-fib, HTN, T2DM presents emergency department for evaluation of sore throat, productive cough, pleuritic chest pain following coughing that have been since Thursday, 3 days prior. Denies VD  Was originally evaluated at an urgent care in [facility].  They recommended ED evaluation as he started having nausea and felt faint while laying down on exam table.  He contributes this as he has not eaten his normal amount for lunch and had not eaten for a significant amount of time.  Symptoms improved following obtaining a lollipop from staff.  CBG at that time was 140.  No syncope. No other symptoms of nausea, faintness since then   Cough    Home Medications Prior to Admission medications   Medication Sig Start Date End Date Taking? Authorizing Provider  apixaban  (ELIQUIS ) 5 MG TABS tablet Take 1 tablet (5 mg total) by mouth 2 (two) times daily. 12/09/20   Ghimire, Estil Heman, MD  Ascorbic Acid  (VITAMIN C) 1000 MG tablet Take 1,000 mg by mouth daily.    [provider]  blood glucose meter kit and supplies Dispense based on patient and insurance preference. Use up to four times daily as directed. (FOR ICD-10 E10.9, E11.9). 12/09/20   Ghimire, Estil Heman, MD  cholecalciferol  (VITAMIN D3) 25 MCG (1000 UNIT) tablet Take 2,000 Units by mouth daily.    [provider]  citalopram  (CELEXA ) 20 MG tablet Take 20 mg by mouth daily. 01/23/23   [provider]  glipiZIDE (GLUCOTROL XL) 2.5 MG 24 hr tablet Take 2.5 mg by mouth daily with breakfast. 01/24/23   [provider]  glucose blood (FREESTYLE TEST STRIPS) test strip Use as instructed 12/09/20   Ghimire,  Estil Heman, MD  Insulin  Pen Needle 32G X 4 MM MISC 1 each by Does not apply route as needed. 12/09/20   Ghimire, Estil Heman, MD  Lancets (FREESTYLE) lancets Use as instructed 12/09/20   Ghimire, Estil Heman, MD  metoprolol  tartrate (LOPRESSOR ) 50 MG tablet TAKE 1 TABLET BY MOUTH TWICE A DAY 12/12/23   Tolia, Sunit, DO  Multiple Vitamins-Minerals (ADULT GUMMY PO) Take 2 capsules by mouth daily.    [provider]  nitroGLYCERIN  (NITROSTAT ) 0.4 MG SL tablet Place 1 tablet (0.4 mg total) under the tongue every 5 (five) minutes as needed for chest pain. If you require more than two tablets five minutes apart go to the nearest ER via EMS. 01/21/22 02/10/23  Tolia, Sunit, DO  rosuvastatin  (CRESTOR ) 20 MG tablet Take 1 tablet (20 mg total) by mouth daily. 10/11/23   Tolia, Sunit, DO      Allergies    Penicillins and Alprazolam     Review of Systems   Review of Systems  Respiratory:  Positive for cough.     Physical Exam Updated Vital Signs BP (!) 149/83   Pulse 76   Temp (!) 102.6 F (39.2 C) (Oral)   Resp 18   SpO2 94%  Physical Exam  ED Results / Procedures / Treatments   Labs (all labs ordered are listed, but only abnormal results are displayed) Labs Reviewed  BASIC METABOLIC PANEL WITH GFR - Abnormal; Notable for the following components:  Result Value   CO2 20 (*)    Glucose, Bld 149 (*)    BUN 22 (*)    Calcium  10.4 (*)    Anion gap 17 (*)    All other components within normal limits  CBC    EKG None  Radiology DG Chest 2 View Result Date: 04/28/2024 CLINICAL DATA:  cough, fever EXAM: CHEST - 2 VIEW COMPARISON:  11/27/2020 FINDINGS: Mild interstitial opacities at the left lung base with decrease in the patchy airspace disease seen previously left greater than right. Heart size and mediastinal contours are within normal limits. No effusion. Visualized bones unremarkable. IMPRESSION: Mild left basilar interstitial opacities. Electronically Signed   By: Nicoletta Barrier M.D.    On: 04/28/2024 17:40    Procedures Procedures  {Document cardiac monitor, telemetry assessment procedure when appropriate:1}  Medications Ordered in ED Medications  acetaminophen  (TYLENOL ) tablet 1,000 mg (1,000 mg Oral Given 04/28/24 1720)    ED Course/ Medical Decision Making/ A&P   {   Click here for ABCD2, HEART and other calculatorsREFRESH Note before signing :1}                              Medical Decision Making Amount and/or Complexity of Data Reviewed Labs: ordered. Radiology: ordered.  Risk OTC drugs.   ***  {Document critical care time when appropriate:1} {Document review of labs and clinical decision tools ie heart score, Chads2Vasc2 etc:1}  {Document your independent review of radiology images, and any outside records:1} {Document your discussion with family members, caretakers, and with consultants:1} {Document social determinants of health affecting pt's care:1} {Document your decision making why or why not admission, treatments were needed:1} Final Clinical Impression(s) / ED Diagnoses Final diagnoses:  None    Rx / DC Orders ED Discharge Orders     None

## 2024-06-21 ENCOUNTER — Ambulatory Visit: Payer: No Typology Code available for payment source | Admitting: Cardiology

## 2024-07-26 ENCOUNTER — Ambulatory Visit: Attending: Cardiology | Admitting: Cardiology

## 2024-07-26 ENCOUNTER — Encounter: Payer: Self-pay | Admitting: Cardiology

## 2024-07-26 VITALS — BP 145/88 | HR 53 | Resp 16 | Ht 78.0 in | Wt 258.6 lb

## 2024-07-26 DIAGNOSIS — Z7901 Long term (current) use of anticoagulants: Secondary | ICD-10-CM

## 2024-07-26 DIAGNOSIS — I480 Paroxysmal atrial fibrillation: Secondary | ICD-10-CM

## 2024-07-26 DIAGNOSIS — I10 Essential (primary) hypertension: Secondary | ICD-10-CM

## 2024-07-26 DIAGNOSIS — E782 Mixed hyperlipidemia: Secondary | ICD-10-CM

## 2024-07-26 DIAGNOSIS — E1159 Type 2 diabetes mellitus with other circulatory complications: Secondary | ICD-10-CM

## 2024-07-26 MED ORDER — METOPROLOL SUCCINATE ER 50 MG PO TB24: 50.0000 mg | ORAL_TABLET | Freq: Every day | ORAL | 3 refills | Status: AC

## 2024-07-26 MED ORDER — APIXABAN 5 MG PO TABS: 5.0000 mg | ORAL_TABLET | Freq: Two times a day (BID) | ORAL | 3 refills | Status: AC

## 2024-07-26 NOTE — Patient Instructions (Signed)
 Medication Instructions:  STOP Metoprolol  Tartrate (Lopressor )  START Metoprolol  Succinate (Toprol -XL) 50 mg once daily in the morning HOLD if systolic blood pressure (top number) is less than 100 and/ or heart rate is less than 55.   Refill for Eliquis  has been sent to your pharmacy.  *If you need a refill on your cardiac medications before your next appointment, please call your pharmacy*  Lab Work: To be completed prior to follow-up appointment in June 2026: - BMP - Hemoglobin/ Hematocrit   If you have labs (blood work) drawn today and your tests are completely normal, you will receive your results only by: MyChart Message (if you have MyChart) OR A paper copy in the mail If you have any lab test that is abnormal or we need to change your treatment, we will call you to review the results.  Testing/Procedures: None ordered today.  Follow-Up: At [facility], you and your health needs are our priority.  As part of our continuing mission to provide you with exceptional heart care, our providers are all part of one team.  This team includes your primary Cardiologist (physician) and Advanced Practice Providers or APPs (Physician Assistants and Nurse Practitioners) who all work together to provide you with the care you need, when you need it.  Your next appointment:   June 2026  Provider:   Madonna Large, DO    We recommend signing up for the patient portal called MyChart.  Sign up information is provided on this After Visit Summary.  MyChart is used to connect with patients for Virtual Visits (Telemedicine).  Patients are able to view lab/test results, encounter notes, upcoming appointments, etc.  Non-urgent messages can be sent to your provider as well.   To learn more about what you can do with MyChart, go to [email or web address].   Other Instructions Continue to check blood pressure at home and follow-up with primary care provider.

## 2024-07-26 NOTE — Progress Notes (Signed)
 Cardiology Office Note:  .   Date:  07/26/2024  ID:  Alejandro Bowen, DOB 1961-02-22, MRN [id number] PCP:  [facility]  Former Cardiology Providers: N/A [facility] Providers Cardiologist:  Madonna Large, DO , [facility] (established care 07/26/24) Electrophysiologist:  None  Click to update primary MD,subspecialty MD or APP then REFRESH:1}    Chief Complaint  Patient presents with   Atrial Fibrillation   Follow-up    History of Present Illness: .   Alejandro Bowen is a 63 y.o. Caucasian male whose past medical history and cardiovascular risk factors includes: Hypertension, non-insulin -dependent diabetes mellitus type 2, hyperlipidemia anxiety, obesity due to excess calories, recent COVID-19 infection, acute hypoxic respiratory failure due to COVID-19 pneumonia, paroxysmal atrial fibrillation.   Patient has history of paroxysmal atrial fibrillation and remains on metoprolol  for rate control strategy and anticoagulation for thromboembolic prophylaxis.  He presents today for 61-month follow-up visit.  Since last office visit patient denies any anginal chest pain or heart failure symptoms.  No hospitalizations or urgent care visits for cardiovascular reasons.  He has been compliant with his medical therapy.  Eight pounds of weight gain since last office visit. Physical endurance remains stable . Home SBP ranges between 130-14mmHg.   Review of Systems: .   Review of Systems  Constitutional: Negative for weight loss.  Cardiovascular:  Negative for chest pain, claudication, irregular heartbeat, leg swelling, near-syncope, orthopnea, palpitations, paroxysmal nocturnal dyspnea and syncope.  Respiratory:  Negative for shortness of breath.   Hematologic/Lymphatic: Negative for bleeding problem.    Studies Reviewed:   EKG: EKG Interpretation Date/Time:  Friday July 26 2024 10:24:48 EDT Ventricular Rate:  49 PR Interval:  216 QRS Duration:  90 QT  Interval:  434 QTC Calculation: 392 R Axis:   59  Text Interpretation: Sinus bradycardia with 1st degree A-V block When compared with ECG of 23-Feb-2024 08:30, No significant change was found Confirmed by Large Madonna [phone]) on 07/26/2024 10:28:48 AM  Echocardiogram: 12/01/2020: LVEF 60-65%, no regional wall motion abnormalities, no significant valvular abnormality.   Stress Testing: Exercise Sestamibi stress test 04/05/2021: Exercise nuclear stress test was performed using Bruce protocol. Patient reached 7 METS, and 87% of age predicted maximum heart rate. Exercise capacity was low. No chest pain reported. Heart rate and hemodynamic response were normal. Stress EKG revealed no ischemic changes. Normal stress myocardial perfusion. Stress LVEF 52%. Low risk study.   Heart Catheterization: None   Coronary calcium  score 06/04/2021: 1. Coronary calcium  score of 0. 2. Resolution of acute COVID-19 pneumonia related infiltrates with prominent residual bandlike areas of peripheral scarring in both lungs. There may be some mild associated bronchiectasis due to traction in the lower lobes bilaterally. No overt fibrosis is identified. Correlation suggested with any chronic respiratory symptoms following prior COVID pneumonia.   CCTA:  02/02/2022 1. Total coronary calcium  score of 0.   2. Normal coronary origin with left dominance.   3. CAD-RADS = 0. No significant epicardial coronary artery disease. Due to artifact the accuracy of the proximal non-dominant RCA, distal and apical LAD is reduced.   Noncardiac findings:  1.  No acute findings in the imaged extracardiac chest. 2. 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 3.  Aortic Atherosclerosis (ICD10-I70.0). 4. Mild hepatic steatosis.  RADIOLOGY: NA  Risk Assessment/Calculations:   Click Here to Calculate/Change  CHADS2VASc Score The patient's CHADS2-VASc score is 2, indicating a 2.2%  annual risk of stroke.   CHF History: No HTN History: Yes Diabetes History: Yes Stroke History: No Vascular Disease History: No   Labs:       Latest Ref Rng & Units 04/28/2024    5:37 PM 08/14/2023   11:07 AM 12/24/2020    1:20 PM  CBC  WBC 4.0 - 10.5 K/uL 6.6   5.5   Hemoglobin 13.0 - 17.0 g/dL 84.8  86.2  86.0   Hematocrit 39.0 - 52.0 % 44.3  39.5  41.1   Platelets 150 - 400 K/uL 186   221        Latest Ref Rng & Units 04/28/2024    5:37 PM 08/14/2023   11:07 AM 04/02/2021   12:38 PM  BMP  Glucose 70 - 99 mg/dL 850  767  873   BUN 6 - 20 mg/dL 22  21  16    Creatinine 0.61 - 1.24 mg/dL 9.02  9.14  9.06   BUN/Creat Ratio 9 - 20  25  17    Sodium 135 - 145 mmol/L 137  136  141   Potassium 3.5 - 5.1 mmol/L 4.2  4.4  4.7   Chloride 98 - 111 mmol/L 100  102  103   CO2 22 - 32 mmol/L 20  23  22    Calcium  8.9 - 10.3 mg/dL 89.5  9.5  9.9       Latest Ref Rng & Units 04/28/2024    5:37 PM 08/14/2023   11:07 AM 04/02/2021   12:38 PM  CMP  Glucose 70 - 99 mg/dL 850  767  873   BUN 6 - 20 mg/dL 22  21  16    Creatinine 0.61 - 1.24 mg/dL 9.02  9.14  9.06   Sodium 135 - 145 mmol/L 137  136  141   Potassium 3.5 - 5.1 mmol/L 4.2  4.4  4.7   Chloride 98 - 111 mmol/L 100  102  103   CO2 22 - 32 mmol/L 20  23  22    Calcium  8.9 - 10.3 mg/dL 89.5  9.5  9.9   Total Protein 6.0 - 8.5 g/dL   6.9   Total Bilirubin 0.0 - 1.2 mg/dL   0.3   Alkaline Phos 44 - 121 IU/L   95   AST 0 - 40 IU/L   50   ALT 0 - 44 IU/L   76    Lipid profile: 08/11/2020 Total cholesterol 189, triglycerides 142, HDL 45, LDL 113, non-HDL 144. 01/20/2023 Total cholesterol 125, triglycerides 134, HDL 48, LDL direct 66, non-HDL 77  11/29/2023 [facility]: Total cholesterol 113, triglycerides 138, HDL 40, LDL calculated 51, non-HDL 73. Hemoglobin A1c 9. Hemoglobin 14.7, hematocrit 43.9% Sodium 136, potassium 4.8, chloride 100, bicarb 27  Physical  Exam:    Today's Vitals   07/26/24 0959  BP: (!) 145/88  Pulse: (!) 53  Resp: 16  SpO2: 98%  Weight: 258 lb 9.6 oz (117.3 kg)  Height: 6' 6 (1.981 m)   Body mass index is 29.88 kg/m. Wt Readings from Last 3 Encounters:  07/26/24 258 lb 9.6 oz (117.3 kg)  02/23/24 252 lb 3.2 oz (114.4 kg)  08/14/23 268 lb (121.6 kg)    Physical Exam  Constitutional: No distress.  Age appropriate, hemodynamically stable.   Neck: No JVD present.  Cardiovascular: Regular rhythm, S1 normal, S2 normal, intact distal pulses and normal pulses. Bradycardia present. Exam reveals no gallop, no S3 and no S4.  No murmur heard.  Pulmonary/Chest: Effort normal and breath sounds normal. No stridor. He has no wheezes. He has no rales.  Abdominal: Soft. Bowel sounds are normal. He exhibits no distension. There is no abdominal tenderness.  Musculoskeletal:        General: No edema.     Cervical back: Neck supple.  Neurological: He is alert and oriented to person, place, and time. He has intact cranial nerves (2-12).  Skin: Skin is warm and moist.     Impression & Recommendation(s):  Impression:   ICD-10-CM   1. Paroxysmal atrial fibrillation (HCC)  I48.0 EKG 12-Lead    metoprolol  succinate (TOPROL -XL) 50 MG 24 hr tablet    apixaban  (ELIQUIS ) 5 MG TABS tablet    Hemoglobin and hematocrit, blood    Hemoglobin and hematocrit, blood    2. Long term (current) use of anticoagulants  Z79.01 Hemoglobin and hematocrit, blood    Hemoglobin and hematocrit, blood    3. Benign hypertension  I10 Basic metabolic panel with GFR    Basic metabolic panel with GFR    4. Mixed hyperlipidemia  E78.2     5. Type 2 diabetes mellitus with other circulatory complication, without long-term current use of insulin  (HCC)  E11.59        Recommendation(s):  Paroxysmal atrial fibrillation (HCC) Long term (current) use of anticoagulants Rate control: Transition Lopressor  to Toprol -XL Thromboembolic prophylaxis: Eliquis  5 mg  p.o. twice daily. EKG illustrates sinus bradycardia without underlying ischemia or injury pattern. Does not endorse evidence of bleeding. Discontinue Lopressor  50 mg p.o. twice daily. Start Toprol -XL 50 mg p.o. daily Patient had labs on Apr 28, 2024.  Hemoglobin and renal function is stable. Refill Eliquis . Will order follow-up labs in June 2026 prior to follow-up. He will have 42-month follow-up labs in December 2025 with PCP and send us  a copy for reference  Benign hypertension Office blood pressures are acceptable but not at goal. Recommended that he start checking his blood pressures at home to see if further medication titration is needed.  Follows with PCP.  Mixed hyperlipidemia Currently on Crestor  20 mg p.o. daily.   He denies myalgia or other side effects. Most recent lipids dated November 29, 2023 [facility] from Care Everywhere, independently reviewed as noted above.  LDL is 51 mg/dL.  Will have repeat labs in December 2025. Cardiology is following peripherally.   Type 2 diabetes mellitus with other circulatory complication, without long-term current use of insulin  (HCC) Hemoglobin A1c slowly improving, was 9.0 And now 8.0 as of May 31, 2024 (labs in Care Everywhere) Consider low-dose ARB for renal protection-will defer to PCP Continue statin therapy-LDL at goal.   Orders Placed:  Orders Placed This Encounter  Procedures   Basic metabolic panel with GFR    Standing Status:   Future    Number of Occurrences:   1    Expected Date:   04/28/2025    Expiration Date:   07/27/2025   Hemoglobin and hematocrit, blood    Standing Status:   Future    Number of Occurrences:   1    Expected Date:   04/28/2025    Expiration Date:   07/27/2025   EKG 12-Lead    Final Medication List:    Meds ordered this encounter  Medications   metoprolol  succinate (TOPROL -XL) 50 MG 24 hr tablet    Sig: Take 1 tablet (50 mg total) by mouth daily. HOLD if systolic blood pressure (top number) is  less than 100 and/ or heart rate  is less than 55.    Dispense:  90 tablet    Refill:  3   apixaban  (ELIQUIS ) 5 MG TABS tablet    Sig: Take 1 tablet (5 mg total) by mouth 2 (two) times daily.    Dispense:  180 tablet    Refill:  3    Medications Discontinued During This Encounter  Medication Reason   metoprolol  tartrate (LOPRESSOR ) 50 MG tablet Change in therapy   apixaban  (ELIQUIS ) 5 MG TABS tablet Reorder   Insulin  Pen Needle 32G X 4 MM MISC Patient Preference     Current Outpatient Medications:    Ascorbic Acid  (VITAMIN C) 1000 MG tablet, Take 1,000 mg by mouth daily., Disp: , Rfl:    blood glucose meter kit and supplies, Dispense based on patient and insurance preference. Use up to four times daily as directed. (FOR ICD-10 E10.9, E11.9)., Disp: 1 each, Rfl: 0   cholecalciferol  (VITAMIN D3) 25 MCG (1000 UNIT) tablet, Take 2,000 Units by mouth daily., Disp: , Rfl:    citalopram  (CELEXA ) 20 MG tablet, Take 20 mg by mouth daily., Disp: , Rfl:    glipiZIDE (GLUCOTROL XL) 2.5 MG 24 hr tablet, Take 2.5 mg by mouth daily with breakfast., Disp: , Rfl:    glucose blood (FREESTYLE TEST STRIPS) test strip, Use as instructed, Disp: 100 each, Rfl: 0   Lancets (FREESTYLE) lancets, Use as instructed, Disp: 100 each, Rfl: 0   metoprolol  succinate (TOPROL -XL) 50 MG 24 hr tablet, Take 1 tablet (50 mg total) by mouth daily. HOLD if systolic blood pressure (top number) is less than 100 and/ or heart rate is less than 55., Disp: 90 tablet, Rfl: 3   Multiple Vitamins-Minerals (ADULT GUMMY PO), Take 2 capsules by mouth daily., Disp: , Rfl:    nitroGLYCERIN  (NITROSTAT ) 0.4 MG SL tablet, Place 1 tablet (0.4 mg total) under the tongue every 5 (five) minutes as needed for chest pain. If you require more than two tablets five minutes apart go to the nearest ER via EMS., Disp: 30 tablet, Rfl: 0   rosuvastatin  (CRESTOR ) 20 MG tablet, Take 1 tablet (20 mg total) by mouth daily., Disp: 90 tablet, Rfl: 3   apixaban   (ELIQUIS ) 5 MG TABS tablet, Take 1 tablet (5 mg total) by mouth 2 (two) times daily., Disp: 180 tablet, Rfl: 3  Consent:   NA  Disposition:   June 2026  His questions and concerns were addressed to his satisfaction. He voices understanding of the recommendations provided during this encounter.    Signed, Madonna Michele HAS, [facility]  A Division of [facility] [address], [place], [place] [address]  [place], [place] [address]  07/26/2024 6:02 PM

## 2024-10-09 ENCOUNTER — Other Ambulatory Visit: Payer: Self-pay | Admitting: Cardiology

## 2024-10-09 DIAGNOSIS — E1159 Type 2 diabetes mellitus with other circulatory complications: Secondary | ICD-10-CM
